# Patient Record
Sex: Female | Born: 1940
Health system: Southern US, Community
[De-identification: ages and names within clinical notes are randomized; demographics above are authoritative.]

## PROBLEM LIST (undated history)

## (undated) DIAGNOSIS — C801 Malignant (primary) neoplasm, unspecified: Secondary | ICD-10-CM

## (undated) DIAGNOSIS — K219 Gastro-esophageal reflux disease without esophagitis: Secondary | ICD-10-CM

## (undated) DIAGNOSIS — M1990 Unspecified osteoarthritis, unspecified site: Secondary | ICD-10-CM

## (undated) DIAGNOSIS — I10 Essential (primary) hypertension: Secondary | ICD-10-CM

## (undated) DIAGNOSIS — M419 Scoliosis, unspecified: Secondary | ICD-10-CM

## (undated) DIAGNOSIS — T7840XA Allergy, unspecified, initial encounter: Secondary | ICD-10-CM

## (undated) DIAGNOSIS — M810 Age-related osteoporosis without current pathological fracture: Secondary | ICD-10-CM

## (undated) DIAGNOSIS — I2510 Atherosclerotic heart disease of native coronary artery without angina pectoris: Secondary | ICD-10-CM

## (undated) DIAGNOSIS — N320 Bladder-neck obstruction: Secondary | ICD-10-CM

## (undated) DIAGNOSIS — K5730 Diverticulosis of large intestine without perforation or abscess without bleeding: Secondary | ICD-10-CM

## (undated) DIAGNOSIS — E785 Hyperlipidemia, unspecified: Secondary | ICD-10-CM

## (undated) DIAGNOSIS — D649 Anemia, unspecified: Secondary | ICD-10-CM

## (undated) DIAGNOSIS — H269 Unspecified cataract: Secondary | ICD-10-CM

## (undated) DIAGNOSIS — M8580 Other specified disorders of bone density and structure, unspecified site: Secondary | ICD-10-CM

## (undated) HISTORY — DX: Scoliosis, unspecified: M41.9

## (undated) HISTORY — DX: Hyperlipidemia, unspecified: E78.5

## (undated) HISTORY — DX: Diverticulosis of large intestine without perforation or abscess without bleeding: K57.30

## (undated) HISTORY — DX: Malignant (primary) neoplasm, unspecified: C80.1

## (undated) HISTORY — DX: Essential (primary) hypertension: I10

## (undated) HISTORY — DX: Atherosclerotic heart disease of native coronary artery without angina pectoris: I25.10

## (undated) HISTORY — DX: Allergy, unspecified, initial encounter: T78.40XA

## (undated) HISTORY — DX: Unspecified cataract: H26.9

## (undated) HISTORY — DX: Gastro-esophageal reflux disease without esophagitis: K21.9

## (undated) HISTORY — DX: Unspecified osteoarthritis, unspecified site: M19.90

## (undated) HISTORY — DX: Other specified disorders of bone density and structure, unspecified site: M85.80

## (undated) HISTORY — DX: Anemia, unspecified: D64.9

## (undated) HISTORY — DX: Age-related osteoporosis without current pathological fracture: M81.0

## (undated) HISTORY — PX: UPPER GASTROINTESTINAL ENDOSCOPY: SHX188

---

## 1969-04-04 HISTORY — PX: VARICOSE VEIN SURGERY: SHX832

## 2000-05-23 ENCOUNTER — Other Ambulatory Visit: Admission: RE | Admit: 2000-05-23 | Discharge: 2000-05-23 | Payer: Self-pay | Admitting: *Deleted

## 2000-06-14 ENCOUNTER — Encounter: Admission: RE | Admit: 2000-06-14 | Discharge: 2000-06-14 | Payer: Self-pay | Admitting: *Deleted

## 2000-06-14 ENCOUNTER — Encounter: Payer: Self-pay | Admitting: *Deleted

## 2000-11-27 ENCOUNTER — Emergency Department (HOSPITAL_COMMUNITY): Admission: EM | Admit: 2000-11-27 | Discharge: 2000-11-27 | Payer: Self-pay | Admitting: Emergency Medicine

## 2003-02-18 ENCOUNTER — Other Ambulatory Visit: Admission: RE | Admit: 2003-02-18 | Discharge: 2003-02-18 | Payer: Self-pay | Admitting: Obstetrics and Gynecology

## 2003-03-11 ENCOUNTER — Encounter: Admission: RE | Admit: 2003-03-11 | Discharge: 2003-03-11 | Payer: Self-pay | Admitting: Internal Medicine

## 2003-03-14 ENCOUNTER — Encounter: Payer: Self-pay | Admitting: Internal Medicine

## 2004-01-26 ENCOUNTER — Encounter: Admission: RE | Admit: 2004-01-26 | Discharge: 2004-01-26 | Payer: Self-pay | Admitting: Internal Medicine

## 2004-01-29 ENCOUNTER — Ambulatory Visit: Payer: Self-pay | Admitting: Internal Medicine

## 2005-01-25 ENCOUNTER — Other Ambulatory Visit: Admission: RE | Admit: 2005-01-25 | Discharge: 2005-01-25 | Payer: Self-pay | Admitting: Obstetrics and Gynecology

## 2005-01-26 ENCOUNTER — Ambulatory Visit: Payer: Self-pay | Admitting: Internal Medicine

## 2005-03-02 ENCOUNTER — Ambulatory Visit: Payer: Self-pay | Admitting: Internal Medicine

## 2005-04-04 ENCOUNTER — Encounter (INDEPENDENT_AMBULATORY_CARE_PROVIDER_SITE_OTHER): Payer: Self-pay | Admitting: *Deleted

## 2005-04-04 HISTORY — PX: COLONOSCOPY: SHX174

## 2005-04-04 LAB — CONVERTED CEMR LAB

## 2005-06-01 ENCOUNTER — Ambulatory Visit: Payer: Self-pay | Admitting: Internal Medicine

## 2005-06-14 ENCOUNTER — Ambulatory Visit: Payer: Self-pay | Admitting: Internal Medicine

## 2005-06-28 ENCOUNTER — Ambulatory Visit: Payer: Self-pay | Admitting: Internal Medicine

## 2006-02-08 ENCOUNTER — Ambulatory Visit: Payer: Self-pay | Admitting: Internal Medicine

## 2006-02-08 LAB — CONVERTED CEMR LAB
ALT: 13 units/L (ref 0–40)
AST: 27 units/L (ref 0–37)
Albumin: 4.4 g/dL (ref 3.5–5.2)
Alkaline Phosphatase: 54 units/L (ref 39–117)
BUN: 16 mg/dL (ref 6–23)
Basophils Absolute: 0 10*3/uL (ref 0.0–0.1)
Basophils Relative: 0.9 % (ref 0.0–1.0)
CO2: 29 meq/L (ref 19–32)
Calcium: 9.5 mg/dL (ref 8.4–10.5)
Chloride: 105 meq/L (ref 96–112)
Chol/HDL Ratio, serum: 4.4
Cholesterol: 248 mg/dL (ref 0–200)
Creatinine, Ser: 0.9 mg/dL (ref 0.4–1.2)
Eosinophil percent: 2 % (ref 0.0–5.0)
GFR calc non Af Amer: 67 mL/min
Glomerular Filtration Rate, Af Am: 81 mL/min/{1.73_m2}
Glucose, Bld: 96 mg/dL (ref 70–99)
HCT: 40.5 % (ref 36.0–46.0)
HDL: 56.2 mg/dL (ref >39.0)
Hemoglobin: 13.6 g/dL (ref 12.0–15.0)
Hgb A1c MFr Bld: 5.4 % (ref 4.6–6.0)
LDL DIRECT: 174.1 mg/dL
Lymphocytes Relative: 24.3 % (ref 12.0–46.0)
MCHC: 33.6 g/dL (ref 30.0–36.0)
MCV: 90.5 fL (ref 78.0–100.0)
Monocytes Absolute: 0.4 10*3/uL (ref 0.2–0.7)
Monocytes Relative: 9 % (ref 3.0–11.0)
Neutro Abs: 2.9 10*3/uL (ref 1.4–7.7)
Neutrophils Relative %: 63.8 % (ref 43.0–77.0)
Platelets: 278 10*3/uL (ref 150–400)
Potassium: 4.6 meq/L (ref 3.5–5.1)
RBC: 4.48 M/uL (ref 3.87–5.11)
RDW: 12.5 % (ref 11.5–14.6)
Sodium: 139 meq/L (ref 135–145)
TSH: 1.69 microintl units/mL (ref 0.35–5.50)
Total Bilirubin: 1 mg/dL (ref 0.3–1.2)
Total Protein: 7.5 g/dL (ref 6.0–8.3)
Triglyceride fasting, serum: 75 mg/dL (ref 0–149)
VLDL: 15 mg/dL (ref 0–40)
WBC: 4.5 10*3/uL (ref 4.5–10.5)

## 2006-02-09 ENCOUNTER — Ambulatory Visit: Payer: Self-pay | Admitting: Internal Medicine

## 2007-05-10 ENCOUNTER — Encounter (INDEPENDENT_AMBULATORY_CARE_PROVIDER_SITE_OTHER): Payer: Self-pay | Admitting: *Deleted

## 2007-05-10 DIAGNOSIS — M810 Age-related osteoporosis without current pathological fracture: Secondary | ICD-10-CM | POA: Insufficient documentation

## 2007-05-10 DIAGNOSIS — M8580 Other specified disorders of bone density and structure, unspecified site: Secondary | ICD-10-CM

## 2007-05-15 ENCOUNTER — Ambulatory Visit: Payer: Self-pay | Admitting: Internal Medicine

## 2007-05-15 DIAGNOSIS — E785 Hyperlipidemia, unspecified: Secondary | ICD-10-CM

## 2007-05-24 ENCOUNTER — Encounter (INDEPENDENT_AMBULATORY_CARE_PROVIDER_SITE_OTHER): Payer: Self-pay | Admitting: *Deleted

## 2007-08-28 ENCOUNTER — Encounter (INDEPENDENT_AMBULATORY_CARE_PROVIDER_SITE_OTHER): Payer: Self-pay | Admitting: *Deleted

## 2007-08-28 ENCOUNTER — Ambulatory Visit: Payer: Self-pay | Admitting: Internal Medicine

## 2007-08-28 LAB — CONVERTED CEMR LAB
OCCULT 2: NEGATIVE
OCCULT 3: NEGATIVE

## 2008-04-04 HISTORY — PX: CATARACT EXTRACTION: SUR2

## 2008-05-19 ENCOUNTER — Ambulatory Visit: Payer: Self-pay | Admitting: Internal Medicine

## 2008-05-19 DIAGNOSIS — K5730 Diverticulosis of large intestine without perforation or abscess without bleeding: Secondary | ICD-10-CM

## 2008-05-26 ENCOUNTER — Encounter (INDEPENDENT_AMBULATORY_CARE_PROVIDER_SITE_OTHER): Payer: Self-pay | Admitting: *Deleted

## 2009-02-25 ENCOUNTER — Encounter: Payer: Self-pay | Admitting: Internal Medicine

## 2009-05-21 ENCOUNTER — Ambulatory Visit: Payer: Self-pay | Admitting: Internal Medicine

## 2009-05-21 DIAGNOSIS — M2550 Pain in unspecified joint: Secondary | ICD-10-CM

## 2009-07-13 ENCOUNTER — Ambulatory Visit: Payer: Self-pay | Admitting: Internal Medicine

## 2010-05-02 LAB — CONVERTED CEMR LAB
ALT: 12 units/L (ref 0–35)
AST: 25 units/L (ref 0–37)
AST: 27 units/L (ref 0–37)
AST: 27 units/L (ref 0–37)
Albumin: 4.2 g/dL (ref 3.5–5.2)
Albumin: 4.7 g/dL (ref 3.5–5.2)
Alkaline Phosphatase: 53 units/L (ref 39–117)
Alkaline Phosphatase: 64 units/L (ref 39–117)
BUN: 13 mg/dL (ref 6–23)
BUN: 15 mg/dL (ref 6–23)
Basophils Absolute: 0 10*3/uL (ref 0.0–0.1)
Basophils Relative: 0.2 % (ref 0.0–3.0)
Basophils Relative: 0.4 % (ref 0.0–3.0)
Bilirubin, Direct: 0.1 mg/dL (ref 0.0–0.3)
CO2: 27 meq/L (ref 19–32)
CO2: 28 meq/L (ref 19–32)
Calcium: 10.3 mg/dL (ref 8.4–10.5)
Calcium: 9.3 mg/dL (ref 8.4–10.5)
Calcium: 9.4 mg/dL (ref 8.4–10.5)
Chloride: 107 meq/L (ref 96–112)
Chloride: 108 meq/L (ref 96–112)
Cholesterol, target level: 200 mg/dL
Cholesterol, target level: 200 mg/dL
Cholesterol: 172 mg/dL (ref 0–200)
Cholesterol: 199 mg/dL (ref 0–200)
Creatinine, Ser: 0.8 mg/dL (ref 0.4–1.2)
Creatinine, Ser: 0.9 mg/dL (ref 0.4–1.2)
Creatinine, Ser: 0.9 mg/dL (ref 0.4–1.2)
Eosinophils Absolute: 0.1 10*3/uL (ref 0.0–0.6)
Eosinophils Absolute: 0.1 10*3/uL (ref 0.0–0.7)
Eosinophils Relative: 2.8 % (ref 0.0–5.0)
GFR calc non Af Amer: 66.22 mL/min (ref >60)
GFR calc non Af Amer: 77 mL/min
Glucose, Bld: 96 mg/dL (ref 70–99)
HCT: 39.4 % (ref 36.0–46.0)
HDL goal, serum: 40 mg/dL
HDL goal, serum: 50 mg/dL
HDL: 54.1 mg/dL (ref >39.0)
Hemoglobin: 14.4 g/dL (ref 12.0–15.0)
LDL Cholesterol: 114 mg/dL — ABNORMAL HIGH (ref 0–99)
LDL Cholesterol: 97 mg/dL (ref 0–99)
LDL Goal: 110 mg/dL
LDL Goal: 160 mg/dL
Lymphocytes Relative: 22.5 % (ref 12.0–46.0)
Lymphocytes Relative: 22.8 % (ref 12.0–46.0)
MCHC: 32.7 g/dL (ref 30.0–36.0)
MCHC: 33.5 g/dL (ref 30.0–36.0)
MCV: 90.5 fL (ref 78.0–100.0)
Monocytes Relative: 8.6 % (ref 3.0–11.0)
Monocytes Relative: 9.1 % (ref 3.0–12.0)
Neutro Abs: 3.4 10*3/uL (ref 1.4–7.7)
Neutrophils Relative %: 65.6 % (ref 43.0–77.0)
Neutrophils Relative %: 65.8 % (ref 43.0–77.0)
Platelets: 249 10*3/uL (ref 150–400)
RBC: 4.34 M/uL (ref 3.87–5.11)
RBC: 4.36 M/uL (ref 3.87–5.11)
RBC: 4.66 M/uL (ref 3.87–5.11)
RDW: 12.3 % (ref 11.5–14.6)
Sodium: 143 meq/L (ref 135–145)
Sodium: 146 meq/L — ABNORMAL HIGH (ref 135–145)
TSH: 1.22 microintl units/mL (ref 0.35–5.50)
TSH: 1.55 microintl units/mL (ref 0.35–5.50)
Total Bilirubin: 0.6 mg/dL (ref 0.3–1.2)
Total CHOL/HDL Ratio: 3.2
Total Protein: 7.6 g/dL (ref 6.0–8.3)
Total Protein: 7.8 g/dL (ref 6.0–8.3)
Triglycerides: 106 mg/dL (ref 0–149)
VLDL: 23 mg/dL (ref 0–40)
WBC: 4.2 10*3/uL — ABNORMAL LOW (ref 4.5–10.5)
WBC: 5 10*3/uL (ref 4.5–10.5)

## 2010-05-04 NOTE — Assessment & Plan Note (Signed)
Summary: severe congestion//lch   Vital Signs:  Patient profile:   70 year old female Weight:      140.2 pounds Temp:     99.1 degrees F Pulse rate:   84 / minute Resp:     16 per minute BP sitting:   130 / 78  (left arm) Cuff size:   regular  Vitals Entered By: Shonna Chock (July 13, 2009 12:27 PM) CC: Congestion x 10days Comments REVIEWED MED LIST, PATIENT AGREED DOSE AND INSTRUCTION CORRECT    CC:  Congestion x 10days.  History of Present Illness: Onset  07/03/2009 as tickle in throat followed  by laryngitis & upper chest , throat & head congestion. Rx: OTC Dayquil, Nyquil Cold & Flu . She had flu shot.  Allergies (verified): No Known Drug Allergies  Review of Systems General:  Complains of chills, fever, and sweats. ENT:  Complains of nasal congestion and sinus pressure; denies ear discharge, earache, and sore throat; No frontal headache or facial pain; some yellow from nose. Resp:  Complains of shortness of breath and wheezing; denies chest pain with inspiration, coughing up blood, and sputum productive; No PMH of asthma or smoking. Allergy:  Denies itching eyes and sneezing.  Physical Exam  General:  Appeara tired but well-nourished,in no acute distress; alert,appropriate and cooperative throughout examination Ears:  External ear exam shows no significant lesions or deformities.  Otoscopic examination reveals clear canals, tympanic membranes are intact bilaterally without bulging, retraction, inflammation or discharge. Hearing is grossly normal bilaterally. Nose:  External nasal examination shows no deformity or inflammation. Nasal mucosa are pink and moist without lesions or exudates. Mouth:  Oral mucosa and oropharynx without lesions or exudates.  Teeth in good repair. Lungs:  Normal respiratory effort, chest expands symmetrically. Lungs : musical rhonchi & expiratory wheezes Heart:  Normal rate and regular rhythm. S1 and S2 normal without gallop, murmur, click, rub  .S4 Cervical Nodes:  No lymphadenopathy noted Axillary Nodes:  No palpable lymphadenopathy   Impression & Recommendations:  Problem # 1:  BRONCHITIS-ACUTE (ICD-466.0)  RAD component present  Her updated medication list for this problem includes:    Amoxicillin-pot Clavulanate 875-125 Mg Tabs (Amoxicillin-pot clavulanate) .Marland Kitchen... 1 every 12 hrs with a meal  Problem # 2:  URI (ICD-465.9)  Complete Medication List: 1)  Multivitamins Tabs (Multiple vitamin) .... Take 1 tablet by mouth once a day 2)  Calcium 1500mg  W/vitamin D 400-800 Iu  .... Take as directed 3)  Black Currant Seed  .... Once daily 4)  Amoxicillin-pot Clavulanate 875-125 Mg Tabs (Amoxicillin-pot clavulanate) .Marland Kitchen.. 1 every 12 hrs with a meal 5)  Prednisone 20 Mg Tabs (Prednisone) .Marland Kitchen.. 1 two times a day with food  Patient Instructions: 1)  Use samples of Advair as prescribed ; 1 inhalation every 12 hrs . Gargle & spit after use.Chest Xray Weds if no better.Neti pot daily as needed for head congestion. 2)  Drink as much fluid as you can tolerate for the next few days. Prescriptions: PREDNISONE 20 MG TABS (PREDNISONE) 1 two times a day with food  #14 x 0   Entered and Authorized by:   Marga Melnick MD   Signed by:   Marga Melnick MD on 07/13/2009   Method used:   Faxed to ...       [facility] [phone]* (retail)       [address] [place], [place]  [address]       Ph: [phone]       Fax: [phone]   RxID:   [id number] AMOXICILLIN-POT CLAVULANATE 875-125 MG TABS (AMOXICILLIN-POT CLAVULANATE) 1 every 12 hrs WITH a meal  #20 x 0   Entered and Authorized by:   Marga Melnick MD   Signed by:   Marga Melnick MD on 07/13/2009   Method used:   Faxed to ...       [facility] [phone]* (retail)       [address] [place], [place]  [address]       Ph: [phone]       Fax: [phone]   RxID:   [id number]

## 2010-05-04 NOTE — Assessment & Plan Note (Signed)
Summary: yearly check/cbs - n/s   Vital Signs:  Patient profile:   70 year old female Height:      65 inches Weight:      141 pounds BMI:     23.55 Temp:     98.6 degrees F oral Pulse rate:   65 / minute Resp:     14 per minute BP sitting:   122 / 70  (left arm) Cuff size:   large  Vitals Entered By: Shonna Chock (May 21, 2009 8:35 AM)  Comments REVIEWED MED LIST, PATIENT AGREED DOSE AND INSTRUCTION CORRECT    History of Present Illness: Carleah D/Ced Pravastatin in 05/2008 due to myalgias ; a friend had ALS & was on a statin. She is on a low fat, sugar & starch diet.  Lipid Management History:      Positive NCEP/ATP III risk factors include female age 74 years old or older.  Negative NCEP/ATP III risk factors include no history of early menopause without estrogen hormone replacement, non-diabetic, HDL cholesterol greater than 60, no family history for ischemic heart disease, non-tobacco-user status, non-hypertensive, no ASHD (atherosclerotic heart disease), no prior stroke/TIA, no peripheral vascular disease, and no history of aortic aneurysm.     Allergies (verified): No Known Drug Allergies  Past History:  Past Medical History: Hyperlipidemia: NMR 2004: LDL 160(1861/981),HDL 46, TG 131. LDL goal = < 110 Osteopenia(last BMD 2009 by Dr Arelia Sneddon, due ); herpes zoster 1997 L flank Diverticulosis, colon  Past Surgical History: Vein stripping (1971) G5 P4 A1 Colonoscopy : Diverticulosis 06-28-2005; due 2017 Cataract extraction OD 02/2009, Dr Elmer Picker  Family History: Father:Esophageal  CA, smoker (died @ 22) Mother: CHF, diverticulosis, arthritis, skin CA,died of CVA @ 37 Siblings: bro excess tobacco & alcohol; P uncle pancreatic CA P Grandmother:  CVA (died @ 57), pre-DM, DVT MGM:  CA, ? stomach; M uncle MI > 16 1st Cousin:  RA Grandson:  ?" hole in heart" Cousin:  Thalassemia  Social History: Never Smoked; Married Alcohol use-yes: socially  Regular exercise-yes:  walks golf course  5X /week  Review of Systems  The patient denies anorexia, fever, weight loss, weight gain, decreased hearing, hoarseness, chest pain, syncope, dyspnea on exertion, peripheral edema, prolonged cough, headaches, hemoptysis, abdominal pain, melena, hematochezia, severe indigestion/heartburn, hematuria, incontinence, suspicious skin lesions, depression, unusual weight change, abnormal bleeding, enlarged lymph nodes, and angioedema.         Vision improved with cataract surgery. MS:  Complains of joint pain; denies joint redness, joint swelling, low back pain, mid back pain, and thoracic pain; Intermittent shoulders, elbows , knees, hips ; NSAIDS as needed .  Physical Exam  General:  well-nourished; alert,appropriate and cooperative throughout examination Head:  Normocephalic and atraumatic without obvious abnormalities. Eyes:  No corneal or conjunctival inflammation noted.Perrla. Funduscopic exam benign, without hemorrhages, exudates or papilledema.  Ears:  External ear exam shows no significant lesions or deformities.  Otoscopic examination reveals clear canals, tympanic membranes are intact bilaterally without bulging, retraction, inflammation or discharge. Hearing is grossly normal bilaterally. Nose:  External nasal examination shows no deformity or inflammation. Nasal mucosa are pink and moist without lesions or exudates. Mouth:  Oral mucosa and oropharynx without lesions or exudates.  Teeth in good repair. Neck:  No deformities, masses, or tenderness noted. Lungs:  Normal respiratory effort, chest expands symmetrically. Lungs are clear to auscultation, no crackles or wheezes. Heart:  Normal rate and regular rhythm. S1 and S2 normal without gallop, murmur, click, rub. S4 with slurring  Abdomen:  Bowel sounds positive,abdomen soft and non-tender without masses, organomegaly or hernias noted. Genitalia:  Dr Arelia Sneddon Msk:  No deformity or scoliosis noted of thoracic or lumbar  spine.   Pulses:  R and L carotid,radial,dorsalis pedis and posterior tibial pulses are full and equal bilaterally Extremities:  No clubbing, cyanosis, edema, or deformity noted with normal full range of motion of all joints.   Minor DIP changes & minor crepitus of knees Neurologic:  alert & oriented X3 and DTRs symmetrical and normal.   Skin:  Intact without suspicious lesions or rashes Cervical Nodes:  No lymphadenopathy noted Axillary Nodes:  No palpable lymphadenopathy Psych:  memory intact for recent and remote, normally interactive, and good eye contact.     Impression & Recommendations:  Problem # 1:  PREVENTIVE HEALTH CARE (ICD-V70.0)  Orders: EKG w/ Interpretation (93000) Venipuncture (69629) TLB-BMP (Basic Metabolic Panel-BMET) (80048-METABOL) TLB-CBC Platelet - w/Differential (85025-CBCD) TLB-Hepatic/Liver Function Pnl (80076-HEPATIC) TLB-TSH (Thyroid Stimulating Hormone) (84443-TSH) T-NMR, Lipoprofile (52841-32440) T-Vitamin D (25-Hydroxy) (10272-53664)  Problem # 2:  HYPERLIPIDEMIA (ICD-272.2)  The following medications were removed from the medication list:    Pravastatin Sodium 40 Mg Tabs (Pravastatin sodium) .Marland Kitchen... Take one tablet at bedtime  Orders: EKG w/ Interpretation (93000) Venipuncture (40347) T-NMR, Lipoprofile (42595-63875)  Problem # 3:  ARTHRALGIA (ICD-719.40)  Problem # 4:  OSTEOPENIA (ICD-733.90)  as per Dr Arelia Sneddon  Orders: Venipuncture [phone]) T-Vitamin D (25-Hydroxy) [id number])  Problem # 5:  DIVERTICULOSIS, COLON (ICD-562.10) as per GI; FOB done @ Gyn negative by history  Complete Medication List: 1)  Multivitamins Tabs (Multiple vitamin) .... Take 1 tablet by mouth once a day 2)  Calcium 1500mg  W/vitamin D 400-800 Iu  .... Take as directed 3)  Black Currant Seed  .... Once daily  Lipid Assessment/Plan:      Based on NCEP/ATP III, the patient's risk factor category is "0-1 risk factors".  The patient's lipid goals are as follows:  Total cholesterol goal is 200; LDL cholesterol goal is 110; HDL cholesterol goal is 50; Triglyceride goal is 150.  Her LDL cholesterol goal has been met.  Secondary causes for hyperlipidemia have been ruled out.  She has been counseled on adjunctive measures for lowering her cholesterol and has been provided with dietary instructions.    Patient Instructions: 1)  NMR Lipoprofile will optimally assess long term risk

## 2010-05-24 ENCOUNTER — Encounter: Payer: Self-pay | Admitting: Internal Medicine

## 2010-05-24 ENCOUNTER — Other Ambulatory Visit: Payer: Self-pay | Admitting: Internal Medicine

## 2010-05-24 ENCOUNTER — Encounter (INDEPENDENT_AMBULATORY_CARE_PROVIDER_SITE_OTHER): Payer: Medicare Other | Admitting: Internal Medicine

## 2010-05-24 DIAGNOSIS — M899 Disorder of bone, unspecified: Secondary | ICD-10-CM

## 2010-05-24 DIAGNOSIS — K5730 Diverticulosis of large intestine without perforation or abscess without bleeding: Secondary | ICD-10-CM

## 2010-05-24 DIAGNOSIS — R0609 Other forms of dyspnea: Secondary | ICD-10-CM | POA: Insufficient documentation

## 2010-05-24 DIAGNOSIS — R002 Palpitations: Secondary | ICD-10-CM | POA: Insufficient documentation

## 2010-05-24 DIAGNOSIS — Z0000 Encounter for general adult medical examination without abnormal findings: Secondary | ICD-10-CM

## 2010-05-24 DIAGNOSIS — M949 Disorder of cartilage, unspecified: Secondary | ICD-10-CM

## 2010-05-24 DIAGNOSIS — E782 Mixed hyperlipidemia: Secondary | ICD-10-CM

## 2010-05-24 DIAGNOSIS — Z23 Encounter for immunization: Secondary | ICD-10-CM

## 2010-05-24 DIAGNOSIS — E785 Hyperlipidemia, unspecified: Secondary | ICD-10-CM

## 2010-05-24 DIAGNOSIS — D485 Neoplasm of uncertain behavior of skin: Secondary | ICD-10-CM | POA: Insufficient documentation

## 2010-05-24 DIAGNOSIS — R0989 Other specified symptoms and signs involving the circulatory and respiratory systems: Secondary | ICD-10-CM

## 2010-05-24 LAB — LIPID PANEL
HDL: 62.8 mg/dL (ref >39.00)
VLDL: 21.8 mg/dL (ref 0.0–40.0)

## 2010-05-24 LAB — CBC WITH DIFFERENTIAL/PLATELET
Eosinophils Relative: 2.5 % (ref 0.0–5.0)
HCT: 39.9 % (ref 36.0–46.0)
Hemoglobin: 13.6 g/dL (ref 12.0–15.0)
Lymphs Abs: 0.8 10*3/uL (ref 0.7–4.0)
Monocytes Relative: 7.5 % (ref 3.0–12.0)
Neutro Abs: 4 10*3/uL (ref 1.4–7.7)
WBC: 5.4 10*3/uL (ref 4.5–10.5)

## 2010-05-24 LAB — TSH: TSH: 1.75 u[IU]/mL (ref 0.35–5.50)

## 2010-05-24 LAB — HEPATIC FUNCTION PANEL
ALT: 12 U/L (ref 0–35)
AST: 21 U/L (ref 0–37)
Albumin: 4.5 g/dL (ref 3.5–5.2)
Total Bilirubin: 0.6 mg/dL (ref 0.3–1.2)

## 2010-05-24 LAB — BASIC METABOLIC PANEL
GFR: 67.75 mL/min (ref >60.00)
Potassium: 4.9 mEq/L (ref 3.5–5.1)
Sodium: 142 mEq/L (ref 135–145)

## 2010-06-01 ENCOUNTER — Encounter: Payer: Self-pay | Admitting: Cardiology

## 2010-06-01 ENCOUNTER — Encounter: Payer: Self-pay | Admitting: Physician Assistant

## 2010-06-01 ENCOUNTER — Encounter (INDEPENDENT_AMBULATORY_CARE_PROVIDER_SITE_OTHER): Payer: Medicare Other | Admitting: Physician Assistant

## 2010-06-01 DIAGNOSIS — R9431 Abnormal electrocardiogram [ECG] [EKG]: Secondary | ICD-10-CM | POA: Insufficient documentation

## 2010-06-01 DIAGNOSIS — R0602 Shortness of breath: Secondary | ICD-10-CM

## 2010-06-01 NOTE — Assessment & Plan Note (Signed)
Summary: cpx/kn   Vital Signs:  Patient profile:   70 year old female Height:      64.75 inches Weight:      142 pounds BMI:     23.90 Temp:     97.7 degrees F oral Pulse rate:   72 / minute Resp:     14 per minute BP sitting:   116 / 78  (left arm) Cuff size:   large  Vitals Entered By: Shonna Chock CMA (May 24, 2010 9:38 AM) CC: CPX with fasting labs , Lipid Management  Vision Screening:Left eye w/o correction: 20 / 40 Right Eye w/o correction: 20 / 30 Both eyes w/o correction:  20/ 30       Vision Comments: Implant-right eye   Vision Entered By: Shonna Chock CMA (May 24, 2010 9:36 AM)   CC:  CPX with fasting labs  and Lipid Management.  History of Present Illness: Here for Medicare AWV: 1.Risk factors based on Past M, S, F history:see Diagnoses ; chart updated 2.Physical Activities: walks golf course 2-5X/week 3.Depression/mood: no issues 4.Hearing: whisper heard @ 6 ft 5.ADL's: no limitations 6.Fall Risk: no issues 7.Home Safety: safety proofed 8.Height, weight, &visual acuity:see VS 9.Counseling: POA & Living Will in place 10.Labs ordered based on risk factors: see Orders 11. Referral Coordination: mammograms & BMD up to date; Flu in Fall 12. Care Plan: see Instructions 13.Cognitive Assessment: Oriented X 3 ; memory & recall  excellent   ; "WORLD" spelled backwards; mood & affect normal.    Hyperlipidemia Follow-Up:   She notes  dypsnea and palpitations going up hill on the golf course.  The patient denies the following symptoms: chest pain/pressure, exercise intolerance, syncope, and pedal edema.  Dietary compliance has been good.  Adjunctive measures currently used by the patient include fiber, folic acid, and niacin.  She had arm pain on Pravastatin.  Lipid Management History:      Positive NCEP/ATP III risk factors include female age 85 years old or older.  Negative NCEP/ATP III risk factors include no history of early menopause without estrogen  hormone replacement, non-diabetic, HDL cholesterol greater than 60, no family history for ischemic heart disease, non-tobacco-user status, non-hypertensive, no ASHD (atherosclerotic heart disease), no prior stroke/TIA, no peripheral vascular disease, and no history of aortic aneurysm.     Preventive Screening-Counseling & Management  Alcohol-Tobacco     Alcohol drinks/day: 0     Smoking Status: never  Caffeine-Diet-Exercise     Caffeine use/day: 3 glasses of tea  Hep-HIV-STD-Contraception     Dental Visit-last 6 months yes     Sun Exposure-Excessive: no  Safety-Violence-Falls     Seat Belt Use: yes     Smoke Detectors: yes      Blood Transfusions:  no.        Travel History:  11/ 2011 [place].    Allergies (verified): 1)  ! Pravastatin Sodium (Pravastatin Sodium)  Past History:  Past Medical History: Hyperlipidemia: NMR 2004: LDL 160 (1861/981),HDL 46, TG 131. LDL goal = < 120. Framingham Study LDL goal = < 160. Osteopenia (last BMD 2011 by Dr Arelia Sneddon ); herpes zoster 1997 L flank Diverticulosis, colon  Past Surgical History: Vein stripping (1971) G5 P4 A1 Colonoscopy : Diverticulosis 06/2005; due 2017 Cataract extraction OD 02/2009, Dr Elmer Picker  Family History: Father:Esophageal  cancer , smoker (died @ 64) Mother: CHF, diverticulosis, arthritis, skin cancer ,died of CVA @ 72 Siblings: bro ::excess tobacco & alcohol; P uncle pancreatic cancer P Grandmother:  CVA (died @ 32), pre-DM, DVT MGM:  cancer , ? stomach; M uncle: MI > 15 1st Cousin:  RA Grandson:?" hole in heart" Cousin:  Thalassemia  Social History: Never Smoked; Married Alcohol use-yes: extremely rarely  Regular exercise-yes: walks golf course 2- 5X /week(variable due to season) Caffeine use/day:  3 glasses of tea Dental Care w/in 6 mos.:  yes Sun Exposure-Excessive:  no Seat Belt Use:  yes Blood Transfusions:  no  Review of Systems       The patient complains of suspicious skin lesions.   The patient denies anorexia, fever, weight loss, weight gain, vision loss, decreased hearing, hoarseness, prolonged cough, hemoptysis, melena, hematochezia, severe indigestion/heartburn, hematuria, unusual weight change, abnormal bleeding, enlarged lymph nodes, and angioedema.         Mucus in stool "50%" of time. Intermittent pigmented lesion 3rd R fingernail base; it resolves completely.  Physical Exam  General:  well-nourished,in no acute distress; alert,appropriate and cooperative throughout examination Head:  Normocephalic and atraumatic without obvious abnormalities.  Eyes:  No corneal or conjunctival inflammation noted.Perrla. Funduscopic exam benign, without hemorrhages, exudates or papilledema.  Ears:  External ear exam shows no significant lesions or deformities.  Otoscopic examination reveals clear canals, tympanic membranes are intact bilaterally without bulging, retraction, inflammation or discharge. Hearing is grossly normal bilaterally. Nose:  External nasal examination shows no deformity or inflammation. Nasal mucosa are pink and moist without lesions or exudates. Mouth:  Oral mucosa and oropharynx without lesions or exudates.  Teeth in good repair. Neck:  No deformities, masses, or tenderness noted. Lungs:  Normal respiratory effort, chest expands symmetrically. Lungs are clear to auscultation, no crackles or wheezes. Heart:  Normal rate and regular rhythm. S1 and S2 normal without gallop, murmur, click, rub .S4 Abdomen:  Bowel sounds positive,abdomen soft and non-tender without masses, organomegaly or hernias noted. Genitalia:  Dr Arelia Sneddon Msk:  No deformity or scoliosis noted of thoracic or lumbar spine.   Pulses:  R and L carotid,radial,dorsalis pedis and posterior tibial pulses are full and equal bilaterally Extremities:  No clubbing, cyanosis, edema. Minor OA finger changes; normal full range of motion of all joints.  Punctate pigmented lesion @ 3rd R fingernail  Neurologic:   alert & oriented X3 and DTRs symmetrical and normal.   Skin:  Intact without  rashes; see finger Cervical Nodes:  No lymphadenopathy noted Axillary Nodes:  No palpable lymphadenopathy Psych:  memory intact for recent and remote, normally interactive, and good eye contact.     Impression & Recommendations:  Problem # 1:  PREVENTIVE HEALTH CARE (ICD-V70.0)  Orders: Medicare -1st Annual Wellness Visit [phone])  Problem # 2:  DYSPNEA/SHORTNESS OF BREATH (ICD-786.09)  DOE with # 3  Orders: EKG w/ Interpretation (93000)  Problem # 3:  PALPITATIONS (ICD-785.1)  exertional  Orders: EKG w/ Interpretation (93000)  Problem # 4:  NEOPLASM, SKIN, UNCERTAIN BEHAVIOR (ICD-238.2)  Orders: Dermatology Referral (Derma)  Problem # 5:  HYPERLIPIDEMIA (ICD-272.2)  Orders: EKG w/ Interpretation (93000) Venipuncture (60454) TLB-Lipid Panel (80061-LIPID) TLB-BMP (Basic Metabolic Panel-BMET) (80048-METABOL) TLB-Hepatic/Liver Function Pnl (80076-HEPATIC) Specimen Handling (09811)  Problem # 6:  OSTEOPENIA (ICD-733.90)  Orders: Venipuncture (91478) TLB-TSH (Thyroid Stimulating Hormone) (84443-TSH) T-Vitamin D (25-Hydroxy) (29562-13086) Specimen Handling (57846)  Problem # 7:  DIVERTICULOSIS, COLON (ICD-562.10)  Orders: Venipuncture (96295) TLB-CBC Platelet - w/Differential (85025-CBCD) Specimen Handling (28413)  Complete Medication List: 1)  Multivitamins Tabs (Multiple vitamin) .... Take 1 tablet by mouth once a day 2)  Calcium 1500mg  W/vitamin D 400-800 Iu  .Marland KitchenMarland KitchenMarland Kitchen  Take as directed 3)  Black Currant Seed  .... Once daily  Other Orders: Tdap => 67yrs IM ([address]) Admin 1st Vaccine (60454)  Lipid Assessment/Plan:      Based on NCEP/ATP III, the patient's risk factor category is "0-1 risk factors".  The patient's lipid goals are as follows: Total cholesterol goal is 200; LDL cholesterol goal is 120; HDL cholesterol goal is 50; Triglyceride goal is 150.  Her LDL cholesterol goal has  been met.  Secondary causes for hyperlipidemia have been ruled out.  She has been counseled on adjunctive measures for lowering her cholesterol and has been provided with dietary instructions.    Patient Instructions: 1)  Please see Dr Danella Deis; referral will be made. A stress test may be considered after review of labs & EKG toevaluate the exercise related symptoms   Orders Added: 1)  Medicare -1st Annual Wellness Visit [G0438] 2)  Est. Patient Level III [09811] 3)  EKG w/ Interpretation [93000] 4)  Venipuncture [36415] 5)  TLB-Lipid Panel [80061-LIPID] 6)  TLB-BMP (Basic Metabolic Panel-BMET) [80048-METABOL] 7)  TLB-CBC Platelet - w/Differential [85025-CBCD] 8)  TLB-Hepatic/Liver Function Pnl [80076-HEPATIC] 9)  TLB-TSH (Thyroid Stimulating Hormone) [84443-TSH] 10)  T-Vitamin D (25-Hydroxy) [91478-29562] 11)  Specimen Handling [99000] 12)  Tdap => 56yrs IM [90715] 13)  Admin 1st Vaccine [90471] 14)  Dermatology Referral [Derma]   Immunizations Administered:  Tetanus Vaccine:    Vaccine Type: Tdap    Site: left deltoid    Mfr: GlaxoSmithKline    Dose: 0.5 ml    Route: IM    Given by: Shonna Chock CMA    Exp. Date: 01/22/2012    Lot #: [id number]    VIS given: 02/20/08 version given May 24, 2010.   Immunizations Administered:  Tetanus Vaccine:    Vaccine Type: Tdap    Site: left deltoid    Mfr: GlaxoSmithKline    Dose: 0.5 ml    Route: IM    Given by: Shonna Chock CMA    Exp. Date: 01/22/2012    Lot #: [id number]    VIS given: 02/20/08 version given May 24, 2010.

## 2010-06-01 NOTE — Miscellaneous (Signed)
Summary: Orders Update  Clinical Lists Changes  Orders: Added new Referral order of Misc. Referral (Misc. Ref) - Signed 

## 2010-06-10 ENCOUNTER — Other Ambulatory Visit (INDEPENDENT_AMBULATORY_CARE_PROVIDER_SITE_OTHER): Payer: Medicare Other

## 2010-06-10 ENCOUNTER — Other Ambulatory Visit: Payer: Self-pay | Admitting: Cardiology

## 2010-06-10 ENCOUNTER — Encounter: Payer: Self-pay | Admitting: Cardiology

## 2010-06-10 DIAGNOSIS — R0789 Other chest pain: Secondary | ICD-10-CM

## 2010-06-10 DIAGNOSIS — R079 Chest pain, unspecified: Secondary | ICD-10-CM

## 2010-06-10 LAB — CBC WITH DIFFERENTIAL/PLATELET
Basophils Absolute: 0 10*3/uL (ref 0.0–0.1)
Basophils Relative: 0.6 % (ref 0.0–3.0)
Eosinophils Absolute: 0.2 10*3/uL (ref 0.0–0.7)
Eosinophils Relative: 2.4 % (ref 0.0–5.0)
HCT: 37.7 % (ref 36.0–46.0)
Hemoglobin: 12.9 g/dL (ref 12.0–15.0)
Lymphocytes Relative: 14.3 % (ref 12.0–46.0)
Lymphs Abs: 1 10*3/uL (ref 0.7–4.0)
MCHC: 34.2 g/dL (ref 30.0–36.0)
MCV: 90.5 fl (ref 78.0–100.0)
Monocytes Absolute: 0.5 10*3/uL (ref 0.1–1.0)
Monocytes Relative: 6.5 % (ref 3.0–12.0)
Neutro Abs: 5.3 10*3/uL (ref 1.4–7.7)
Neutrophils Relative %: 76.2 % (ref 43.0–77.0)
Platelets: 251 10*3/uL (ref 150.0–400.0)
RBC: 4.17 Mil/uL (ref 3.87–5.11)
RDW: 13.3 % (ref 11.5–14.6)
WBC: 6.9 10*3/uL (ref 4.5–10.5)

## 2010-06-10 LAB — BASIC METABOLIC PANEL
BUN: 12 mg/dL (ref 6–23)
CO2: 27 mEq/L (ref 19–32)
Calcium: 9.1 mg/dL (ref 8.4–10.5)
Chloride: 108 mEq/L (ref 96–112)
Creatinine, Ser: 0.8 mg/dL (ref 0.4–1.2)
GFR: 72.48 mL/min (ref >60.00)
Glucose, Bld: 90 mg/dL (ref 70–99)
Potassium: 4.4 mEq/L (ref 3.5–5.1)
Sodium: 142 mEq/L (ref 135–145)

## 2010-06-10 LAB — PROTIME-INR: INR: 1 ratio (ref 0.8–1.0)

## 2010-06-10 NOTE — Letter (Signed)
Summary: Cardiac Catheterization Instructions- [facility]  [facility], Main Office  [address]   [place], [place] [address]   Phone: [phone]  Fax: [phone]     06/01/2010 MRN: [id number]  [facility] [address] [place], [place]  [address]  [place]  Dear Ms. Braaksma,   You are scheduled for a Cardiac Catheterization on Tuesday March 13,2012 with Dr.Jeyden Coffelt Shirlee Latch.  Please arrive to the 1st floor of the [facility] and V[facility] Center at [facility] at 8:30 am  on the day of your procedure. Please do not arrive before 6:30 a.m. Call the Heart and Vascular Center at [phone] if you are unable to make your appointmnet. The Code to get into the parking garage under the building is 3000. Take the elevators to the 1st floor. You must have someone to drive you home. Someone must be with you for the first 24 hours after you arrive home. Please wear clothes that are easy to get on and off and wear slip-on shoes. Do not eat or drink after midnight except water with your medications that morning. Bring all your medications and current insurance cards with you.   COME TO THE  [facility] OFFICE FOR LAB THURSDAY MARCH [phone]. THE LAB OPENS AT 8:30AM.  _x__ Make sure you take your aspirin.  _x__ You may take ALL of your medications with water that morning.   The usual length of stay after your procedure is 2 to 3 hours. This can vary.  If you have any questions, please call the office at the number listed above.   Katina Dung, RN, BSN

## 2010-06-15 ENCOUNTER — Inpatient Hospital Stay (HOSPITAL_BASED_OUTPATIENT_CLINIC_OR_DEPARTMENT_OTHER)
Admission: RE | Admit: 2010-06-15 | Discharge: 2010-06-15 | Disposition: A | Discharge status: Home / Self Care | Payer: Medicare Other | Source: Ambulatory Visit | Attending: Cardiology | Admitting: Cardiology

## 2010-06-15 DIAGNOSIS — R0602 Shortness of breath: Secondary | ICD-10-CM | POA: Insufficient documentation

## 2010-06-15 DIAGNOSIS — I2510 Atherosclerotic heart disease of native coronary artery without angina pectoris: Secondary | ICD-10-CM

## 2010-06-15 DIAGNOSIS — R0609 Other forms of dyspnea: Secondary | ICD-10-CM | POA: Insufficient documentation

## 2010-06-15 DIAGNOSIS — E785 Hyperlipidemia, unspecified: Secondary | ICD-10-CM | POA: Insufficient documentation

## 2010-06-15 DIAGNOSIS — R0989 Other specified symptoms and signs involving the circulatory and respiratory systems: Secondary | ICD-10-CM | POA: Insufficient documentation

## 2010-06-16 ENCOUNTER — Encounter: Payer: Self-pay | Admitting: Cardiology

## 2010-06-16 ENCOUNTER — Telehealth: Payer: Self-pay | Admitting: Cardiology

## 2010-06-17 ENCOUNTER — Encounter: Payer: Self-pay | Admitting: Cardiology

## 2010-06-17 ENCOUNTER — Ambulatory Visit (INDEPENDENT_AMBULATORY_CARE_PROVIDER_SITE_OTHER): Payer: Medicare Other | Admitting: Cardiology

## 2010-06-17 DIAGNOSIS — I2510 Atherosclerotic heart disease of native coronary artery without angina pectoris: Secondary | ICD-10-CM | POA: Insufficient documentation

## 2010-06-17 NOTE — Procedures (Signed)
[name], [name]                 ACCOUNT NO.:  [id number]  MEDICAL RECORD NO.:  [id number]           PATIENT TYPE:  O  LOCATION:  CATH                         FACILITY:  [facility]  PHYSICIAN:  Marca Ancona, MD      DATE OF BIRTH:  23-Jul-1940  DATE OF PROCEDURE:  06/15/2010 DATE OF DISCHARGE:                           CARDIAC CATHETERIZATION   PROCEDURES: 1. Left heart catheterization. 2. Coronary angiography. 3. Left ventriculography.  OPERATOR:  Marca Ancona, MD  INDICATIONS:  This is a 70 year old who has a history of hyperlipidemia. For the last couple of months, she has noted decreased stamina.  She gets short of breath walking [place].  No rest symptoms.  No true chest pain.  The patient did have an exercise treadmill test done.  This was abnormal showing ST depression in multiple leads as well as a hypertensive blood pressure response.  The patient is brought in today for left heart catheterization today to define her coronary disease.  PROCEDURE TECHNIQUE:  After the patient gave informed consent, the right groin was sterilely prepped and draped.  Lidocaine 1% was used to locally anesthetize the right groin area.  The right common femoral artery was entered using modified Seldinger technique and a 4-French arterial sheath was placed.  The right coronary artery was engaged using the [facility] catheter.  The left coronary artery was engaged with the JL-4 catheter and was engaged better with JL-5 catheter.  The left ventricle was entered using an angled pigtail catheter.  There were no complications.  HEMODYNAMIC FINDINGS: 1. LV 133/14, aorta 137/62. 2. Left ventriculography:  EF was estimated to be 60%.  The basal     inferior wall was severely hypokinetic to akinetic.  The remainder     of the LV myocardium retracted normally. 3. Right coronary artery:  There was subtotal occlusion of the proximal     right coronary artery.  There were good  left-to-right collaterals     that filled the PDA, PLV, and distal RCA.  These collaterals came     from the circumflex and the LAD systems. 4. Left main:  There is no angiographic disease in the left main. 5. Left circumflex system:  There was a large first obtuse marginal,     small AV circumflex, and minimal disease. 6. LAD system:  There was a small first diagonal.  Shortly after that,     there was a moderate-sized second diagonal.  At     the takeoff of the second diagonal, there is about an 80% stenosis.     This was discrete.  There was also about an 80% ostial stenosis in     the moderate-sized second diagonal.  The remainder of the LAD had     minimal disease.  IMPRESSION:  This is a 70 year old with history hyperlipidemia as well as exertional dyspnea who had an abnormal exercise treadmill test.  Left heart catheterization showed subtotal occlusion of the right coronary artery with good left-to-right collaterals.  There was also an 80% proximal left anterior descending stenosis at the takeoff of the moderate-sized  second diagonal as well as an 80% ostial second diagonal stenosis.  I have discussed the patient's situation with our interventional team.  She has anatomy that would be suitable for CABG.  However, intervention is also an option. I think that if we were to choose to intervene, she would need a drug-eluting  stent in the LAD but also an attempt at opening the chronic total occlusion in the RCA.  To open the LAD alone would leave a large territory at risk if the LAD stent were ever to be compromised.  I will have the patient back to the office and we will  discuss PCI versus CABG.      Marca Ancona, MD     DM/MEDQ  D:  06/15/2010  T:  06/16/2010  Job:  [id number]  cc:   Titus Dubin. Alwyn Ren, MD,FACP,FCCP  Electronically Signed by Marca Ancona MD on 06/17/2010 11:54:27 PM

## 2010-06-18 ENCOUNTER — Observation Stay (HOSPITAL_COMMUNITY)
Admission: RE | Admit: 2010-06-18 | Discharge: 2010-06-19 | Disposition: A | Discharge status: Home / Self Care | Payer: Medicare Other | Source: Ambulatory Visit | Attending: Cardiovascular Disease | Admitting: Cardiovascular Disease

## 2010-06-18 DIAGNOSIS — M949 Disorder of cartilage, unspecified: Secondary | ICD-10-CM | POA: Insufficient documentation

## 2010-06-18 DIAGNOSIS — E785 Hyperlipidemia, unspecified: Secondary | ICD-10-CM | POA: Insufficient documentation

## 2010-06-18 DIAGNOSIS — Z01812 Encounter for preprocedural laboratory examination: Secondary | ICD-10-CM | POA: Insufficient documentation

## 2010-06-18 DIAGNOSIS — K5730 Diverticulosis of large intestine without perforation or abscess without bleeding: Secondary | ICD-10-CM | POA: Insufficient documentation

## 2010-06-18 DIAGNOSIS — R0609 Other forms of dyspnea: Secondary | ICD-10-CM | POA: Insufficient documentation

## 2010-06-18 DIAGNOSIS — I2582 Chronic total occlusion of coronary artery: Secondary | ICD-10-CM | POA: Insufficient documentation

## 2010-06-18 DIAGNOSIS — R9439 Abnormal result of other cardiovascular function study: Secondary | ICD-10-CM | POA: Insufficient documentation

## 2010-06-18 DIAGNOSIS — Z01810 Encounter for preprocedural cardiovascular examination: Secondary | ICD-10-CM | POA: Insufficient documentation

## 2010-06-18 DIAGNOSIS — I2510 Atherosclerotic heart disease of native coronary artery without angina pectoris: Secondary | ICD-10-CM

## 2010-06-18 DIAGNOSIS — M899 Disorder of bone, unspecified: Secondary | ICD-10-CM | POA: Insufficient documentation

## 2010-06-18 DIAGNOSIS — R0989 Other specified symptoms and signs involving the circulatory and respiratory systems: Secondary | ICD-10-CM | POA: Insufficient documentation

## 2010-06-18 LAB — BASIC METABOLIC PANEL
Calcium: 9 mg/dL (ref 8.4–10.5)
Chloride: 110 mEq/L (ref 96–112)
Creatinine, Ser: 0.83 mg/dL (ref 0.4–1.2)
GFR calc Af Amer: >60 mL/min (ref >60)
GFR calc non Af Amer: >60 mL/min (ref >60)

## 2010-06-18 LAB — POCT ACTIVATED CLOTTING TIME: Activated Clotting Time: 434 seconds

## 2010-06-18 LAB — CBC
MCH: 29.5 pg (ref 26.0–34.0)
Platelets: 231 10*3/uL (ref 150–400)
RBC: 4.17 MIL/uL (ref 3.87–5.11)
RDW: 13 % (ref 11.5–15.5)

## 2010-06-19 HISTORY — PX: CORONARY ANGIOPLASTY WITH STENT PLACEMENT: SHX49

## 2010-06-19 LAB — CBC
HCT: 35.3 % — ABNORMAL LOW (ref 36.0–46.0)
Hemoglobin: 11.5 g/dL — ABNORMAL LOW (ref 12.0–15.0)
RBC: 3.92 MIL/uL (ref 3.87–5.11)
WBC: 6.2 10*3/uL (ref 4.0–10.5)

## 2010-06-19 LAB — BASIC METABOLIC PANEL
BUN: 14 mg/dL (ref 6–23)
CO2: 25 mEq/L (ref 19–32)
Calcium: 8.4 mg/dL (ref 8.4–10.5)
Chloride: 109 mEq/L (ref 96–112)
Creatinine, Ser: 0.82 mg/dL (ref 0.4–1.2)
GFR calc Af Amer: >60 mL/min (ref >60)
GFR calc non Af Amer: >60 mL/min (ref >60)
Glucose, Bld: 103 mg/dL — ABNORMAL HIGH (ref 70–99)
Potassium: 3.6 mEq/L (ref 3.5–5.1)
Sodium: 137 mEq/L (ref 135–145)

## 2010-06-19 LAB — URINALYSIS, ROUTINE W REFLEX MICROSCOPIC
Bilirubin Urine: NEGATIVE
Nitrite: POSITIVE — AB
Specific Gravity, Urine: 1.023 (ref 1.005–1.030)
Urobilinogen, UA: 0.2 mg/dL (ref 0.0–1.0)
pH: 6 (ref 5.0–8.0)

## 2010-06-19 LAB — URINE MICROSCOPIC-ADD ON

## 2010-06-20 NOTE — Discharge Summary (Addendum)
Natalie, Fuller                 ACCOUNT NO.:  [id number]  MEDICAL RECORD NO.:  [id number]           PATIENT TYPE:  O  LOCATION:  6531                         FACILITY:  [facility]  PHYSICIAN:  Gerrit Friends. Dietrich Pates, MD, FACCDATE OF BIRTH:  22-Mar-1941  DATE OF ADMISSION:  06/18/2010 DATE OF DISCHARGE:  06/19/2010                              DISCHARGE SUMMARY   DISCHARGE DIAGNOSES: 1. Dyspnea on exertion with an abnormal exercise treadmill test with     newly diagnosed coronary artery disease by cath prior to admission     in June 16, 2010. 2. Coronary artery disease, status post percutaneous transluminal     coronary angioplasty/stenting of the right carotid artery chronic     occlusion and percutaneous transluminal coronary angioplasty/drug-     eluting stent placement to the mid left anterior descending. 3. Ejection fraction of 60% by cath on June 16, 2010, with inferior     basal hypokinesis. 4. Hyperlipidemia. 5. Diverticulosis of the colon. 6. Osteopenia. 7. Remote history of herpes zoster.  HOSPITAL COURSE:  [name] is a 70 year old pleasant female with a history of hyperlipidemia, who was seen by her PCP for recent increase in dyspnea on exertion, mostly while [place].  The patient has an exercise treadmill test done showing ST depression in multiple leads as well as hypertensive blood pressure response.  She was brought in for diagnostic catheterization on June 16, 2010, showing subtotal occlusion in the RCA with good left-to-right collaterals as well as 80% proximal LAD at the takeoff of a moderate- sized diagonal was 80% ostial second diagonal stenosis.  She was felt to have anatomy that was significant for CABG, however, intervention was also felt to be an option.  Dr. Shirlee Latch reviewed the films extensively with the intervention with colleagues and ultimately revascularization using PCI was felt her best option.  She was brought back in  the hospital on June 18, 2010 for this procedure and underwent successful stenting of the RCA, chronic occlusion as well as mid LAD with Promus drug-eluting stent.  The patient tolerated the procedure well without difficulty.  She did have a small piece of hematoma without bruits or separation.  Nursing did report that she had foul smelling urine this morning, so that will be sent off for UA.  Dr. Dietrich Pates has seen and examined her today and felt that she is stable for discharge.  DISCHARGE LABS:  WBC is 6.2, hemoglobin 11.5, hematocrit 35.3, platelet 218, sodium 137, potassium 3.6, chloride 109, CO2 of 25, glucose 103, BUN of 14, and creatinine 1.82.  STUDIES:  Cardiac catheterization on June 18, 2010, please see full report for details as well as hospital course summary.  DISCHARGE MEDICATIONS: 1. Nitroglycerin 0.4 mg sublingual every 5 minutes as needed, up to 3     doses. 2. Aspirin 81 mg daily. 3. Calcium carbonate/vitamin D over-the-counter 1 tablet daily. 4. Crestor 5 mg daily. 5. Metoprolol titrate 25 mg b.i.d. 6. Motrin 200 mg 2 tablet daily as needed with instructions only use     sparingly as there is increased risk  of stomach bleeding while     taking medicines like aspirin and Plavix. 7. Multivitamin 1 tablet daily. 8. Plavix 75 mg daily. 9. Blackcurrant seed oil 1 tablet daily. 10.Coenzyme Q10 200 mg daily.  With history of intolerance to Prevacid secondary to myalgias, I initiated on low-dose Crestor by Dr. Shirlee Latch.  DISPOSITION:  [name] to be discharged in stable condition to home. She is instructed not to lift anything for 1 week, participate sexual activity for 1 week, or drive for 2 days.  She is to follow a low-sodium heart-healthy diet.  If she notices any pain, swelling, bleeding or pus at cath site, she is to call or return.  I will be following up with her UA result later today, after her discharge, I will give her a call once those are in and call  in if a prescription if that should warrant antibiotic therapy.  She will follow up with Dr. Shirlee Latch in 2 weeks and our office will call her with this appointment.  DURATION OF DISCHARGE ENCOUNTER:  Greater than 30 minutes including physician and PA time.  ADDENDUM: The pt's UA showed positive nitrite and she was therefore given a prescription for Cipro 250mg  po bid x 3 days. This prescription & addendum were written after the timeframe that the med rec was able to be completed (after discharge once the UA  resulted), which is why it is not on the medication reconcilliation form.  Dayna Dunn, P.A.C.   ______________________________ Gerrit Friends. Dietrich Pates, MD, [facility]    DD/MEDQ  D:  06/19/2010  T:  06/20/2010  Job:  [id number]  cc:   Marca Ancona, MD Titus Dubin. Alwyn Ren, MD,FACP,FCCP  Electronically Signed by Ronie Spies  on 06/20/2010 02:03:46 PM Electronically Signed by [place] Bing MD [facility] on 06/23/2010 06:44:04 PM

## 2010-06-22 ENCOUNTER — Telehealth: Payer: Self-pay | Admitting: Cardiology

## 2010-06-22 ENCOUNTER — Encounter (INDEPENDENT_AMBULATORY_CARE_PROVIDER_SITE_OTHER): Payer: Medicare Other | Admitting: *Deleted

## 2010-06-22 DIAGNOSIS — R1909 Other intra-abdominal and pelvic swelling, mass and lump: Secondary | ICD-10-CM

## 2010-06-22 DIAGNOSIS — M79609 Pain in unspecified limb: Secondary | ICD-10-CM

## 2010-06-22 NOTE — Assessment & Plan Note (Signed)
Summary: f/u cath done 06/15/10/ok per anne/sl rs per pt call=mj   Visit Type:  Follow cath Primary Provider:  Dr. Alwyn Ren   History of Present Illness: 70 yo with history of hyperlipidemia and CAD returns after outpatient cath today to discuss revascularization options.  Patient had noted dyspnea when walking [place] the [place] for the last 4-5 weeks.  This is new for her.  On further questioning after cath, she does report an episode of severe shortness of breath at rest that occurred around a month ago.  She was set up for an ETT.  This showed below average exercise tolerance and diffuse ST depression in multiple leads.  She was set up for LHC, which I did earlier this week.  This showed 80% stenosis of the proximal LAD at D2, 80% ostial D2, and subtotal occlusion of the proximal RCA with left to right collaterals.  Patient returns today with her family to discuss revascularization approach.  After cath, I started her on Plavix as well as on a low dose of Crestor (has had myalgias with statins in the past).    Current Medications (verified): 1)  Multivitamins   Tabs (Multiple Vitamin) .... Take 1 Tablet By Mouth Once A Day 2)  Calcium 1500mg  W/vitamin D 400-800 Iu .... Take As Directed 3)  Black Currant Seed .... Once Daily 4)  Nitrostat 0.4 Mg Subl (Nitroglycerin) .Marland Kitchen.. 1 Under Your Tongue As Needed For Chest Pain 5)  Metoprolol Tartrate 25 Mg Tabs (Metoprolol Tartrate) .... One Twice A Day 6)  Aspirin 81 Mg Tbec (Aspirin) .... One Daily 7)  Plavix 75 Mg Tabs (Clopidogrel Bisulfate) .... Take One Tablet By Mouth Daily 8)  Crestor 5 Mg Tabs (Rosuvastatin Calcium) .... Take One Tablet By Mouth Daily. 9)  Coq10 200 Mg Caps (Coenzyme Q10) .... Take 1 Capsule By Mouth Once A Day  Allergies: 1)  ! Pravastatin Sodium (Pravastatin Sodium)  Past History:  Past Medical History: 1. Hyperlipidemia: NMR 2004: LDL 160 (1861/981), HDL 46, TG 131. LDL goal = < 120. Framingham Study LDL goal = <  160.   a. intolerance to statins in the past 2. Osteopenia (last BMD 2011 by Dr Arelia Sneddon ); herpes zoster 1997 L flank 3. Diverticulosis, colon 4. CAD: Exertional dyspnea led to ETT in 2/12.  This showed significant ST depression in a number of leads.  She had LHC (3/12) showing 80% proximal LAD at D2, 80% ostial D2, and subtotal occlusion of a dominant RCA with good L=>R collaterals.  EF 60% with basal inferior hypokinesis.   Family History: Reviewed history from 05/24/2010 and no changes required. Father:Esophageal  cancer , smoker (died @ 41) Mother: CHF, diverticulosis, arthritis, skin cancer ,died of CVA @ 34 Siblings: bro ::excess tobacco & alcohol; P uncle pancreatic cancer P Grandmother:  CVA (died @ 76), pre-DM, DVT MGM:  cancer , ? stomach; M uncle: MI > 67 1st Cousin:  RA Grandson:?" hole in heart" Cousin:  Thalassemia  Social History: Reviewed history from 05/24/2010 and no changes required. Never Smoked; Married Alcohol use-yes: extremely rarely  Regular exercise-yes: walks golf course 2- 5X /week(variable due to season)  Review of Systems       All systems reviewed and negative except as per HPI.   Vital Signs:  Patient profile:   70 year old female Height:      64.75 inches Weight:      141.50 pounds BMI:     23.81 Pulse rate:  72 / minute Pulse rhythm:   regular Resp:     18 per minute BP sitting:   114 / 74  (right arm) Cuff size:   regular  Vitals Entered By: Vikki Ports (June 17, 2010 9:46 AM)  Physical Exam  General:  Well developed, well nourished, in no acute distress. Neck:  Neck supple, no JVD. No masses, thyromegaly or abnormal cervical nodes. Lungs:  Clear bilaterally to auscultation and percussion. Heart:  Non-displaced PMI, chest non-tender; regular rate and rhythm, S1, S2 without murmurs, rubs or gallops. Carotid upstroke normal, no bruit. Pedals normal pulses. No edema, no varicosities. Abdomen:  Bowel sounds positive; abdomen soft and  non-tender without masses, organomegaly, or hernias noted. No hepatosplenomegaly. Extremities:  No clubbing or cyanosis. Neurologic:  Alert and oriented x 3. Psych:  Normal affect.   Impression & Recommendations:  Problem # 1:  CAD, NATIVE VESSEL (ICD-414.01) Patient has symptoms consistent with stable angina at this point: shortness of breath walking up hills.  No rest symptoms currently.  About a month ago, she did have an episode of shortness of breath at rest.  I wonder if this could have been when the RCA occluded.  Based on her anatomy at cath, she would be a CABG candidate.  We could also address her lesions percutaneously, but this would require opening a chronic total occlusion in the RCA as well as stenting the proximal LAD.   I would not want to stent the proximal LAD alone, as any compromise of this stent would affect RCA territory as well as LAD territory (RCA collaterals come off LAD).    We had a long discussion regarding her options.  As she is not a diabetic and has normal LV systolic function, I am not sure that she would have a very significant mortality benefit from CABG.  CABG could certainly decrease her risk of needing future revascularization compared to PCI but would also be more difficult to recover from up-front.  We decided that we would attempt PCI.  I will have Dr. Excell Seltzer attempt to open the chronic total occlusion on the right.  If he is unable to open this vessel, we will proceed with CABG.    Patient will continue metoprolol, ASA, Plavix, and Crestor.   Problem # 2:  HYPERLIPIDEMIA (ICD-272.2) Patient has been intolerant of statins in the past, but given CAD, I think she needs to be re-challenged.  I started her on a low dose of Crestor , 5 mg daily.  She will also take coenzyme Q10.

## 2010-06-22 NOTE — Telephone Encounter (Signed)
Pt states she has a knot size of golf ball.

## 2010-06-22 NOTE — Procedures (Signed)
[name], [name]                 ACCOUNT NO.:  [id number]  MEDICAL RECORD NO.:  [id number]           PATIENT TYPE:  O  LOCATION:  6531                         FACILITY:  [facility]  PHYSICIAN:  Veverly Fells. Excell Seltzer, MD  DATE OF BIRTH:  1941/01/25  DATE OF PROCEDURE: DATE OF DISCHARGE:                           CARDIAC CATHETERIZATION   PROCEDURES: 1. Percutaneous transluminal coronary angioplasty and stenting of the     right coronary artery. 2. Percutaneous transluminal coronary angioplasty and stenting of the     left anterior descending artery. 3. Perclose of the right femoral artery.  PROCEDURAL INDICATIONS:  [name] is a 70 year old woman with exertional dyspnea and class III symptoms.  There is concern about this being her anginal equivalent.  She had abnormal stress test and underwent a cardiac catheterization by Dr. Shirlee Latch.  This demonstrated a chronic total occlusion of the right coronary artery and severe stenosis of mid LAD involving the LAD diagonal bifurcation.  We reviewed her films, discussed PCI versus consideration of coronary artery bypass with the patient and her family in the office and reviewed her revascularization options.  In the setting of preserved left ventricular function and the absence of diabetes, we elected to proceed with PCI.  Risks, indication, and alternatives to percutaneous intervention were reviewed with the patient.  Informed consent was obtained.  The right groin was prepped, draped and anesthetized with 1% lidocaine.  Using the modified Seldinger technique, a 7-French sheath was placed in the right femoral artery.  A 7-French JR-4 guide catheter was used for the right coronary artery intervention.  Bivalirudin was used for anticoagulation. The patient had been preloaded with Plavix.  A 3 g [employer] guidewire was used to cross the lesion.  The lesion was crossed with a moderate amount of difficulty, but this was successful.  The  wire moved freely and I was comfortable that the wire was intraluminal.  We did advance a 1.5 x 20-mm mini Trek over the wire balloon across the lesion and attempted to do a distal balloon injection to confirm intraluminal position, but I was unable to inject due to the small lumen of the balloon.  The Mir[employer] wire was changed out for a Cougar guidewire.  The lesion was dilated with a 1.5 balloon to 14 atmospheres, this restored TIMI 3 flow in the vessel.  The initial flow was TIMI zero.  The lesion was then dilated with a 2.5 x 15-mm Fuller balloon to 8 atmospheres.  The lesion was actually fairly focal and after giving nitroglycerin, the distal vessel was free of any significant disease. The lesion was treated with a 3.0 x 20-mm Promus Element drug-eluting stent and this was deployed at 12 atmospheres and [name] 3.25 x 15 balloon was used to postdilate to 16 atmospheres.  There was an excellent result with 0% residual stenosis and TIMI 3 flow. At that point, I elected to move to the LAD.  A 7-French XB LAD 3.5-cm guide catheter was inserted and initial angiography was performed.  This demonstrated an ulcerated plaque in the mid-LAD with 80% stenosis.  The second  diagonal arising from the middle of this lesion also had a tight 70-80% ostial stenosis.  The LAD was wired with a Cougar guidewire.  The LAD was predilated with 2.5 X 15-mm balloon to 8 atmospheres.  Following balloon predilatation, the diagonal appeared unchanged and I felt that it would likely remained patent.  I did not think it warranted predilatation before stenting the LAD.  The LAD was stented right across the diagonal branch with a 2.75 x 16-mm Promus Element stent.  This was deployed at 14 atmospheres.  The diagonal remained patent.  The stent was well expanded.  The stent was then postdilated with a 3.0 x 12-mm Paisley Quantum Fuller, which was taken to 14 and then 16 atmospheres.  So, the entire stented  segment was postdilated.  There was an excellent angiographic result with 0% residual stenosis.  The diagonal remained patent with ostial stenosis present, but there was TIMI 3 flow.  The patient was chest pain free and she had an excellent result in the main LAD branch.  I thought she had received the maximum benefit from her intervention and the guide catheter was removed.  A Perclose device was used for femoral hemostasis.  The entire procedure was well tolerated.  FINAL ASSESSMENT: 1. Successful stenting of the right coronary artery chronic occlusion     using a single drug-eluting stent. 2. Successful stenting of the mid left anterior descending using a     single drug-eluting stent.  RECOMMENDATIONS:  The patient should remain on dual antiplatelet therapy with aspirin and Plavix for a minimum of 12 months.     Veverly Fells. Excell Seltzer, MD     MDC/MEDQ  D:  06/18/2010  T:  06/19/2010  Job:  [id number]  cc:   Marca Ancona, MD Titus Dubin. Alwyn Ren, MD,FACP,FCCP  Electronically Signed by Tonny Bollman MD on 06/22/2010 06:04:07 PM

## 2010-06-22 NOTE — Progress Notes (Signed)
Summary: Pt returning call  Phone Note Call from Patient Call back at Home Phone [phone]   Caller: Patient Summary of Call: Pt returning  call Initial call taken by: Judie Grieve,  June 16, 2010 9:59 AM

## 2010-06-22 NOTE — Telephone Encounter (Addendum)
I talked with pt--pt had PCI 06/18/10, pt went home 06/19/10--pt states at time of DC she may have had a small knot at site of cath--pt states starting yesterday she noticed area at site of cath had gotten larger--it was the size of a golf ball after walking yesterday but seems better today but not as small as when she was DC from hospital on 06/19/10--pt denies any numbness, tingling, or pain in right foot or leg--pt states bruising is about the same, pt denies any drainage from cath site--I will review with Dr Cooper--reviewed with Dr Cooper--Dr Excell Seltzer recommended LEA today--he will look at cath site after LEA is done--I talked with pt --pt verbalized understanding and  will come to office now for LEA

## 2010-06-30 ENCOUNTER — Ambulatory Visit: Payer: Medicare Other | Admitting: Cardiology

## 2010-07-01 ENCOUNTER — Ambulatory Visit (INDEPENDENT_AMBULATORY_CARE_PROVIDER_SITE_OTHER): Payer: Medicare Other | Admitting: Cardiology

## 2010-07-01 ENCOUNTER — Encounter: Payer: Self-pay | Admitting: Cardiology

## 2010-07-01 VITALS — BP 124/72 | HR 82 | Ht 65.0 in | Wt 141.0 lb

## 2010-07-01 DIAGNOSIS — I2510 Atherosclerotic heart disease of native coronary artery without angina pectoris: Secondary | ICD-10-CM

## 2010-07-01 DIAGNOSIS — E782 Mixed hyperlipidemia: Secondary | ICD-10-CM

## 2010-07-01 MED ORDER — LISINOPRIL 5 MG PO TABS: 5.0000 mg | ORAL_TABLET | Freq: Every day | ORAL | Status: DC

## 2010-07-01 NOTE — Patient Instructions (Signed)
Start Lisinopril 5mg  daily.  Lab in 2 weeks--BMP 414.01  Walk 30 minutes daily.  Schedule an appt with Dr Shirlee Latch in 2 months.

## 2010-07-01 NOTE — Assessment & Plan Note (Signed)
Doing well s/p PCI to LAD and RCA.  No further exertional dyspnea.  Continue ASA 81 and Plavix 75 x at least 1 year (likely longer).  She is on metoprolol and statin.  I am also going to have her start lisinopril at 5 mg daily for secondary prevention.  Will get BMET in 2 weeks.

## 2010-07-01 NOTE — Progress Notes (Signed)
70 yo with history of hyperlipidemia and CAD returns after PCI to RCA and LAD.  Patient had noted new dyspnea when walking [place].  She was set up for an ETT.  This showed below average exercise tolerance and diffuse ST depression in multiple leads.  She was set up for LHC, which showed 80% stenosis of the proximal LAD at D2, 80% ostial D2, and subtotal occlusion of the proximal RCA with left to right collaterals.  After extensive discussion, we decided on PCI to chronically totally occluded RCA as well as LAD rather than CABG.  This was done earlier this month with 1 Promus DES to the RCA and 1 Promus DES to the LAD.  Patient has done well since procedure.  She has been doing some walking, working in the yard, and even played 9 holes of golf.  No exertional dyspnea.  No chest pain.  She has started Crestor and has not had any myalgias (was unable to tolerate pravastatin).  She does not want to do cardiac rehab.   Past Medical History: 1. Hyperlipidemia: NMR 2004: LDL 160 (1861/981), HDL 46, TG 131. LDL goal = < 120. Framingham Study LDL goal = < 160.   a. intolerance to pravastatin (myalgias) 2. Osteopenia (last BMD 2011 by Dr Arelia Sneddon ) 3. Herpes zoster 1997 L flank 4. Diverticulosis, colon 5. CAD: Exertional dyspnea led to ETT in 2/12.  This showed significant ST depression in a number of leads.  She had LHC (3/12) showing 80% proximal LAD at D2, 80% ostial D2, and subtotal occlusion of a dominant RCA with good L=>R collaterals.  EF 60% with basal inferior hypokinesis.  Patient had PCI to RCA with 3 x 20 Promus DES and PCI to LAD with 2.75 x 16 Promus DES with good results.   Family History: Father:Esophageal  cancer , smoker (died @ 46) Mother: CHF, diverticulosis, arthritis, skin cancer ,died of CVA @ 73 Siblings: bro: excess tobacco & alcohol; P uncle pancreatic cancer P Grandmother:  CVA (died @ 80), pre-DM, DVT MGM:  cancer , ? stomach; M uncle: MI > 50 1st Cousin:   RA Grandson:?" hole in heart" Cousin:  Thalassemia  Social History: Never Smoked; Married Alcohol use-yes: extremely rarely   Review of Systems        All systems reviewed and negative except as per HPI.   Current Outpatient Prescriptions  Medication Sig Dispense Refill  . aspirin 81 MG tablet Take 81 mg by mouth daily.        . Black Currant Seed Oil 500 MG CAPS Take by mouth.        . Calcium Citrate-Vitamin D (CITRUS CALCIUM 1500 + D PO) Take by mouth.        . Coenzyme Q10 (COQ-10) 100 MG capsule Take 100 mg by mouth daily.        . CRESTOR 5 MG tablet Take 1 tablet by mouth Daily.      . metoprolol tartrate (LOPRESSOR) 25 MG tablet Take 25 mg by mouth 2 (two) times daily.        . Multiple Vitamin (MULTIVITAMIN) capsule Take 1 capsule by mouth daily.        . nitroGLYCERIN (NITROSTAT) 0.4 MG SL tablet Place 0.4 mg under the tongue every 5 (five) minutes as needed.        Marland Kitchen DISCONTD: PLAVIX 75 MG tablet Take 1 tablet by mouth Daily.      Marland Kitchen lisinopril (PRINIVIL,ZESTRIL) 5 MG  tablet Take 1 tablet (5 mg total) by mouth daily.  30 tablet  11    BP 124/72  Pulse 82  Ht 5\' 5"  (1.651 m)  Wt 141 lb (63.957 kg)  BMI 23.46 kg/m2 General: NAD Neck: No JVD, no thyromegaly or thyroid nodule.  Lungs: Clear to auscultation bilaterally with normal respiratory effort. CV: Nondisplaced PMI.  Heart regular S1/S2, no S3/S4, no murmur.  No peripheral edema.  No carotid bruit.  Normal pedal pulses.  Abdomen: Soft, nontender, no hepatosplenomegaly, no distention.  Neurologic: Alert and oriented x 3.  Psych: Normal affect. Extremities: No clubbing or cyanosis.

## 2010-07-01 NOTE — Assessment & Plan Note (Signed)
Tolerating Crestor so far.  Will get lipids/LFTs in 2 months with goal LDL < 70.  If above goal, slowly increase and watch for myalgias.

## 2010-07-08 ENCOUNTER — Telehealth: Payer: Self-pay | Admitting: Cardiology

## 2010-07-08 ENCOUNTER — Encounter: Payer: Self-pay | Admitting: *Deleted

## 2010-07-08 NOTE — Telephone Encounter (Signed)
Pt. started Lisinopril 5 mg a week ago. She is itching for the last two to three days. RN left a message a home phone to call back.

## 2010-07-08 NOTE — Telephone Encounter (Signed)
Pt returning your call

## 2010-07-08 NOTE — Telephone Encounter (Signed)
Lisinopril started last week, itching last two to three days pt [phone]

## 2010-07-08 NOTE — Telephone Encounter (Signed)
I talked with pt by telephone. Pt states she started lisinopril last Friday. Starting Sunday she began itching all over. She states she does not have a defined rash, she denies swelling in her face, tongue , or lips. I reviewed with Dr Shirlee Latch. Pt will hold Lisinopril and use Benadryl 25mg  prn. Pt will let Dr Shirlee Latch know if symptoms do not improve. Pt will seek medical care if symptoms do not improve off Lisinopril

## 2010-07-12 ENCOUNTER — Other Ambulatory Visit (INDEPENDENT_AMBULATORY_CARE_PROVIDER_SITE_OTHER): Payer: Medicare Other | Admitting: *Deleted

## 2010-07-12 DIAGNOSIS — I2510 Atherosclerotic heart disease of native coronary artery without angina pectoris: Secondary | ICD-10-CM

## 2010-07-12 LAB — BASIC METABOLIC PANEL
CO2: 25 mEq/L (ref 19–32)
Creatinine, Ser: 0.8 mg/dL (ref 0.4–1.2)
Potassium: 3.7 mEq/L (ref 3.5–5.1)

## 2010-08-17 ENCOUNTER — Ambulatory Visit (INDEPENDENT_AMBULATORY_CARE_PROVIDER_SITE_OTHER): Payer: Medicare Other | Admitting: Cardiology

## 2010-08-17 ENCOUNTER — Encounter: Payer: Self-pay | Admitting: Cardiology

## 2010-08-17 VITALS — BP 124/56 | HR 73 | Ht 65.0 in | Wt 140.0 lb

## 2010-08-17 DIAGNOSIS — I2510 Atherosclerotic heart disease of native coronary artery without angina pectoris: Secondary | ICD-10-CM

## 2010-08-17 DIAGNOSIS — E782 Mixed hyperlipidemia: Secondary | ICD-10-CM

## 2010-08-17 NOTE — Progress Notes (Signed)
PCP: Dr. Alwyn Ren  70 yo with history of hyperlipidemia and CAD returns after PCI to RCA and LAD. Patient had noted new dyspnea when [place]. She was set up for an ETT. This showed below average exercise tolerance and diffuse ST depression in multiple leads. She was set up for LHC, which showed 80% stenosis of the proximal LAD at D2, 80% ostial D2, and subtotal occlusion of the proximal RCA with left to right collaterals. After extensive discussion, we decided on PCI to chronically totally occluded RCA as well as LAD rather than CABG. This was done in 3/12 with 1 Promus DES to the RCA and 1 Promus DES to the LAD. Patient has done well since procedure. About a month ago, she noted dyspnea when walking on the golf course similar to prior to PCI. However, she has played golf multiple times since then and has had no dyspnea.  She feels like she is doing well now.  She has never had chest pain.  She has started Crestor and has not had any myalgias (was unable to tolerate pravastatin).  Lisinopril made her itch intensely so she has stopped it.   ECG: NSR, normal  Past Medical History:  1. Hyperlipidemia: NMR 2004: LDL 160 (1861/981), HDL 46, TG 131. LDL goal = < 70.  a. intolerance to pravastatin (myalgias)  2. Osteopenia (last BMD 2011 by Dr Arelia Sneddon )  3. Herpes zoster 1997 L flank  4. Diverticulosis, colon  5. CAD: Exertional dyspnea led to ETT in 2/12. This showed significant ST depression in a number of leads. She had LHC (3/12) showing 80% proximal LAD at D2, 80% ostial D2, and subtotal occlusion of a dominant RCA with good L=>R collaterals. EF 60% with basal inferior hypokinesis. Patient had PCI to RCA with 3 x 20 Promus DES and PCI to LAD with 2.75 x 16 Promus DES with good results.   Family History:  Father:Esophageal cancer , smoker (died @ 56)  Mother: CHF, diverticulosis, arthritis, skin cancer ,died of CVA @ 64  Siblings: bro: excess tobacco & alcohol; P uncle  pancreatic cancer  P Grandmother: CVA (died @ 70), pre-DM, DVT  MGM: cancer , ? stomach; M uncle: MI > 70  1st Cousin: RA  Grandson:?" hole in heart"  Cousin: Thalassemia   Social History:  Never Smoked;  Married  Alcohol use-yes: extremely rarely   Current Outpatient Prescriptions  Medication Sig Dispense Refill  . aspirin 81 MG tablet Take 81 mg by mouth daily.        . Black Currant Seed Oil 500 MG CAPS Take by mouth daily.       . Calcium Citrate-Vitamin D (CITRUS CALCIUM 1500 + D PO) Take by mouth daily.       . clopidogrel (PLAVIX) 75 MG tablet Take 75 mg by mouth daily.        . Coenzyme Q10 (COQ-10) 100 MG capsule Take 100 mg by mouth daily.        . CRESTOR 5 MG tablet Take 1 tablet by mouth Daily.      . metoprolol tartrate (LOPRESSOR) 25 MG tablet Take 25 mg by mouth 2 (two) times daily.        . Multiple Vitamin (MULTIVITAMIN) capsule Take 1 capsule by mouth daily.        . nitroGLYCERIN (NITROSTAT) 0.4 MG SL tablet Place 0.4 mg under the tongue every 5 (five) minutes as needed.        Marland Kitchen  DISCONTD: lisinopril (PRINIVIL,ZESTRIL) 5 MG tablet Take 1 tablet (5 mg total) by mouth daily.  30 tablet  11    BP 124/56  Pulse 73  Ht 5\' 5"  (1.651 m)  Wt 140 lb (63.504 kg)  BMI 23.30 kg/m2 General: NAD Neck: No JVD, no thyromegaly or thyroid nodule.  Lungs: Clear to auscultation bilaterally with normal respiratory effort. CV: Nondisplaced PMI.  Heart regular S1/S2, no S3/S4, no murmur.  No peripheral edema.  No carotid bruit.  Normal pedal pulses.  Abdomen: Soft, nontender, no hepatosplenomegaly, no distention.  Neurologic: Alert and oriented x 3.  Psych: Normal affect. Extremities: No clubbing or cyanosis.

## 2010-08-17 NOTE — Patient Instructions (Signed)
Schedule an appointment for fasting lab in the next week or so--lipid profile/liver profile 414.01.  Schedule an appointment to see Dr. Shirlee Latch in 4 months.

## 2010-08-17 NOTE — Assessment & Plan Note (Signed)
Doing well s/p PCI to LAD and RCA.  No further exertional dyspnea for several weeks.  Continue ASA 81 and Plavix 75 x at least 1 year (likely longer).  She is on metoprolol and statin. She was unable to tolerate lisinopril due to itching.

## 2010-08-17 NOTE — Assessment & Plan Note (Signed)
I will have patient return for lipids/LFTs.  Goal LDL < 70.  She is tolerating Crestor with coenzyme Q 10.

## 2010-08-26 ENCOUNTER — Other Ambulatory Visit: Payer: Medicare Other

## 2010-09-06 ENCOUNTER — Encounter (HOSPITAL_COMMUNITY): Payer: Medicare Other | Attending: Cardiology

## 2010-09-06 ENCOUNTER — Ambulatory Visit (HOSPITAL_COMMUNITY): Payer: Medicare Other

## 2010-09-06 DIAGNOSIS — E785 Hyperlipidemia, unspecified: Secondary | ICD-10-CM | POA: Insufficient documentation

## 2010-09-06 DIAGNOSIS — M899 Disorder of bone, unspecified: Secondary | ICD-10-CM | POA: Insufficient documentation

## 2010-09-06 DIAGNOSIS — I2510 Atherosclerotic heart disease of native coronary artery without angina pectoris: Secondary | ICD-10-CM | POA: Insufficient documentation

## 2010-09-06 DIAGNOSIS — Z5189 Encounter for other specified aftercare: Secondary | ICD-10-CM | POA: Insufficient documentation

## 2010-09-06 DIAGNOSIS — R9439 Abnormal result of other cardiovascular function study: Secondary | ICD-10-CM | POA: Insufficient documentation

## 2010-09-06 DIAGNOSIS — R0989 Other specified symptoms and signs involving the circulatory and respiratory systems: Secondary | ICD-10-CM | POA: Insufficient documentation

## 2010-09-06 DIAGNOSIS — Z9861 Coronary angioplasty status: Secondary | ICD-10-CM | POA: Insufficient documentation

## 2010-09-06 DIAGNOSIS — R0609 Other forms of dyspnea: Secondary | ICD-10-CM | POA: Insufficient documentation

## 2010-09-06 DIAGNOSIS — I2582 Chronic total occlusion of coronary artery: Secondary | ICD-10-CM | POA: Insufficient documentation

## 2010-09-08 ENCOUNTER — Ambulatory Visit (HOSPITAL_COMMUNITY): Payer: Medicare Other

## 2010-09-08 ENCOUNTER — Encounter (HOSPITAL_COMMUNITY): Payer: Medicare Other

## 2010-09-10 ENCOUNTER — Encounter (HOSPITAL_COMMUNITY): Payer: Medicare Other

## 2010-09-10 ENCOUNTER — Ambulatory Visit (HOSPITAL_COMMUNITY): Payer: Medicare Other

## 2010-09-13 ENCOUNTER — Encounter (HOSPITAL_COMMUNITY): Payer: Medicare Other

## 2010-09-13 ENCOUNTER — Ambulatory Visit (HOSPITAL_COMMUNITY): Payer: Medicare Other

## 2010-09-15 ENCOUNTER — Ambulatory Visit (HOSPITAL_COMMUNITY): Payer: Medicare Other

## 2010-09-15 ENCOUNTER — Encounter (HOSPITAL_COMMUNITY): Payer: Medicare Other

## 2010-09-17 ENCOUNTER — Encounter (HOSPITAL_COMMUNITY): Payer: Medicare Other

## 2010-09-17 ENCOUNTER — Ambulatory Visit (HOSPITAL_COMMUNITY): Payer: Medicare Other

## 2010-09-20 ENCOUNTER — Telehealth: Payer: Self-pay | Admitting: Cardiology

## 2010-09-20 ENCOUNTER — Encounter (HOSPITAL_COMMUNITY): Payer: Medicare Other

## 2010-09-20 ENCOUNTER — Ambulatory Visit (HOSPITAL_COMMUNITY): Payer: Medicare Other

## 2010-09-20 NOTE — Telephone Encounter (Signed)
Pt has question  about increase brusing around leg and arms. Pt cell# [phone] pt would like to speak with a nurse.

## 2010-09-20 NOTE — Telephone Encounter (Signed)
Plavix will make her more likely to bruise.

## 2010-09-20 NOTE — Telephone Encounter (Signed)
I talked with pt. Pt states for within  the last 2 weeks she has noticed some bruises about 1/2 the size of a lemon-- 2 on each arm and 2  on each leg. Pt states she has bruising before about the size of a pea. The bruises seem to be getting better. Pt denies any other signs of bleeding. I will review with Dr Shirlee Latch.

## 2010-09-20 NOTE — Telephone Encounter (Signed)
LMTCB

## 2010-09-21 ENCOUNTER — Telehealth: Payer: Self-pay | Admitting: Cardiology

## 2010-09-21 NOTE — Telephone Encounter (Signed)
Per pt calling back to speak with anne l

## 2010-09-21 NOTE — Telephone Encounter (Signed)
I discussed with pt 

## 2010-09-21 NOTE — Telephone Encounter (Signed)
I talked with pt. She will come for lab 09/23/10.

## 2010-09-22 ENCOUNTER — Ambulatory Visit (HOSPITAL_COMMUNITY): Payer: Medicare Other

## 2010-09-22 ENCOUNTER — Encounter (HOSPITAL_COMMUNITY): Payer: Medicare Other

## 2010-09-23 ENCOUNTER — Other Ambulatory Visit (INDEPENDENT_AMBULATORY_CARE_PROVIDER_SITE_OTHER): Payer: Medicare Other | Admitting: *Deleted

## 2010-09-23 DIAGNOSIS — I2510 Atherosclerotic heart disease of native coronary artery without angina pectoris: Secondary | ICD-10-CM

## 2010-09-23 LAB — LIPID PANEL
HDL: 50.1 mg/dL (ref >39.00)
LDL Cholesterol: 95 mg/dL (ref 0–99)
Total CHOL/HDL Ratio: 3
Triglycerides: 51 mg/dL (ref 0.0–149.0)
VLDL: 10.2 mg/dL (ref 0.0–40.0)

## 2010-09-23 LAB — HEPATIC FUNCTION PANEL: Albumin: 4.2 g/dL (ref 3.5–5.2)

## 2010-09-24 ENCOUNTER — Ambulatory Visit (HOSPITAL_COMMUNITY): Payer: Medicare Other

## 2010-09-24 ENCOUNTER — Encounter (HOSPITAL_COMMUNITY): Payer: Medicare Other

## 2010-09-27 ENCOUNTER — Encounter (HOSPITAL_COMMUNITY): Payer: Medicare Other

## 2010-09-27 ENCOUNTER — Ambulatory Visit (HOSPITAL_COMMUNITY): Payer: Medicare Other

## 2010-09-29 ENCOUNTER — Encounter (HOSPITAL_COMMUNITY): Payer: Medicare Other

## 2010-09-29 ENCOUNTER — Telehealth: Payer: Self-pay | Admitting: *Deleted

## 2010-09-29 ENCOUNTER — Ambulatory Visit (HOSPITAL_COMMUNITY): Payer: Medicare Other

## 2010-09-29 DIAGNOSIS — E782 Mixed hyperlipidemia: Secondary | ICD-10-CM

## 2010-09-29 DIAGNOSIS — I2510 Atherosclerotic heart disease of native coronary artery without angina pectoris: Secondary | ICD-10-CM

## 2010-09-29 MED ORDER — ROSUVASTATIN CALCIUM 10 MG PO TABS: 10.0000 mg | ORAL_TABLET | Freq: Every day | ORAL | Status: DC

## 2010-09-29 NOTE — Telephone Encounter (Signed)
Message copied by Jacqlyn Krauss on Wed Sep 29, 2010  9:14 AM ------      Message from: Laurey Morale      Created: Mon Sep 27, 2010 12:32 AM       LDL too high.  Increase Crestor to 10 mg daily.  Lipids/LFTs in 2 months.

## 2010-09-29 NOTE — Telephone Encounter (Signed)
Notes Recorded by Jacqlyn Krauss, RN on 09/29/2010 at 9:14 AM I discussed results with pt. She will increase Crestor to 10mg  alternating with 5mg  for 1-2 weeks. If she tolerates this she will increase Crestor to 10mg  daily. She will return for fasting lipid/liver profile 12/01/10. Notes Recorded by Marca Ancona, MD on 09/27/2010 at 12:32 AM LDL too high. Increase Crestor to 10 mg daily. Lipids/LFTs in 2 months.

## 2010-10-01 ENCOUNTER — Ambulatory Visit (HOSPITAL_COMMUNITY): Payer: Medicare Other

## 2010-10-01 ENCOUNTER — Encounter (HOSPITAL_COMMUNITY): Payer: Medicare Other

## 2010-10-04 ENCOUNTER — Ambulatory Visit (HOSPITAL_COMMUNITY): Payer: Medicare Other

## 2010-10-04 ENCOUNTER — Encounter (HOSPITAL_COMMUNITY): Payer: Medicare Other

## 2010-10-06 ENCOUNTER — Encounter (HOSPITAL_COMMUNITY): Payer: Medicare Other

## 2010-10-06 ENCOUNTER — Ambulatory Visit (HOSPITAL_COMMUNITY): Payer: Medicare Other

## 2010-10-08 ENCOUNTER — Encounter (HOSPITAL_COMMUNITY): Payer: Medicare Other

## 2010-10-08 ENCOUNTER — Ambulatory Visit (HOSPITAL_COMMUNITY): Payer: Medicare Other

## 2010-10-11 ENCOUNTER — Encounter (HOSPITAL_COMMUNITY): Payer: Medicare Other

## 2010-10-11 ENCOUNTER — Ambulatory Visit (HOSPITAL_COMMUNITY): Payer: Medicare Other

## 2010-10-13 ENCOUNTER — Encounter (HOSPITAL_COMMUNITY): Payer: Medicare Other

## 2010-10-13 ENCOUNTER — Ambulatory Visit (HOSPITAL_COMMUNITY): Payer: Medicare Other

## 2010-10-15 ENCOUNTER — Ambulatory Visit (HOSPITAL_COMMUNITY): Payer: Medicare Other

## 2010-10-15 ENCOUNTER — Encounter (HOSPITAL_COMMUNITY): Payer: Medicare Other

## 2010-10-18 ENCOUNTER — Ambulatory Visit (HOSPITAL_COMMUNITY): Payer: Medicare Other

## 2010-10-18 ENCOUNTER — Encounter (HOSPITAL_COMMUNITY): Payer: Medicare Other

## 2010-10-20 ENCOUNTER — Ambulatory Visit (HOSPITAL_COMMUNITY): Payer: Medicare Other

## 2010-10-20 ENCOUNTER — Encounter (HOSPITAL_COMMUNITY): Payer: Medicare Other

## 2010-10-22 ENCOUNTER — Ambulatory Visit (HOSPITAL_COMMUNITY): Payer: Medicare Other

## 2010-10-22 ENCOUNTER — Encounter (HOSPITAL_COMMUNITY): Payer: Medicare Other

## 2010-10-25 ENCOUNTER — Ambulatory Visit (HOSPITAL_COMMUNITY): Payer: Medicare Other

## 2010-10-25 ENCOUNTER — Encounter (HOSPITAL_COMMUNITY): Payer: Medicare Other

## 2010-10-27 ENCOUNTER — Ambulatory Visit (HOSPITAL_COMMUNITY): Payer: Medicare Other

## 2010-10-27 ENCOUNTER — Encounter (HOSPITAL_COMMUNITY): Payer: Medicare Other

## 2010-10-29 ENCOUNTER — Ambulatory Visit (HOSPITAL_COMMUNITY): Payer: Medicare Other

## 2010-10-29 ENCOUNTER — Encounter (HOSPITAL_COMMUNITY): Payer: Medicare Other

## 2010-11-01 ENCOUNTER — Ambulatory Visit (HOSPITAL_COMMUNITY): Payer: Medicare Other

## 2010-11-01 ENCOUNTER — Encounter (HOSPITAL_COMMUNITY): Payer: Medicare Other

## 2010-11-03 ENCOUNTER — Ambulatory Visit (HOSPITAL_COMMUNITY): Payer: Medicare Other

## 2010-11-03 ENCOUNTER — Encounter (HOSPITAL_COMMUNITY): Payer: Medicare Other

## 2010-11-05 ENCOUNTER — Ambulatory Visit (HOSPITAL_COMMUNITY): Payer: Medicare Other

## 2010-11-05 ENCOUNTER — Encounter (HOSPITAL_COMMUNITY): Payer: Medicare Other

## 2010-11-08 ENCOUNTER — Encounter (HOSPITAL_COMMUNITY): Payer: Medicare Other

## 2010-11-08 ENCOUNTER — Ambulatory Visit (HOSPITAL_COMMUNITY): Payer: Medicare Other

## 2010-11-10 ENCOUNTER — Encounter (HOSPITAL_COMMUNITY): Payer: Medicare Other

## 2010-11-10 ENCOUNTER — Ambulatory Visit (HOSPITAL_COMMUNITY): Payer: Medicare Other

## 2010-11-12 ENCOUNTER — Encounter (HOSPITAL_COMMUNITY): Payer: Medicare Other

## 2010-11-12 ENCOUNTER — Ambulatory Visit (HOSPITAL_COMMUNITY): Payer: Medicare Other

## 2010-11-15 ENCOUNTER — Encounter (HOSPITAL_COMMUNITY): Payer: Medicare Other

## 2010-11-15 ENCOUNTER — Ambulatory Visit (HOSPITAL_COMMUNITY): Payer: Medicare Other

## 2010-11-17 ENCOUNTER — Ambulatory Visit (HOSPITAL_COMMUNITY): Payer: Medicare Other

## 2010-11-17 ENCOUNTER — Encounter (HOSPITAL_COMMUNITY): Payer: Medicare Other

## 2010-11-19 ENCOUNTER — Encounter (HOSPITAL_COMMUNITY): Payer: Medicare Other

## 2010-11-19 ENCOUNTER — Ambulatory Visit (HOSPITAL_COMMUNITY): Payer: Medicare Other

## 2010-11-22 ENCOUNTER — Encounter (HOSPITAL_COMMUNITY): Payer: Medicare Other

## 2010-11-22 ENCOUNTER — Ambulatory Visit (HOSPITAL_COMMUNITY): Payer: Medicare Other

## 2010-11-24 ENCOUNTER — Encounter (HOSPITAL_COMMUNITY): Payer: Medicare Other

## 2010-11-24 ENCOUNTER — Ambulatory Visit (HOSPITAL_COMMUNITY): Payer: Medicare Other

## 2010-11-26 ENCOUNTER — Encounter (HOSPITAL_COMMUNITY): Payer: Medicare Other

## 2010-11-26 ENCOUNTER — Ambulatory Visit (HOSPITAL_COMMUNITY): Payer: Medicare Other

## 2010-11-29 ENCOUNTER — Ambulatory Visit (HOSPITAL_COMMUNITY): Payer: Medicare Other

## 2010-11-29 ENCOUNTER — Encounter (HOSPITAL_COMMUNITY): Payer: Medicare Other

## 2010-12-01 ENCOUNTER — Other Ambulatory Visit (INDEPENDENT_AMBULATORY_CARE_PROVIDER_SITE_OTHER): Payer: Medicare Other | Admitting: *Deleted

## 2010-12-01 ENCOUNTER — Encounter (HOSPITAL_COMMUNITY): Payer: Medicare Other

## 2010-12-01 ENCOUNTER — Ambulatory Visit (HOSPITAL_COMMUNITY): Payer: Medicare Other

## 2010-12-01 DIAGNOSIS — E782 Mixed hyperlipidemia: Secondary | ICD-10-CM

## 2010-12-01 DIAGNOSIS — I2510 Atherosclerotic heart disease of native coronary artery without angina pectoris: Secondary | ICD-10-CM

## 2010-12-01 LAB — LIPID PANEL
LDL Cholesterol: 65 mg/dL (ref 0–99)
VLDL: 12.8 mg/dL (ref 0.0–40.0)

## 2010-12-01 LAB — HEPATIC FUNCTION PANEL
AST: 23 U/L (ref 0–37)
Alkaline Phosphatase: 51 U/L (ref 39–117)
Bilirubin, Direct: 0.1 mg/dL (ref 0.0–0.3)
Total Bilirubin: 0.6 mg/dL (ref 0.3–1.2)

## 2010-12-03 ENCOUNTER — Ambulatory Visit (HOSPITAL_COMMUNITY): Payer: Medicare Other

## 2010-12-03 ENCOUNTER — Encounter (HOSPITAL_COMMUNITY): Payer: Medicare Other

## 2010-12-06 ENCOUNTER — Encounter (HOSPITAL_COMMUNITY): Payer: Medicare Other

## 2010-12-06 ENCOUNTER — Ambulatory Visit (HOSPITAL_COMMUNITY): Payer: Medicare Other

## 2010-12-08 ENCOUNTER — Ambulatory Visit (HOSPITAL_COMMUNITY): Payer: Medicare Other

## 2010-12-08 ENCOUNTER — Encounter (HOSPITAL_COMMUNITY): Payer: Medicare Other

## 2010-12-10 ENCOUNTER — Ambulatory Visit (HOSPITAL_COMMUNITY): Payer: Medicare Other

## 2010-12-10 ENCOUNTER — Encounter (HOSPITAL_COMMUNITY): Payer: Medicare Other

## 2010-12-13 ENCOUNTER — Encounter (HOSPITAL_COMMUNITY): Payer: Medicare Other

## 2010-12-13 ENCOUNTER — Ambulatory Visit (HOSPITAL_COMMUNITY): Payer: Medicare Other

## 2010-12-15 ENCOUNTER — Encounter (HOSPITAL_COMMUNITY): Payer: Medicare Other

## 2010-12-15 ENCOUNTER — Ambulatory Visit (HOSPITAL_COMMUNITY): Payer: Medicare Other

## 2010-12-17 ENCOUNTER — Encounter (HOSPITAL_COMMUNITY): Payer: Medicare Other

## 2010-12-17 ENCOUNTER — Ambulatory Visit (HOSPITAL_COMMUNITY): Payer: Medicare Other

## 2010-12-20 ENCOUNTER — Ambulatory Visit (INDEPENDENT_AMBULATORY_CARE_PROVIDER_SITE_OTHER): Payer: Medicare Other | Admitting: Cardiology

## 2010-12-20 ENCOUNTER — Ambulatory Visit (HOSPITAL_COMMUNITY): Payer: Medicare Other

## 2010-12-20 ENCOUNTER — Encounter (HOSPITAL_COMMUNITY): Payer: Medicare Other

## 2010-12-20 ENCOUNTER — Encounter: Payer: Self-pay | Admitting: Cardiology

## 2010-12-20 VITALS — BP 96/62 | HR 75 | Ht 65.0 in | Wt 140.4 lb

## 2010-12-20 DIAGNOSIS — E782 Mixed hyperlipidemia: Secondary | ICD-10-CM

## 2010-12-20 DIAGNOSIS — I2510 Atherosclerotic heart disease of native coronary artery without angina pectoris: Secondary | ICD-10-CM

## 2010-12-20 DIAGNOSIS — E785 Hyperlipidemia, unspecified: Secondary | ICD-10-CM

## 2010-12-20 NOTE — Assessment & Plan Note (Signed)
Doing well s/p PCI to LAD and RCA.  Continue ASA 81 and Plavix 75 x at least 1 year (likely longer).  She is on metoprolol and statin. She was unable to tolerate lisinopril due to itching.  She has chronic dyspnea walking [place] on the golf [place].  As long as this does not worsen, no further evaluation needed.

## 2010-12-20 NOTE — Progress Notes (Signed)
PCP: Dr. Alwyn Ren  70 yo with history of hyperlipidemia and CAD returns after PCI to RCA and LAD. Patient had noted new dyspnea when [place]. She was set up for an ETT. This showed below average exercise tolerance and diffuse ST depression in multiple leads. She was set up for LHC, which showed 80% stenosis of the proximal LAD at D2, 80% ostial D2, and subtotal occlusion of the proximal RCA with left to right collaterals. After extensive discussion, we decided on PCI to chronically totally occluded RCA as well as LAD rather than CABG. This was done in 3/12 with 1 Promus DES to the RCA and 1 Promus DES to the LAD.   Patient still gets short of breath when she walks [place] hills on the golf [place].  This has been chronic since her PCI.  It does seem to be improving.  She does still walk 9 holes of golf 3-4 times a week.  She did cardiac rehab for 1 month since I last saw her.  She did well with no particular issues.  She has never had chest pain.  She has tolerated Crestor 5 mg alternating with 10 mg without myalgias.  She had myalgias with 10 mg daily.  She had intense pruritis with lisinopril that resolved after stopping the medication.   Labs (8/12): LDL 65, HDL 54  Past Medical History:  1. Hyperlipidemia - intolerance to pravastatin (myalgias)  2. Osteopenia (last BMD 2011 by Dr Arelia Sneddon )  3. Herpes zoster 1997 L flank  4. Diverticulosis, colon  5. CAD: Exertional dyspnea led to ETT in 2/12. This showed significant ST depression in a number of leads. She had LHC (3/12) showing 80% proximal LAD at D2, 80% ostial D2, and subtotal occlusion of a dominant RCA with good L=>R collaterals. EF 60% with basal inferior hypokinesis. Patient had PCI to RCA with 3 x 20 Promus DES and PCI to LAD with 2.75 x 16 Promus DES with good results.   Family History:  Father:Esophageal cancer , smoker (died @ 56)  Mother: CHF, diverticulosis, arthritis, skin cancer ,died of CVA @ 75  Siblings:  bro: excess tobacco & alcohol; P uncle pancreatic cancer  P Grandmother: CVA (died @ 55), pre-DM, DVT  MGM: cancer , ? stomach; M uncle: MI > 28  1st Cousin: RA  Grandson:?" hole in heart"  Cousin: Thalassemia   Social History:  Never Smoked;  Married  Alcohol use-yes: extremely rarely   Current Outpatient Prescriptions  Medication Sig Dispense Refill  . aspirin 81 MG tablet Take 81 mg by mouth daily.        . Black Currant Seed Oil 500 MG CAPS Take by mouth daily.       . Calcium Citrate-Vitamin D (CITRUS CALCIUM 1500 + D PO) Take by mouth daily.       . clopidogrel (PLAVIX) 75 MG tablet Take 75 mg by mouth daily.        . Coenzyme Q10 (COQ-10) 100 MG capsule Take 200 mg by mouth daily.       . metoprolol tartrate (LOPRESSOR) 25 MG tablet Take 25 mg by mouth 2 (two) times daily.        . Multiple Vitamin (MULTIVITAMIN) capsule Take 1 capsule by mouth daily.        . nitroGLYCERIN (NITROSTAT) 0.4 MG SL tablet Place 0.4 mg under the tongue every 5 (five) minutes as needed.        . rosuvastatin (CRESTOR)  10 MG tablet Take 10 mg by mouth daily. Pt alternating 10 mg every other day with 5 mg on every other day         Ht 5\' 5"  (1.651 m)  Wt 140 lb 6.4 oz (63.685 kg)  BMI 23.36 kg/m2 General: NAD Neck: No JVD, no thyromegaly or thyroid nodule.  Lungs: Clear to auscultation bilaterally with normal respiratory effort. CV: Nondisplaced PMI.  Heart regular S1/S2, no S3/S4, no murmur.  No peripheral edema.  No carotid bruit.  Normal pedal pulses.  Abdomen: Soft, nontender, no hepatosplenomegaly, no distention.  Neurologic: Alert and oriented x 3.  Psych: Normal affect. Extremities: No clubbing or cyanosis.

## 2010-12-20 NOTE — Patient Instructions (Signed)
Your physician recommends that you return for lab work in: 6 months fasting cholesterol and liver enzymes. Same day as appt with Dr. Shirlee Latch  Your physician recommends that you schedule a follow-up appointment in: 6 months with Dr. Shirlee Latch

## 2010-12-20 NOTE — Assessment & Plan Note (Signed)
LDL is at goal on current dose of statin.  Myalgias with 10 mg daily of Crestor but she can tolerate 5 mg alternating with 10 mg.

## 2010-12-22 ENCOUNTER — Ambulatory Visit (HOSPITAL_COMMUNITY): Payer: Medicare Other

## 2010-12-22 ENCOUNTER — Encounter (HOSPITAL_COMMUNITY): Payer: Medicare Other

## 2010-12-24 ENCOUNTER — Ambulatory Visit (HOSPITAL_COMMUNITY): Payer: Medicare Other

## 2010-12-24 ENCOUNTER — Encounter (HOSPITAL_COMMUNITY): Payer: Medicare Other

## 2010-12-27 ENCOUNTER — Encounter (HOSPITAL_COMMUNITY): Payer: Medicare Other

## 2010-12-27 ENCOUNTER — Ambulatory Visit (HOSPITAL_COMMUNITY): Payer: Medicare Other

## 2010-12-29 ENCOUNTER — Encounter (HOSPITAL_COMMUNITY): Payer: Medicare Other

## 2010-12-29 ENCOUNTER — Ambulatory Visit (HOSPITAL_COMMUNITY): Payer: Medicare Other

## 2010-12-31 ENCOUNTER — Encounter (HOSPITAL_COMMUNITY): Payer: Medicare Other

## 2010-12-31 ENCOUNTER — Ambulatory Visit (HOSPITAL_COMMUNITY): Payer: Medicare Other

## 2011-01-03 ENCOUNTER — Ambulatory Visit (HOSPITAL_COMMUNITY): Payer: Medicare Other

## 2011-01-03 ENCOUNTER — Other Ambulatory Visit: Payer: Self-pay | Admitting: Cardiology

## 2011-01-03 ENCOUNTER — Encounter (HOSPITAL_COMMUNITY): Payer: Medicare Other

## 2011-01-05 ENCOUNTER — Encounter (HOSPITAL_COMMUNITY): Payer: Medicare Other

## 2011-01-05 ENCOUNTER — Ambulatory Visit (HOSPITAL_COMMUNITY): Payer: Medicare Other

## 2011-01-07 ENCOUNTER — Ambulatory Visit (HOSPITAL_COMMUNITY): Payer: Medicare Other

## 2011-01-07 ENCOUNTER — Encounter (HOSPITAL_COMMUNITY): Payer: Medicare Other

## 2011-01-10 ENCOUNTER — Ambulatory Visit (HOSPITAL_COMMUNITY): Payer: Medicare Other

## 2011-01-10 ENCOUNTER — Encounter (HOSPITAL_COMMUNITY): Payer: Medicare Other

## 2011-01-12 ENCOUNTER — Ambulatory Visit (HOSPITAL_COMMUNITY): Payer: Medicare Other

## 2011-01-12 ENCOUNTER — Encounter (HOSPITAL_COMMUNITY): Payer: Medicare Other

## 2011-01-14 ENCOUNTER — Ambulatory Visit (HOSPITAL_COMMUNITY): Payer: Medicare Other

## 2011-01-14 ENCOUNTER — Encounter (HOSPITAL_COMMUNITY): Payer: Medicare Other

## 2011-01-17 ENCOUNTER — Ambulatory Visit (HOSPITAL_COMMUNITY): Payer: Medicare Other

## 2011-01-17 ENCOUNTER — Encounter (HOSPITAL_COMMUNITY): Payer: Medicare Other

## 2011-01-19 ENCOUNTER — Ambulatory Visit (HOSPITAL_COMMUNITY): Payer: Medicare Other

## 2011-01-19 ENCOUNTER — Encounter (HOSPITAL_COMMUNITY): Payer: Medicare Other

## 2011-01-21 ENCOUNTER — Ambulatory Visit (HOSPITAL_COMMUNITY): Payer: Medicare Other

## 2011-01-21 ENCOUNTER — Encounter (HOSPITAL_COMMUNITY): Payer: Medicare Other

## 2011-01-24 ENCOUNTER — Ambulatory Visit (HOSPITAL_COMMUNITY): Payer: Medicare Other

## 2011-01-24 ENCOUNTER — Encounter (HOSPITAL_COMMUNITY): Payer: Medicare Other

## 2011-01-26 ENCOUNTER — Encounter (HOSPITAL_COMMUNITY): Payer: Medicare Other

## 2011-01-26 ENCOUNTER — Ambulatory Visit (HOSPITAL_COMMUNITY): Payer: Medicare Other

## 2011-01-28 ENCOUNTER — Ambulatory Visit (HOSPITAL_COMMUNITY): Payer: Medicare Other

## 2011-01-28 ENCOUNTER — Encounter (HOSPITAL_COMMUNITY): Payer: Medicare Other

## 2011-02-01 ENCOUNTER — Other Ambulatory Visit: Payer: Self-pay | Admitting: Cardiology

## 2011-02-04 ENCOUNTER — Encounter (HOSPITAL_COMMUNITY): Payer: Medicare Other

## 2011-02-04 ENCOUNTER — Ambulatory Visit (HOSPITAL_COMMUNITY): Payer: Medicare Other

## 2011-02-11 ENCOUNTER — Ambulatory Visit (HOSPITAL_COMMUNITY): Payer: Medicare Other

## 2011-02-11 ENCOUNTER — Encounter (HOSPITAL_COMMUNITY): Payer: Medicare Other

## 2011-02-18 ENCOUNTER — Encounter (HOSPITAL_COMMUNITY): Payer: Medicare Other

## 2011-02-18 ENCOUNTER — Ambulatory Visit (HOSPITAL_COMMUNITY): Payer: Medicare Other

## 2011-02-25 ENCOUNTER — Ambulatory Visit (HOSPITAL_COMMUNITY): Payer: Medicare Other

## 2011-02-25 ENCOUNTER — Encounter (HOSPITAL_COMMUNITY): Payer: Medicare Other

## 2011-03-03 NOTE — Telephone Encounter (Signed)
Please close encounter

## 2011-03-04 ENCOUNTER — Ambulatory Visit (HOSPITAL_COMMUNITY): Payer: Medicare Other

## 2011-03-04 ENCOUNTER — Encounter (HOSPITAL_COMMUNITY): Payer: Medicare Other

## 2011-03-11 ENCOUNTER — Ambulatory Visit (HOSPITAL_COMMUNITY): Payer: Medicare Other

## 2011-03-11 ENCOUNTER — Encounter (HOSPITAL_COMMUNITY): Payer: Medicare Other

## 2011-03-18 ENCOUNTER — Ambulatory Visit (HOSPITAL_COMMUNITY): Payer: Medicare Other

## 2011-03-18 ENCOUNTER — Encounter (HOSPITAL_COMMUNITY): Payer: Medicare Other

## 2011-03-25 ENCOUNTER — Ambulatory Visit (HOSPITAL_COMMUNITY): Payer: Medicare Other

## 2011-03-25 ENCOUNTER — Encounter (HOSPITAL_COMMUNITY): Payer: Medicare Other

## 2011-04-01 ENCOUNTER — Ambulatory Visit (HOSPITAL_COMMUNITY): Payer: Medicare Other

## 2011-04-01 ENCOUNTER — Encounter (HOSPITAL_COMMUNITY): Payer: Medicare Other

## 2011-04-08 ENCOUNTER — Ambulatory Visit (HOSPITAL_COMMUNITY): Payer: Medicare Other

## 2011-04-08 ENCOUNTER — Encounter (HOSPITAL_COMMUNITY): Payer: Medicare Other

## 2011-04-15 ENCOUNTER — Ambulatory Visit (HOSPITAL_COMMUNITY): Payer: Medicare Other

## 2011-04-15 ENCOUNTER — Encounter (HOSPITAL_COMMUNITY): Payer: Medicare Other

## 2011-04-22 ENCOUNTER — Ambulatory Visit (HOSPITAL_COMMUNITY): Payer: Medicare Other

## 2011-04-22 ENCOUNTER — Encounter (HOSPITAL_COMMUNITY): Payer: Medicare Other

## 2011-04-29 ENCOUNTER — Encounter (HOSPITAL_COMMUNITY): Payer: Medicare Other

## 2011-04-29 ENCOUNTER — Ambulatory Visit (HOSPITAL_COMMUNITY): Payer: Medicare Other

## 2011-05-06 ENCOUNTER — Ambulatory Visit (HOSPITAL_COMMUNITY): Payer: Medicare Other

## 2011-05-06 ENCOUNTER — Encounter (HOSPITAL_COMMUNITY): Payer: Medicare Other

## 2011-05-13 ENCOUNTER — Encounter (HOSPITAL_COMMUNITY): Payer: Medicare Other

## 2011-05-13 ENCOUNTER — Ambulatory Visit (HOSPITAL_COMMUNITY): Payer: Medicare Other

## 2011-05-20 ENCOUNTER — Encounter (HOSPITAL_COMMUNITY): Payer: Medicare Other

## 2011-05-20 ENCOUNTER — Ambulatory Visit (HOSPITAL_COMMUNITY): Payer: Medicare Other

## 2011-05-27 ENCOUNTER — Ambulatory Visit (HOSPITAL_COMMUNITY): Payer: Medicare Other

## 2011-05-27 ENCOUNTER — Encounter (HOSPITAL_COMMUNITY): Payer: Medicare Other

## 2011-05-30 ENCOUNTER — Ambulatory Visit (INDEPENDENT_AMBULATORY_CARE_PROVIDER_SITE_OTHER): Payer: Medicare Other | Admitting: Internal Medicine

## 2011-05-30 ENCOUNTER — Encounter: Payer: Self-pay | Admitting: Internal Medicine

## 2011-05-30 VITALS — BP 118/70 | HR 68 | Temp 98.0°F | Resp 12 | Ht 64.75 in | Wt 144.6 lb

## 2011-05-30 DIAGNOSIS — K219 Gastro-esophageal reflux disease without esophagitis: Secondary | ICD-10-CM

## 2011-05-30 DIAGNOSIS — M899 Disorder of bone, unspecified: Secondary | ICD-10-CM

## 2011-05-30 DIAGNOSIS — Z23 Encounter for immunization: Secondary | ICD-10-CM

## 2011-05-30 DIAGNOSIS — E782 Mixed hyperlipidemia: Secondary | ICD-10-CM

## 2011-05-30 DIAGNOSIS — K5730 Diverticulosis of large intestine without perforation or abscess without bleeding: Secondary | ICD-10-CM

## 2011-05-30 DIAGNOSIS — Z0000 Encounter for general adult medical examination without abnormal findings: Secondary | ICD-10-CM

## 2011-05-30 DIAGNOSIS — R0609 Other forms of dyspnea: Secondary | ICD-10-CM

## 2011-05-30 DIAGNOSIS — I2510 Atherosclerotic heart disease of native coronary artery without angina pectoris: Secondary | ICD-10-CM

## 2011-05-30 LAB — LIPID PANEL
Cholesterol: 135 mg/dL (ref 0–200)
LDL Cholesterol: 55 mg/dL (ref 0–99)
Total CHOL/HDL Ratio: 2

## 2011-05-30 LAB — HEPATIC FUNCTION PANEL
ALT: 13 U/L (ref 0–35)
AST: 27 U/L (ref 0–37)
Alkaline Phosphatase: 53 U/L (ref 39–117)
Total Bilirubin: 0.5 mg/dL (ref 0.3–1.2)

## 2011-05-30 LAB — CBC WITH DIFFERENTIAL/PLATELET
Eosinophils Relative: 2.3 % (ref 0.0–5.0)
HCT: 36.8 % (ref 36.0–46.0)
Hemoglobin: 12.3 g/dL (ref 12.0–15.0)
Lymphocytes Relative: 24.2 % (ref 12.0–46.0)
Lymphs Abs: 1.1 10*3/uL (ref 0.7–4.0)
Monocytes Relative: 9.2 % (ref 3.0–12.0)
Neutro Abs: 2.8 10*3/uL (ref 1.4–7.7)
Platelets: 218 10*3/uL (ref 150.0–400.0)
WBC: 4.4 10*3/uL — ABNORMAL LOW (ref 4.5–10.5)

## 2011-05-30 LAB — BASIC METABOLIC PANEL
Calcium: 9.1 mg/dL (ref 8.4–10.5)
GFR: 71.28 mL/min (ref >60.00)
Potassium: 4.5 mEq/L (ref 3.5–5.1)
Sodium: 142 mEq/L (ref 135–145)

## 2011-05-30 LAB — TSH: TSH: 1.46 u[IU]/mL (ref 0.35–5.50)

## 2011-05-30 MED ORDER — RANITIDINE HCL 150 MG PO TABS: 150.0000 mg | ORAL_TABLET | Freq: Two times a day (BID) | ORAL | Status: DC

## 2011-05-30 NOTE — Patient Instructions (Signed)
The triggers for dyspepsia or "heart burn"  include stress; the "aspirin family" ; alcohol; peppermint; and caffeine (coffee, tea, cola, and chocolate). The aspirin family would include aspirin and the nonsteroidal agents such as ibuprofen &  Naproxen. Tylenol would not cause reflux. If having dyspepsia ; food & drink should be avoided for @ least 2 hours before going to bed.  Preventive Health Care: Exercise  30-45  minutes a day, 3-4 days a week if OKed by  Dr Shirlee Latch.Walking is especially valuable in preventing Osteoporosis. Eat a low-fat diet with lots of fruits and vegetables, up to 7-9 servings per day. Consume less than 30 grams of sugar per day from foods & drinks with High Fructose Corn Syrup as # 1,2,3 or #4 on label.

## 2011-05-30 NOTE — Progress Notes (Signed)
Subjective:    Patient ID: [name], female    DOB: 1941/02/13, 71 y.o.   MRN: [id number]  HPI Medicare Wellness Visit:  The following psychosocial & medical history were reviewed as required by Medicare.   Social history: caffeine: diet green tea 24 oz/day , alcohol:  rarely ,  tobacco use : never  & exercise : golf , calisthentics.   Home & personal  safety / fall risk: no issues, activities of daily living: see below , seatbelt use : yes , and smoke alarm employment : yes.  Power of Attorney/Living Will status : in place  Vision ( as recorded per Nurse) & Hearing  evaluation :  See exam. Orientation :oriented X 3, memory & recall :good, spelling  testing: good,and mood & affect : normal  . Depression / anxiety: denied Travel history : 02/2010 [place] , immunization status :PNA vaccine given , transfusion history:  no, and preventive health surveillance ( colonoscopies, BMD , etc as per protocol/ SOC): up to date, Dental care: seen every 6 mos . Chart reviewed &  Updated. Active issues reviewed & addressed.       Review of Systems Onset:since stenting 3/12 Context/character:both @ rest & DOE, especially up hill Severity/limitation:up to 3 scale/decreased stamina  Review of systems: Constitutional: No fever, chills, significant weight change, sleep dysfunction, fatigue, night sweats Cardiovascular: No chest pain, palpitations, racing, irregularity, syncope, diaphoresis, claudication Respiratory: No cough, sputum production,hemoptysis, pleurisy, hoarseness GI: Intermittent severe dyspepsia X3 since 05/04/2011.TUMS helped. No  dysphagia, melena, rectal bleeding  Endocrine: No skin/hair/nail changes Neuro/Muscular: No weakness, tremor, gait dysfunction Heme/lymph: No abnormal bruising, lymphadenopathy Allergy/immunologic: No rhinoconjunctivitis, angioedema Past medical history: No cardiopulmonary disease Smoking history: Family history:no cardiopulmonary disease.           Objective:   Physical Exam Gen.: Healthy and well-nourished in appearance. Alert, appropriate and cooperative throughout exam. Head: Normocephalic without obvious abnormalities Eyes: No corneal or conjunctival inflammation noted. Ptosis OS > OD. Pterygia ; OS > OD.Extraocular motion intact. Vision grossly normal. Ears: External  ear exam reveals no significant lesions or deformities. Canals clear .TMs normal. Hearing is grossly normal bilaterally. Nose: External nasal exam reveals no deformity or inflammation. Nasal mucosa are pink and moist. No lesions or exudates noted.  Mouth: Oral mucosa and oropharynx reveal no lesions or exudates. Teeth in good repair. Neck: No deformities, masses, or tenderness noted. Range of motion & Thyroid normal Lungs: Normal respiratory effort; chest expands symmetrically. Lungs are clear to auscultation without rales, wheezes, or increased work of breathing. Heart: Normal rate and rhythm. Normal S1 and S2. No gallop, click, or rub. S 4 w/o murmur. Abdomen: Bowel sounds normal; abdomen soft and nontender. No masses, organomegaly or hernias noted. Genitalia: Dr Arelia Sneddon       Musculoskeletal/extremities: Slight lordosis noted of  the thoracic  spine. No clubbing, cyanosis, or edema noted. Range of motion  normal .Tone & strength  Normal.Joints: DIP OA changes. Nail health  good. Vascular: Carotid, radial artery, dorsalis pedis and  posterior tibial pulses are full and equal. No bruits present. Varicose veins RLE. Neurologic: Alert and oriented x3. Deep tendon reflexes symmetrical and normal.          Skin: Intact without suspicious lesions or rashes. Lymph: No cervical, axillary  lymphadenopathy present. Psych: Mood and affect are normal. Normally interactive  Assessment & Plan:  #1 Medicare Wellness Exam; criteria met ; data entered #2 Problem List reviewed ; Assessment/  Recommendations made  #3 dyspnea; rest and exertional. No past history of asthma or reactive airways disease. Her son has  been diagnosed as having exercise-induced bronchospasm. She has an appointment to see her Cardiologist in the next 3 weeks. If her cardiac status is felt to be stable and not contributing to the symptoms; full pulmonary function tests can be completed.  #4 reflux, significant dyspepsia intermittently. Plan: see Orders

## 2011-05-31 LAB — VITAMIN D 25 HYDROXY (VIT D DEFICIENCY, FRACTURES): Vit D, 25-Hydroxy: 44 ng/mL (ref 30–89)

## 2011-06-03 ENCOUNTER — Encounter (HOSPITAL_COMMUNITY): Payer: Medicare Other

## 2011-06-03 ENCOUNTER — Ambulatory Visit (HOSPITAL_COMMUNITY): Payer: Medicare Other

## 2011-06-10 ENCOUNTER — Ambulatory Visit (HOSPITAL_COMMUNITY): Payer: Medicare Other

## 2011-06-10 ENCOUNTER — Encounter (HOSPITAL_COMMUNITY): Payer: Medicare Other

## 2011-06-17 ENCOUNTER — Encounter (HOSPITAL_COMMUNITY): Payer: Medicare Other

## 2011-06-17 ENCOUNTER — Ambulatory Visit (HOSPITAL_COMMUNITY): Payer: Medicare Other

## 2011-06-21 ENCOUNTER — Other Ambulatory Visit (INDEPENDENT_AMBULATORY_CARE_PROVIDER_SITE_OTHER): Payer: Medicare Other

## 2011-06-21 DIAGNOSIS — E785 Hyperlipidemia, unspecified: Secondary | ICD-10-CM

## 2011-06-21 LAB — LIPID PANEL
Cholesterol: 131 mg/dL (ref 0–200)
VLDL: 9 mg/dL (ref 0.0–40.0)

## 2011-06-21 LAB — HEPATIC FUNCTION PANEL
ALT: 10 U/L (ref 0–35)
AST: 23 U/L (ref 0–37)
Alkaline Phosphatase: 47 U/L (ref 39–117)
Bilirubin, Direct: 0 mg/dL (ref 0.0–0.3)
Total Bilirubin: 0.4 mg/dL (ref 0.3–1.2)
Total Protein: 6.6 g/dL (ref 6.0–8.3)

## 2011-06-24 ENCOUNTER — Encounter (HOSPITAL_COMMUNITY): Payer: Medicare Other

## 2011-06-24 ENCOUNTER — Ambulatory Visit (HOSPITAL_COMMUNITY): Payer: Medicare Other

## 2011-06-27 ENCOUNTER — Encounter: Payer: Self-pay | Admitting: Family Medicine

## 2011-06-27 ENCOUNTER — Ambulatory Visit (INDEPENDENT_AMBULATORY_CARE_PROVIDER_SITE_OTHER): Payer: Medicare Other | Admitting: Family Medicine

## 2011-06-27 VITALS — BP 120/69 | HR 71 | Temp 97.8°F | Ht 64.75 in | Wt 146.7 lb

## 2011-06-27 DIAGNOSIS — R2230 Localized swelling, mass and lump, unspecified upper limb: Secondary | ICD-10-CM | POA: Insufficient documentation

## 2011-06-27 DIAGNOSIS — R229 Localized swelling, mass and lump, unspecified: Secondary | ICD-10-CM

## 2011-06-27 NOTE — Patient Instructions (Signed)
This appears to be a ganglion cyst We will call you with your appt at the [facility] Call with any questions or concerns Hang in there!!!

## 2011-06-27 NOTE — Progress Notes (Signed)
  Subjective:    Patient ID: [name], female    DOB: 1940/09/04, 71 y.o.   MRN: [id number]  HPI 'bump on palm'- first noticed on Thursday while watching TV.  R hand.  Not painful.  Not mobile.  L hand dominant but does a lot w/ R hand- gardening, golf.   Review of Systems     Objective:   Physical Exam  Vitals reviewed. Constitutional: She appears well-developed and well-nourished. No distress.  Musculoskeletal:       Right hand: She exhibits deformity (1 cm firm soft tissue swelling in central palm along flexor tendon most consistent w/ ganglion cyst). She exhibits normal range of motion. Decreased sensation is not present in the ulnar distribution, is not present in the medial distribution and is not present in the radial distribution. Normal strength noted.          Assessment & Plan:

## 2011-06-28 ENCOUNTER — Ambulatory Visit: Payer: Medicare Other | Admitting: Cardiology

## 2011-06-28 NOTE — Assessment & Plan Note (Signed)
New.  Soft tissue mass most likely ganglion cyst.  Refer to hand specialist for tx.  Pt expressed understanding and is in agreement w/ plan.

## 2011-06-30 ENCOUNTER — Ambulatory Visit (INDEPENDENT_AMBULATORY_CARE_PROVIDER_SITE_OTHER): Payer: Medicare Other | Admitting: Cardiology

## 2011-06-30 ENCOUNTER — Encounter: Payer: Self-pay | Admitting: Cardiology

## 2011-06-30 VITALS — BP 111/72 | HR 68 | Ht 64.0 in | Wt 146.0 lb

## 2011-06-30 DIAGNOSIS — R0602 Shortness of breath: Secondary | ICD-10-CM

## 2011-06-30 DIAGNOSIS — I2510 Atherosclerotic heart disease of native coronary artery without angina pectoris: Secondary | ICD-10-CM

## 2011-06-30 DIAGNOSIS — E782 Mixed hyperlipidemia: Secondary | ICD-10-CM

## 2011-06-30 NOTE — Patient Instructions (Signed)
Hold metoprolol for 1 week. If it helps you shortness of breath stay off of it. If it does not help your shortness of breath Dr Shirlee Latch wants you to start taking it again.  Your physician has requested that you have en exercise stress myoview. For further information please visit [email or web address] Please follow instruction sheet, as given.  Your physician wants you to follow-up in: 6 months with Dr Shirlee Latch. (September 2013)You will receive a reminder letter in the mail two months in advance. If you don't receive a letter, please call our office to schedule the follow-up appointment.

## 2011-07-01 NOTE — Assessment & Plan Note (Signed)
LDL at goal <70

## 2011-07-01 NOTE — Progress Notes (Signed)
PCP: Dr. Alwyn Ren  71 yo with history of hyperlipidemia and CAD returns after PCI to RCA and LAD. Patient had noted new dyspnea when walking [place] the [place]. She was set up for an ETT. This showed below average exercise tolerance and diffuse ST depression in multiple leads. She was set up for LHC, which showed 80% stenosis of the proximal LAD at D2, 80% ostial D2, and subtotal occlusion of the proximal RCA with left to right collaterals. After extensive discussion, we decided on PCI to chronically totally occluded RCA as well as LAD rather than CABG. This was done in 3/12 with 1 Promus DES to the RCA and 1 Promus DES to the LAD.   Patient still gets short of breath when she walks [place] hills on the golf course.  This has been chronic since her PCI.  It does not seem to have improved any after PCI and may be worsening a bit.  She does still walk 9 holes of golf 3-4 times a week.  She occasionally gets a sensation of dyspnea at rest.  She has never had chest pain.  She has tolerated Crestor 5 mg alternating with 10 mg without myalgias.  She had myalgias with 10 mg daily.  She had intense pruritis with lisinopril that resolved after stopping the medication.   Labs (8/12): LDL 65, HDL 54 Labs (3/13): LDL 62, HDL 60, LFTs normal  Past Medical History:  1. Hyperlipidemia - intolerance to pravastatin and Crestor 10 mg daily (myalgias)  2. Osteopenia 3. Herpes zoster 1997 L flank  4. Diverticulosis, colon  5. CAD: Exertional dyspnea led to ETT in 2/12. This showed significant ST depression in a number of leads. She had LHC (3/12) showing 80% proximal LAD at D2, 80% ostial D2, and subtotal occlusion of a dominant RCA with good L=>R collaterals. EF 60% with basal inferior hypokinesis. Patient had PCI to RCA with 3 x 20 Promus DES and PCI to LAD with 2.75 x 16 Promus DES with good results.   Family History:  Father:Esophageal cancer , smoker (died @ 25)  Mother: CHF, diverticulosis, arthritis, skin  cancer ,died of CVA @ 3  Siblings: bro: excess tobacco & alcohol; P uncle pancreatic cancer  P Grandmother: CVA (died @ 50), pre-DM, DVT  MGM: cancer , ? stomach; M uncle: MI > 53  1st Cousin: RA  Grandson:?" hole in heart"  Cousin: Thalassemia   Social History:  Never Smoked;  Married  Alcohol use-yes: extremely rarely   ROS: All systems reviewed and negative except as per HPI.   Current Outpatient Prescriptions  Medication Sig Dispense Refill  . aspirin 81 MG tablet Take 81 mg by mouth daily.        . Black Currant Seed Oil 500 MG CAPS Take by mouth daily.       . Calcium Citrate-Vitamin D (CITRUS CALCIUM 1500 + D PO) Take by mouth daily.       . clopidogrel (PLAVIX) 75 MG tablet Take 75 mg by mouth daily.        . Coenzyme Q10 (COQ-10) 100 MG capsule Take 200 mg by mouth daily.       . CRESTOR 10 MG tablet TAKE 1 TABLET BY MOUTH EVERY EVENING AT BEDTIME  30 tablet  3  . metoprolol tartrate (LOPRESSOR) 25 MG tablet TAKE 1 TABLET BY MOUTH TWICE A DAY  60 tablet  6  . Multiple Vitamin (MULTIVITAMIN) capsule Take 1 capsule by mouth daily.        Marland Kitchen  nitroGLYCERIN (NITROSTAT) 0.4 MG SL tablet Place 0.4 mg under the tongue every 5 (five) minutes as needed.        . rosuvastatin (CRESTOR) 10 MG tablet Take 10 mg by mouth daily. Pt alternating 10 mg every other day with 5 mg on every other day         BP 111/72  Pulse 68  Ht 5\' 4"  (1.626 m)  Wt 146 lb (66.225 kg)  BMI 25.06 kg/m2 General: NAD Neck: No JVD, no thyromegaly or thyroid nodule.  Lungs: Clear to auscultation bilaterally with normal respiratory effort. CV: Nondisplaced PMI.  Heart regular S1/S2, no S3/S4, no murmur.  No peripheral edema.  No carotid bruit.  Normal pedal pulses.  Abdomen: Soft, nontender, no hepatosplenomegaly, no distention.  Neurologic: Alert and oriented x 3.  Psych: Normal affect. Extremities: No clubbing or cyanosis.

## 2011-07-01 NOTE — Assessment & Plan Note (Signed)
S/p PCI to LAD and RCA.  Continue ASA 81 and Plavix.  I will continue Plavix for another year as long as she tolerates it given multiple DES.  She is on metoprolol and statin. She was unable to tolerate lisinopril due to itching.  She has chronic dyspnea walking up inclines.  This seems to have increased some recently.  She never improved from the standpoint of dyspnea after the PCI.  She does not wheeze or have definite evidence for asthma.   - Hold metoprolol x 1 week to see if this helps dyspnea (? Possibility of beta blocker-induced bronchospasm).   - ETT-myoview to reassess for ischemia.  - If no answer from these steps, will consider CPX testing.

## 2011-07-10 ENCOUNTER — Other Ambulatory Visit: Payer: Self-pay | Admitting: Cardiology

## 2011-07-11 ENCOUNTER — Ambulatory Visit (HOSPITAL_COMMUNITY): Payer: Medicare Other | Attending: Cardiology | Admitting: Radiology

## 2011-07-11 VITALS — BP 128/70 | Ht 65.0 in | Wt 143.0 lb

## 2011-07-11 DIAGNOSIS — I2510 Atherosclerotic heart disease of native coronary artery without angina pectoris: Secondary | ICD-10-CM

## 2011-07-11 DIAGNOSIS — R0602 Shortness of breath: Secondary | ICD-10-CM

## 2011-07-11 DIAGNOSIS — R0989 Other specified symptoms and signs involving the circulatory and respiratory systems: Secondary | ICD-10-CM | POA: Insufficient documentation

## 2011-07-11 DIAGNOSIS — R0609 Other forms of dyspnea: Secondary | ICD-10-CM | POA: Insufficient documentation

## 2011-07-11 DIAGNOSIS — R9431 Abnormal electrocardiogram [ECG] [EKG]: Secondary | ICD-10-CM | POA: Insufficient documentation

## 2011-07-11 DIAGNOSIS — Z8249 Family history of ischemic heart disease and other diseases of the circulatory system: Secondary | ICD-10-CM | POA: Insufficient documentation

## 2011-07-11 DIAGNOSIS — E785 Hyperlipidemia, unspecified: Secondary | ICD-10-CM | POA: Insufficient documentation

## 2011-07-11 DIAGNOSIS — R002 Palpitations: Secondary | ICD-10-CM | POA: Insufficient documentation

## 2011-07-11 MED ORDER — TECHNETIUM TC 99M TETROFOSMIN IV KIT
33.0000 | PACK | Freq: Once | INTRAVENOUS | Status: AC | PRN
  Administered 2011-07-11: 33 via INTRAVENOUS

## 2011-07-11 MED ORDER — TECHNETIUM TC 99M TETROFOSMIN IV KIT: 10.0000 | PACK | Freq: Once | INTRAVENOUS | Status: DC | PRN

## 2011-07-11 MED ORDER — TECHNETIUM TC 99M TETROFOSMIN IV KIT
11.0000 | PACK | Freq: Once | INTRAVENOUS | Status: AC | PRN
  Administered 2011-07-11: 11 via INTRAVENOUS

## 2011-07-11 MED ORDER — TECHNETIUM TC 99M TETROFOSMIN IV KIT: 30.0000 | PACK | Freq: Once | INTRAVENOUS | Status: DC | PRN

## 2011-07-11 NOTE — Progress Notes (Addendum)
[facility] SITE 3 NUCLEAR MED [address] [place] [address] [phone]  Cardiology Nuclear Med Study  [name] is a 71 y.o. female     MRN : [id number]     DOB: 1941/01/06  Procedure Date: 07/11/2011  Nuclear Med Background Indication for Stress Test:  Evaluation for Ischemia and Stent Patency History: Abnormal EKG, 05/2010 GXT: ST depression in multiple leads, 06/2010 Heart Cath: 80% LAD/Occluded RCA--Stents EF: 60% Cardiac Risk Factors: Family History - CAD and Lipids  Symptoms:  DOE, Palpitations and SOB   Nuclear Pre-Procedure Caffeine/Decaff Intake:  None NPO After: 7:00pm   Lungs:  clear O2 Sat: 99% on room air. IV 0.9% NS with Angio Cath:  20g  IV Site: R Hand  IV Started by:  Cathlyn Parsons, RN  Chest Size (in):  36 Cup Size: B  Height: 5\' 5"  (1.651 m)  Weight:  143 lb (64.864 kg)  BMI:  Body mass index is 23.80 kg/(m^2). Tech Comments:  Lopressor has been dc'd x 7 days    Nuclear Med Study 1 or 2 day study: 1 day  Stress Test Type:  Stress  Reading MD: Willa Rough, MD  Order Authorizing Provider:  Fransico Meadow  Resting Radionuclide: Technetium 66m Tetrofosmin  Resting Radionuclide Dose: 11.0 mCi   Stress Radionuclide:  Technetium 2m Tetrofosmin  Stress Radionuclide Dose: 31.8 mCi           Stress Protocol Rest HR: 61 Stress HR: 146  Rest BP: 128/70 Stress BP: 199/70  Exercise Time (min): 5:01 METS: 7.0   Predicted Max HR: 151 bpm % Max HR: 96.69 bpm Rate Pressure Product: 21308   Dose of Adenosine (mg):  n/a Dose of Lexiscan: n/a mg  Dose of Atropine (mg): n/a Dose of Dobutamine: n/a mcg/kg/min (at max HR)  Stress Test Technologist: Milana Na, EMT-P  Nuclear Technologist:  Domenic Polite, CNMT     Rest Procedure:  Myocardial perfusion imaging was performed at rest 45 minutes following the intravenous administration of Technetium 1m Tetrofosmin. Rest ECG: NSR - Normal EKG  Stress Procedure:  The patient  performed treadmill exercise using a Bruce  Protocol for 5:01  minutes. The patient stopped due to fatigue and denied any chest pain.  There were no significant ST-T wave changes.  Technetium 42m Tetrofosmin was injected at peak exercise and myocardial perfusion imaging was performed after a brief delay. Stress ECG: No significant change from baseline ECG  QPS Raw Data Images:  Patient motion noted; appropriate software correction applied. Stress Images:  There are no diagnostic abnormalities. Rest Images:  The images are the same as stress. Subtraction (SDS):  No evidence of ischemia. Transient Ischemic Dilatation (Normal <1.22):  1.08 Lung/Heart Ratio (Normal <0.45):  0.32  Quantitative Gated Spect Images QGS EDV:  64 ml QGS ESV:  20 ml  Impression Exercise Capacity:  Fair exercise capacity. BP Response:  Normal blood pressure response. Clinical Symptoms:  No significant symptoms noted. ECG Impression:  No significant ST segment change suggestive of ischemia. Comparison with Prior Nuclear Study: No images to compare  Overall Impression:  Normal stress nuclear study.  LV Ejection Fraction: 69%.  LV Wall Motion:  Normal Wall Motion  Willa Rough, MD   No ischemia or infarction.  If stopping metoprolol did not help her shortness of breath, would have her do a cardiopulmonary stress test.   Marca Ancona 07/12/2011

## 2011-07-14 NOTE — Progress Notes (Signed)
Spoke with pt. Pt states her shortness of breath has improved and continues to improve off metoprolol. She does not want to consider cardiopulmonary stress test right now.

## 2011-08-25 ENCOUNTER — Other Ambulatory Visit: Payer: Self-pay | Admitting: Internal Medicine

## 2011-08-25 NOTE — Telephone Encounter (Signed)
Please advise, ? Should this rx be sent to patient's cardiologist

## 2011-08-25 NOTE — Telephone Encounter (Signed)
Dr.Hopper please advise for the requested medication is not on the med list

## 2011-08-25 NOTE — Telephone Encounter (Signed)
OK # 180, 1 bid pre meal

## 2011-08-25 NOTE — Telephone Encounter (Signed)
Does she need to stay on Plavix?

## 2011-10-25 ENCOUNTER — Other Ambulatory Visit: Payer: Self-pay | Admitting: Cardiology

## 2011-11-22 ENCOUNTER — Other Ambulatory Visit: Payer: Self-pay | Admitting: Internal Medicine

## 2011-11-25 ENCOUNTER — Other Ambulatory Visit: Payer: Self-pay | Admitting: Cardiology

## 2011-11-25 NOTE — Telephone Encounter (Signed)
New Problem:    Called in needing a refill of her clopidogrel (PLAVIX) 75 MG tablet filled at the pharmacy listed on her profile.  Please call back once the order has been placed.

## 2012-04-24 ENCOUNTER — Other Ambulatory Visit: Payer: Self-pay | Admitting: Cardiology

## 2012-05-31 ENCOUNTER — Telehealth: Payer: Self-pay | Admitting: Cardiology

## 2012-05-31 NOTE — Telephone Encounter (Signed)
New Prob    Pt is experiencing increased SOB. Scheduled appt with Lawson Fiscal 3/4.

## 2012-05-31 NOTE — Telephone Encounter (Signed)
Pt states the SOB is not unusual she has had this for a while , but progress  is getting worse. She functions fine, but two days ago she played golf fine. Yesterday she was unloading groceries from her car and talking to a friend when she got out of breath. This SOB is getting worse for the last 2 weeks. Pt has an appointment to see Norma Fredrickson NP on Tueasday 06/05/12. PT IS AWARE THAT IF SYMPTOMS GET WORSE SHE NEEDS TO GO TO the ER. Pt verbalized understanding.

## 2012-06-05 ENCOUNTER — Ambulatory Visit
Admission: RE | Admit: 2012-06-05 | Discharge: 2012-06-05 | Disposition: A | Discharge status: Home / Self Care | Payer: Medicare Other | Source: Ambulatory Visit | Attending: Nurse Practitioner | Admitting: Nurse Practitioner

## 2012-06-05 ENCOUNTER — Ambulatory Visit (INDEPENDENT_AMBULATORY_CARE_PROVIDER_SITE_OTHER): Payer: Medicare Other | Admitting: Nurse Practitioner

## 2012-06-05 ENCOUNTER — Encounter: Payer: Self-pay | Admitting: Nurse Practitioner

## 2012-06-05 VITALS — BP 118/62 | HR 68 | Ht 65.0 in | Wt 146.0 lb

## 2012-06-05 DIAGNOSIS — I259 Chronic ischemic heart disease, unspecified: Secondary | ICD-10-CM

## 2012-06-05 DIAGNOSIS — R0609 Other forms of dyspnea: Secondary | ICD-10-CM

## 2012-06-05 DIAGNOSIS — R0600 Dyspnea, unspecified: Secondary | ICD-10-CM

## 2012-06-05 DIAGNOSIS — I25810 Atherosclerosis of coronary artery bypass graft(s) without angina pectoris: Secondary | ICD-10-CM

## 2012-06-05 LAB — CBC WITH DIFFERENTIAL/PLATELET
Basophils Absolute: 0 10*3/uL (ref 0.0–0.1)
Basophils Relative: 0.1 % (ref 0.0–3.0)
Eosinophils Absolute: 0.1 10*3/uL (ref 0.0–0.7)
Eosinophils Relative: 1 % (ref 0.0–5.0)
HCT: 35.7 % — ABNORMAL LOW (ref 36.0–46.0)
Hemoglobin: 12 g/dL (ref 12.0–15.0)
Lymphocytes Relative: 14.9 % (ref 12.0–46.0)
Lymphs Abs: 1.3 10*3/uL (ref 0.7–4.0)
MCHC: 33.7 g/dL (ref 30.0–36.0)
MCV: 87.6 fl (ref 78.0–100.0)
Monocytes Absolute: 0.6 10*3/uL (ref 0.1–1.0)
Monocytes Relative: 7 % (ref 3.0–12.0)
Neutro Abs: 6.6 10*3/uL (ref 1.4–7.7)
Neutrophils Relative %: 77 % (ref 43.0–77.0)
Platelets: 222 10*3/uL (ref 150.0–400.0)
RBC: 4.07 Mil/uL (ref 3.87–5.11)
RDW: 14.3 % (ref 11.5–14.6)
WBC: 8.6 10*3/uL (ref 4.5–10.5)

## 2012-06-05 LAB — BASIC METABOLIC PANEL
BUN: 19 mg/dL (ref 6–23)
CO2: 25 mEq/L (ref 19–32)
Calcium: 9.1 mg/dL (ref 8.4–10.5)
Chloride: 108 mEq/L (ref 96–112)
Creatinine, Ser: 0.9 mg/dL (ref 0.4–1.2)
GFR: 65.63 mL/min (ref >60.00)
Glucose, Bld: 93 mg/dL (ref 70–99)
Potassium: 4.9 mEq/L (ref 3.5–5.1)
Sodium: 139 mEq/L (ref 135–145)

## 2012-06-05 NOTE — Patient Instructions (Addendum)
We are going to check labs today  We are going to arrange for an ultrasound of your heart  We are going to send you for a CXR today - go to [facility] on the first floor at [facility] - you can walk in  We are going to arrange for a cardiopulmonary stress test  See Dr. Shirlee Latch as planned  Stay on your current medicines for now  Call the [facility] office at [phone] if you have any questions, problems or concerns.

## 2012-06-05 NOTE — Progress Notes (Signed)
[name] Date of Birth: [birth year] Medical Record #[id number]  History of Present Illness: Ms. [name] is seen back today for a work in visit. She is seen for Dr. Shirlee Latch. She has a history of HLD and CAD. She has had prior PCI to the RCA and LAD but discussion of possible CABG was also addressed. Her other issues include HLD with some statin intolerances, osteopenia, and diverticulosis. Has had chronic issues with shortness of breath.   She was last here about a year ago. Was still having issues with her breathing that had not improved following her PCI. Myoview was updated and this was ok. She had a trial off of her Metoprolol and did improve. There were plans to do a cardiopulmonary stress test if she did not.   She comes in today. She is here alone. She has had recurrent shortness of breath since the first of the year. No cough. No swelling. It happens with and without exertion. Has occurred with talking and less with playing golf. Still tries to walk 3 to 4 times a week on the golf course if the weather permits. She and her husband are building a cabin at [facility] and she has been very physical with that endeavor and done ok. Her energy level is "great". She notes that she feels a need to yawn at times. She has had some lightheadedness. No recent CXR or labs noted. No past echo noted.   Current Outpatient Prescriptions on File Prior to Visit  Medication Sig Dispense Refill  . aspirin 81 MG tablet Take 81 mg by mouth daily.        . Black Currant Seed Oil 500 MG CAPS Take by mouth daily.       . Calcium Citrate-Vitamin D (CITRUS CALCIUM 1500 + D PO) Take by mouth daily.       . clopidogrel (PLAVIX) 75 MG tablet TAKE 1 TABLET BY MOUTH EVERY DAY  30 tablet  9  . Coenzyme Q10 (COQ-10) 100 MG capsule Take 200 mg by mouth daily.       . Multiple Vitamin (MULTIVITAMIN) capsule Take 1 capsule by mouth daily.        . nitroGLYCERIN (NITROSTAT) 0.4 MG SL tablet Place 0.4 mg under the tongue  every 5 (five) minutes as needed.        . rosuvastatin (CRESTOR) 10 MG tablet Take 5-10 mg by mouth daily. Pt alternating 10 mg every other day with 5 mg on every other day       No current facility-administered medications on file prior to visit.    Allergies  Allergen Reactions  . Lisinopril     Diffuse itching w/o rash or fever  . Pravastatin Sodium     REACTION: muscle pain in arms 2009  . Metoprolol     Shortness of breath    Past Medical History  Diagnosis Date  . Hyperlipidemia     intol. to statins in past  . Osteopenia     Dr Arelia Sneddon, Gyn  . Herpes zoster     1997 L flank  . Diverticulosis of colon   . CAD (coronary artery disease)     Past Surgical History  Procedure Laterality Date  . Varicose vein surgery  1971  . Cataract extraction      OD 02/2009 , Dr. Elmer Picker  . Cariac stents  06/19/2010    2 vessel; Dr Excell Seltzer  . Colonoscopy  2009    History  Smoking status  .  Never Smoker   Smokeless tobacco  . Never Used    History  Alcohol Use  . Yes    Comment: extremely rarely    Family History  Problem Relation Age of Onset  . Arthritis Mother     OA  . Cancer Mother     skin  . Heart disease Father     ? MI @ 57  . Heart failure Father   . Heart attack Maternal Uncle 59  . Cancer Maternal Grandmother     ? GI  . Asthma Son     EIB    Review of Systems: The review of systems is per the HPI.  All other systems were reviewed and are negative.  Physical Exam: BP 118/62  Pulse 68  Ht 5\' 5"  (1.651 m)  Wt 146 lb (66.225 kg)  BMI 24.3 kg/m2 Patient is very pleasant and in no acute distress. Her weight is unchanged. Oxygen level was 100% on RA. Skin is warm and dry. Color is normal.  HEENT is unremarkable. Normocephalic/atraumatic. PERRL. Sclera are nonicteric. Neck is supple. No masses. No JVD. Lungs are clear. Cardiac exam shows a regular rate and rhythm. Abdomen is soft. Extremities are without edema. Gait and ROM are intact. No gross  neurologic deficits noted.  LABORATORY DATA: EKG shows sinus rhythm with no acute changes.   Lab Results  Component Value Date   WBC 4.4* 05/30/2011   HGB 12.3 05/30/2011   HCT 36.8 05/30/2011   PLT 218.0 05/30/2011   GLUCOSE 86 05/30/2011   CHOL 131 06/21/2011   TRIG 45.0 06/21/2011   HDL 59.80 06/21/2011   LDLDIRECT 158.8 05/24/2010   LDLCALC 62 06/21/2011   ALT 10 06/21/2011   AST 23 06/21/2011   NA 142 05/30/2011   K 4.5 05/30/2011   CL 111 05/30/2011   CREATININE 0.8 05/30/2011   BUN 14 05/30/2011   CO2 26 05/30/2011   TSH 1.46 05/30/2011   INR 1.0 06/10/2010   HGBA1C 5.4 02/08/2006     Myoview from April 2013 Overall Impression:  Normal stress nuclear study. LV Ejection Fraction: 69%.  LV Wall Motion:  Normal Wall Motion   Willa Rough, MD  No ischemia or infarction.  If stopping metoprolol did not help her shortness of breath, would have her do a cardiopulmonary stress test.    Marca Ancona 07/12/2011       Jacqlyn Krauss, RN at 07/14/2011 12:36 PM  Status: Signed         Spoke with pt. Pt states her shortness of breath has improved and continues to improve off metoprolol. She does not want to consider cardiopulmonary stress test right now.      Assessment / Plan: 1. Dyspnea - questionable etiology - will check CXR, labs and arrange for echo and CPX testing. Further disposition to follow. She has follow up planned with Dr. Shirlee Latch in one month. I have left her on her current regimen.   2. CAD - no chest pain reported. Apparently did not have much of a chest "pain" syndrome with her PCIs. Last Myoview 11 months ago was ok. Her energy level is "great". May need to consider repeating if her other studies are unremarkable.   3. HLD - on Crestor.  Patient is agreeable to this plan and will call if any problems develop in the interim.

## 2012-06-06 LAB — BRAIN NATRIURETIC PEPTIDE: Pro B Natriuretic peptide (BNP): 63 pg/mL (ref 0.0–100.0)

## 2012-06-07 ENCOUNTER — Ambulatory Visit (INDEPENDENT_AMBULATORY_CARE_PROVIDER_SITE_OTHER): Payer: Medicare Other | Admitting: Internal Medicine

## 2012-06-07 ENCOUNTER — Encounter: Payer: Self-pay | Admitting: Internal Medicine

## 2012-06-07 VITALS — BP 116/70 | HR 72 | Temp 98.1°F | Wt 145.0 lb

## 2012-06-07 DIAGNOSIS — R350 Frequency of micturition: Secondary | ICD-10-CM

## 2012-06-07 DIAGNOSIS — N390 Urinary tract infection, site not specified: Secondary | ICD-10-CM

## 2012-06-07 DIAGNOSIS — R300 Dysuria: Secondary | ICD-10-CM

## 2012-06-07 LAB — POCT URINALYSIS DIPSTICK
Ketones, UA: NEGATIVE
Protein, UA: NEGATIVE
Spec Grav, UA: 1.015
pH, UA: 6

## 2012-06-07 MED ORDER — PHENAZOPYRIDINE HCL 200 MG PO TABS: 200.0000 mg | ORAL_TABLET | Freq: Three times a day (TID) | ORAL | Status: DC | PRN

## 2012-06-07 MED ORDER — NITROFURANTOIN MONOHYD MACRO 100 MG PO CAPS: 100.0000 mg | ORAL_CAPSULE | Freq: Two times a day (BID) | ORAL | Status: DC

## 2012-06-07 NOTE — Progress Notes (Signed)
  Subjective:    Patient ID: [name], female    DOB: 05/11/1940, 72 y.o.   MRN: [id number]  HPI GU symptoms Onset:  05/31/12 as dysuria,hesitancy  & frequency   Course:progressive w/o new symptoms      Red Flags  : (Risk Factors for Complicated UTI) Recent Antibiotic Usage (last 30 days):No More than 3 UTI's last 12 months: No PMH of  1. DM: No 2. Renal Disease/Calculi: No 3. Urinary Tract Abnormality: No 4. Instrumentation/Trauma: No 5. Immunosuppression: No    Review of Systems  Urgency: No  Hematuria: No Flank Pain: No  Fever: No    Nausea/Vomiting:No  Discharge: No        Objective:   Physical Exam General appearance is one of good health and nourishment w/o distress.  Eyes: No conjunctival inflammation or scleral icterus is present.  Oral exam: Dental hygiene is good; lips and gums are healthy appearing.There is no oropharyngeal erythema or exudate noted.   Heart:  Normal rate and regular rhythm. S1 and S2 normal without gallop, murmur, click, rub or other extra sounds . S4  Lungs:Chest clear to auscultation; no wheezes, rhonchi,rales ,or rubs present.No increased work of breathing.   Abdomen: bowel sounds normal, soft and non-tender without masses, organomegaly or hernias noted.  No guarding or rebound . No flank tenderness  Skin:Warm & dry.  Intact without suspicious lesions or rashes ; no jaundice or tenting  Lymphatic: No lymphadenopathy is noted about the head, neck, axilla.             Assessment & Plan:  #1 UTI Plan: See orders and recommendations

## 2012-06-07 NOTE — Patient Instructions (Addendum)
Stay well hydrated. Drink to thirst up to 40 ounces of fluids daily.Drink as much nondairy fluids as possible. Avoid spicy foods or alcohol as  these may aggravate the bladder. Do not take decongestants. Avoid narcotics if possible.  If you activate the  My Chart system; lab & Xray results will be released directly  to you as soon as I review & address these through the computer. As per [place] all records must be reviewed and signed off within 72 hours; but I attempt to complete this within 36 hours unless I have no access to the electronic medical record.If I wait more than 36 hours the volume of reports becomes difficult to manage optimally. In my absence ;my partners would address the results.Critical lab results will be communicated to you ASAP. If you choose not to sign up for My Chart within 36 hours of labs being drawn; results will be reviewed & interpretation added before being copied & mailed, causing a delay in getting the results to you.If you do not receive that report within 7-10 days ,please call. Additionally you can use this system to gain direct  access to your records  if  out of town or @ an office of a  physician who is not in  the My Chart network.  This improves continuity of care & places you in control of your medical record.

## 2012-06-11 LAB — URINE CULTURE

## 2012-06-13 ENCOUNTER — Ambulatory Visit (HOSPITAL_COMMUNITY): Payer: Medicare Other | Attending: Nurse Practitioner | Admitting: Radiology

## 2012-06-13 DIAGNOSIS — I259 Chronic ischemic heart disease, unspecified: Secondary | ICD-10-CM | POA: Insufficient documentation

## 2012-06-13 DIAGNOSIS — R0989 Other specified symptoms and signs involving the circulatory and respiratory systems: Secondary | ICD-10-CM | POA: Insufficient documentation

## 2012-06-13 DIAGNOSIS — I359 Nonrheumatic aortic valve disorder, unspecified: Secondary | ICD-10-CM | POA: Insufficient documentation

## 2012-06-13 DIAGNOSIS — R0609 Other forms of dyspnea: Secondary | ICD-10-CM | POA: Insufficient documentation

## 2012-06-13 DIAGNOSIS — I079 Rheumatic tricuspid valve disease, unspecified: Secondary | ICD-10-CM | POA: Insufficient documentation

## 2012-06-13 NOTE — Progress Notes (Signed)
Echocardiogram performed.  

## 2012-06-14 ENCOUNTER — Ambulatory Visit (HOSPITAL_COMMUNITY): Payer: Medicare Other | Attending: Cardiology

## 2012-06-14 DIAGNOSIS — R0600 Dyspnea, unspecified: Secondary | ICD-10-CM

## 2012-06-14 DIAGNOSIS — R0989 Other specified symptoms and signs involving the circulatory and respiratory systems: Secondary | ICD-10-CM | POA: Insufficient documentation

## 2012-06-14 DIAGNOSIS — I259 Chronic ischemic heart disease, unspecified: Secondary | ICD-10-CM | POA: Insufficient documentation

## 2012-06-14 DIAGNOSIS — R0609 Other forms of dyspnea: Secondary | ICD-10-CM | POA: Insufficient documentation

## 2012-06-25 ENCOUNTER — Encounter: Payer: Self-pay | Admitting: Internal Medicine

## 2012-06-25 ENCOUNTER — Ambulatory Visit (INDEPENDENT_AMBULATORY_CARE_PROVIDER_SITE_OTHER): Payer: Medicare Other | Admitting: Internal Medicine

## 2012-06-25 VITALS — BP 124/72 | HR 68 | Temp 98.0°F | Resp 14 | Ht 64.08 in | Wt 145.0 lb

## 2012-06-25 DIAGNOSIS — R0989 Other specified symptoms and signs involving the circulatory and respiratory systems: Secondary | ICD-10-CM

## 2012-06-25 DIAGNOSIS — D649 Anemia, unspecified: Secondary | ICD-10-CM

## 2012-06-25 DIAGNOSIS — Z0000 Encounter for general adult medical examination without abnormal findings: Secondary | ICD-10-CM

## 2012-06-25 DIAGNOSIS — E782 Mixed hyperlipidemia: Secondary | ICD-10-CM

## 2012-06-25 DIAGNOSIS — C4491 Basal cell carcinoma of skin, unspecified: Secondary | ICD-10-CM | POA: Insufficient documentation

## 2012-06-25 DIAGNOSIS — R0609 Other forms of dyspnea: Secondary | ICD-10-CM

## 2012-06-25 LAB — HEPATIC FUNCTION PANEL
ALT: 13 U/L (ref 0–35)
AST: 24 U/L (ref 0–37)
Alkaline Phosphatase: 55 U/L (ref 39–117)
Bilirubin, Direct: 0 mg/dL (ref 0.0–0.3)
Total Bilirubin: 0.7 mg/dL (ref 0.3–1.2)

## 2012-06-25 LAB — CBC WITH DIFFERENTIAL/PLATELET
Basophils Relative: 0.3 % (ref 0.0–3.0)
Eosinophils Absolute: 0.1 10*3/uL (ref 0.0–0.7)
HCT: 35.8 % — ABNORMAL LOW (ref 36.0–46.0)
Hemoglobin: 12.5 g/dL (ref 12.0–15.0)
Lymphs Abs: 1.1 10*3/uL (ref 0.7–4.0)
MCHC: 35 g/dL (ref 30.0–36.0)
MCV: 87.7 fl (ref 78.0–100.0)
Monocytes Absolute: 0.4 10*3/uL (ref 0.1–1.0)
Neutro Abs: 2.6 10*3/uL (ref 1.4–7.7)
Neutrophils Relative %: 61.8 % (ref 43.0–77.0)
RBC: 4.09 Mil/uL (ref 3.87–5.11)

## 2012-06-25 LAB — LIPID PANEL
LDL Cholesterol: 67 mg/dL (ref 0–99)
Total CHOL/HDL Ratio: 3
VLDL: 17.8 mg/dL (ref 0.0–40.0)

## 2012-06-25 LAB — TSH: TSH: 1.72 u[IU]/mL (ref 0.35–5.50)

## 2012-06-25 NOTE — Patient Instructions (Addendum)
Preventive Health Care: Exercise  30-45  minutes a day, 3-4 days a week. Walking is especially valuable in preventing Osteoporosis. Eat a low-fat diet with lots of fruits and vegetables, up to 7-9 servings per day. This would eliminate need for vitamin supplements for most individuals. Consume less than 30 grams of sugar per day from foods & drinks with High Fructose Corn Syrup as #2,3 or #4 on label.

## 2012-06-25 NOTE — Progress Notes (Signed)
Subjective:    Patient ID: [name], female    DOB: 27-Dec-1940, 72 y.o.   MRN: [id number]  HPI  Medicare Wellness Visit:  Psychosocial & medical history were reviewed as required by Medicare (abuse,antisocial behavioral risks,firearm risk).  Social history: caffeine: 1 candy bar & 8 oz tea/ day , alcohol: extremely rarely ,  tobacco use: never  Exercise :  No regular No home & personal  safety / fall risk Activities of daily living: no limitations  Seatbelt  and smoke alarm employed. Power of Attorney/Living Will status : in place Ophthalmology exam current Hearing evaluation not current Orientation :oriented X 3  Memory & recall :good Math testing:good Mood & affect : normal . Depression / anxiety: denied Travel history : last  2011 [place]  Immunization status : Shingles needed  Transfusion history:  none  Preventive health surveillance ( colonoscopies, BMD , mammograms,PAP as per protocol/ SOC):all current  Dental care:  Every 6 mos. Chart reviewed &  Updated. Active issues reviewed & addressed.    Review of Systems  She continues to have dyspnea; this is described as more of an orthopnea with a sensation of incomplete breath. She denies activity restriction. As noted she's not been on a regular exercise program. Chest x-ray and pulmonary stress test were reviewed. She had mild diastolic dysfunction with suggestion of mild obstruction on spirometry component. Chest x-ray revealed no active process  She has never smoked. Father had emphysema based on multi-decades of smoking. Her son has exercise-induced bronchospasm  Extensive labs including BMP were normal except for minimal anemia; hematocrit was 35.7. She describes lifelong anemia. She denies melena or rectal bleeding. Her colonoscopy is up-to-date     Objective:   Physical Exam Gen.: Healthy and well-nourished in appearance. Alert, appropriate and cooperative throughout exam.Appears younger than stated age  Head:  Normocephalic without obvious abnormalities  Eyes: No corneal or conjunctival inflammation noted. Pupils equal round reactive to light and accommodation. Ptosis bilaterally. Extraocular motion intact. Vision grossly normal without lenses Ears: External  ear exam reveals no significant lesions or deformities. Canals clear .TMs normal. Hearing is grossly normal bilaterally. Nose: External nasal exam reveals no deformity or inflammation. Nasal mucosa are pink and moist. No lesions or exudates noted.   Mouth: Oral mucosa and oropharynx reveal no lesions or exudates. Teeth in good repair. Neck: No deformities, masses, or tenderness noted. Range of motion & Thyroid normal. Lungs: Normal respiratory effort; chest expands symmetrically. Lungs are clear to auscultation without rales, wheezes, or increased work of breathing. Heart: Normal rate and rhythm. Normal S1 and S2. No gallop, click, or rub. S4 with slurring vs Grade 1/2  Murmur @ R base  Abdomen: Bowel sounds normal; abdomen soft and nontender. No masses, organomegaly or hernias noted. Genitalia: As per Gyn                                  Musculoskeletal/extremities: No deformity or scoliosis noted of  the thoracic or lumbar spine.  No clubbing, cyanosis, edema, or significant extremity  deformity noted. Range of motion normal .Tone & strength  Normal. Minor DIP DJD changes . Nail health good. Able to lie down & sit up w/o help. Negative SLR bilaterally Vascular: Carotid, radial artery, dorsalis pedis and  posterior tibial pulses are full and equal. No bruits present. Neurologic: Alert and oriented x3. Deep tendon reflexes symmetrical and normal.  Skin: Intact without suspicious lesions or rashes. Lymph: No cervical, axillary lymphadenopathy present. Psych: Mood and affect are normal. Normally interactive                                                                                        Assessment & Plan:  #1 Medicare Wellness  Exam; criteria met ; data entered #2 dyspnea in content of being sedentary & mild anemia #3 Problem List reviewed ; Assessment/ Recommendations made Plan: see Orders

## 2012-07-04 ENCOUNTER — Encounter: Payer: Self-pay | Admitting: Cardiology

## 2012-07-04 ENCOUNTER — Ambulatory Visit (INDEPENDENT_AMBULATORY_CARE_PROVIDER_SITE_OTHER): Payer: Medicare Other | Admitting: Cardiology

## 2012-07-04 VITALS — BP 126/68 | HR 78 | Ht 65.0 in | Wt 144.4 lb

## 2012-07-04 DIAGNOSIS — I2510 Atherosclerotic heart disease of native coronary artery without angina pectoris: Secondary | ICD-10-CM

## 2012-07-04 DIAGNOSIS — R0609 Other forms of dyspnea: Secondary | ICD-10-CM

## 2012-07-04 DIAGNOSIS — R0989 Other specified symptoms and signs involving the circulatory and respiratory systems: Secondary | ICD-10-CM

## 2012-07-04 DIAGNOSIS — E782 Mixed hyperlipidemia: Secondary | ICD-10-CM

## 2012-07-04 NOTE — Patient Instructions (Addendum)
Stop Plavix.  Your physician wants you to follow-up in: 1 year with Dr Shirlee Latch. (April 2015).  You will receive a reminder letter in the mail two months in advance. If you don't receive a letter, please call our office to schedule the follow-up appointment.

## 2012-07-05 NOTE — Progress Notes (Signed)
Patient ID: [name], female   DOB: January 21, 1941, 72 y.o.   MRN: [id number] PCP: Dr. Alwyn Ren  72 yo with history of hyperlipidemia and CAD returns after PCI to RCA and LAD. Patient had noted new dyspnea when walking [place] golf [place] in early 2012. She was set up for an ETT. This showed below average exercise tolerance and diffuse ST depression in multiple leads. She was set up for LHC, which showed 80% stenosis of the proximal LAD at D2, 80% ostial D2, and subtotal occlusion of the proximal RCA with left to right collaterals. After extensive discussion, we decided on PCI to chronically totally occluded RCA as well as LAD rather than CABG. This was done in 3/12 with 1 Promus DES to the RCA and 1 Promus DES to the LAD.   When I last saw her about a year ago, she reported recurrent exertional dyspnea.  I did an ETT-Myoview in 4/13 that showed no ischemia or infarction.  I stopped her beta blocker and she seemed to feel better.  However, early this year, she began to note dyspnea again.  This time, it was not necessarily with exertion.  She could get suddenly short of breath just watching TV.  Of note, she has not been very active this winter with minimal exercise.  No chest pain.  She saw Norma Fredrickson.  Echocardiogram was done, which showed normal EF with mild diastolic dysfunction.  CXR was normal.  Cardiopulmonary exercise test was also done.  This was actually basically a normal study, showing no significant cardiac or pulmonary problem.  Over the last couple of weeks, she has started walking 2 miles a day.  She says that this has helped a lot.  She is not getting short of breath now.    Labs (8/12): LDL 65, HDL 54 Labs (3/13): LDL 62, HDL 60, LFTs normal Labs (4/13): TSH normal, HCT 36, LDL 67, HDL 56  Past Medical History:  1. Hyperlipidemia - intolerance to pravastatin and Crestor 10 mg daily (myalgias)  2. Osteopenia 3. Herpes zoster 1997 L flank  4. Diverticulosis, colon  5. CAD:  Exertional dyspnea led to ETT in 2/12. This showed significant ST depression in a number of leads. She had LHC (3/12) showing 80% proximal LAD at D2, 80% ostial D2, and subtotal occlusion of a dominant RCA with good L=>R collaterals. EF 60% with basal inferior hypokinesis. Patient had PCI to RCA with 3 x 20 Promus DES and PCI to LAD with 2.75 x 16 Promus DES with good results.  ETT-Myoview (4/13): EF 69%, no ischemia or infarction.  Echo (3/14): EF 60-65%, mild diastolic dysfunction.  CPX (3/14): VO2 max 19 (95% predicted), VE/VCO2 slope 28, RER 1.1, mild obstruction on PFTs.    Family History:  Father:Esophageal cancer , smoker (died @ 9)  Mother: CHF, diverticulosis, arthritis, skin cancer ,died of CVA @ 53  Siblings: bro: excess tobacco & alcohol; P uncle pancreatic cancer  P Grandmother: CVA (died @ 35), pre-DM, DVT  MGM: cancer , ? stomach; M uncle: MI > 64  1st Cousin: RA  Grandson:?" hole in heart"  Cousin: Thalassemia   Social History:  Never Smoked;  Married  Alcohol use-yes: extremely rarely.   Current Outpatient Prescriptions  Medication Sig Dispense Refill  . aspirin 81 MG tablet Take 81 mg by mouth daily.        . Black Currant Seed Oil 500 MG CAPS Take by mouth daily.       Marland Kitchen  Calcium Citrate-Vitamin D (CITRUS CALCIUM 1500 + D PO) Take by mouth daily.       . Coenzyme Q10 (COQ10) 200 MG CAPS Take by mouth daily.      . Multiple Vitamin (MULTIVITAMIN) capsule Take 1 capsule by mouth daily.        . nitroGLYCERIN (NITROSTAT) 0.4 MG SL tablet Place 0.4 mg under the tongue every 5 (five) minutes as needed.        . ranitidine (ZANTAC) 150 MG tablet Take 150 mg by mouth at bedtime.       . rosuvastatin (CRESTOR) 10 MG tablet Take 5-10 mg by mouth daily. Pt alternating 10 mg every other day with 5 mg on every other day       No current facility-administered medications for this visit.    BP 126/68  Pulse 78  Ht 5\' 5"  (1.651 m)  Wt 144 lb 6.4 oz (65.499 kg)  BMI 24.03 kg/m2   SpO2 97% General: NAD Neck: No JVD, no thyromegaly or thyroid nodule.  Lungs: Clear to auscultation bilaterally with normal respiratory effort. CV: Nondisplaced PMI.  Heart regular S1/S2, no S3/S4, no murmur.  No peripheral edema.  No carotid bruit.  Normal pedal pulses.  Abdomen: Soft, nontender, no hepatosplenomegaly, no distention.  Neurologic: Alert and oriented x 3.  Psych: Normal affect. Extremities: No clubbing or cyanosis.   Assessment/Plan: 1. CAD: I do not think that her recent dyspnea spells were due to ischemia.  Last myoview in 4/13 was normal.  Echo in 3/14 was unremarkable.  No dyspnea now that she is exercising regularly.  She will continue ASA 81 and Crestor.  She is off metoprolol as it seemed to make her feel fatigued and dyspneic.  It has been 2 years since she had Promus DES x 2 placed.  At this point, she can stop Plavix.  2. Dyspnea: I think this was likely related to deconditioning.  CPX done in 3/14 showed no significant cardiac pulmonary limitation. She feels much better since she started exercising again.  Would encourage regular exercise.  3. Hyperlipidemia: Continue Crestor.  Excellent lipids in 3/14.   Marca Ancona 07/05/2012

## 2012-07-12 ENCOUNTER — Other Ambulatory Visit: Payer: Self-pay | Admitting: Cardiology

## 2012-10-31 ENCOUNTER — Ambulatory Visit (INDEPENDENT_AMBULATORY_CARE_PROVIDER_SITE_OTHER): Payer: Medicare Other | Admitting: Internal Medicine

## 2012-10-31 ENCOUNTER — Encounter: Payer: Self-pay | Admitting: Internal Medicine

## 2012-10-31 VITALS — BP 124/80 | HR 113 | Temp 98.0°F | Wt 142.0 lb

## 2012-10-31 DIAGNOSIS — M25559 Pain in unspecified hip: Secondary | ICD-10-CM

## 2012-10-31 DIAGNOSIS — G8929 Other chronic pain: Secondary | ICD-10-CM

## 2012-10-31 DIAGNOSIS — K148 Other diseases of tongue: Secondary | ICD-10-CM

## 2012-10-31 MED ORDER — TRAMADOL HCL 50 MG PO TABS: 50.0000 mg | ORAL_TABLET | Freq: Four times a day (QID) | ORAL | Status: DC | PRN

## 2012-10-31 NOTE — Progress Notes (Signed)
Subjective:    Patient ID: [name], female    DOB: 1940-09-28, 72 y.o.   MRN: [id number]  HPI She noted a BB sized lesion with a pus pocket of the  R lateral  tongue  4-5 days ago. It was described as sore; this improved with nonsteroidal administration. The lesion has dramatically improved as of today. Her concern is that she has similar lesion in the same area 2-3 months ago which resolved completely.  She's also had pain in the left hip for one-2 years. This does appear to be progressing. He is described as intermittent,non radiating &  aching up to a level VI. It is worse with prolonged sitting such as using her computer. It is not aggravated by walking. She takes nonsteroidal agents 3 days a week on average, 4-6 pills total. Significantly she is on prophylactic 81 mg aspirin for coronary artery disease.     Review of Systems  She denies fever, chills, or sweats.She's had no other skin lesions. She denies abnormal bruising or bleeding. There's been no unexplained weight loss.  She denies any color or temperature change in the area and hip pain. There's been no visible swelling. She also has no rash in this area.  There is no associated numbness, tingling, or weakness in the leg. She has no incontinence of urine or stool. This has not affected her gait or balance. She denies any pain in the lumbosacral area which radiates anteriorly to the hip or inguinal area.     Objective:   Physical Exam  Gen.: Healthy and well-nourished in appearance. Alert, appropriate and cooperative throughout exam.Appears younger than stated age   Eyes: No corneal or conjunctival inflammation noted.  Ears: External  ear exam reveals no significant lesions or deformities. Canals clear .TMs normal. Nose: External nasal exam reveals no deformity or inflammation. Nasal mucosa are pink and moist. No lesions or exudates noted.  Mouth: Oral mucosa and oropharynx reveal no lesions or exudates. Teeth in good  repair. There is a tiny papule over the lateral aspect of the tongue which is not erythematous or tender. There is no evidence of purulence. Neck: No deformities, masses, or tenderness noted. Range of motion & Thyroid normal                              Musculoskeletal/extremities: No deformity or scoliosis noted of  the thoracic or lumbar spine.  No clubbing, cyanosis, edema, or significant extremity  deformity noted. Range of motion normal .there is some popping in the knees with range of motion. There is no effusion present. There is no limitation of range of motion of the left hip. Tone & strength  Normal. Joints normal . Nail health good. Able to lie down & sit up w/o help. Negative SLR bilaterally  Neurologic: Alert and oriented x3. Deep tendon reflexes symmetrical and normal.  Gait normal  including heel & toe walking .        Skin: Intact without suspicious lesions or rashes. Lymph: No cervical, axillary lymphadenopathy present. Psych: Mood and affect are normal. Normally interactive  Assessment & Plan:  #1 recurrent lesion right lateral tongue which has a history of resolving completely without sequela. This is most likely a traumatic phenomena from her biting her tongue possibly while asleep. As it is resolving it should simply be monitored. If it recurs she should call immediately for referral to laryngology. She should also discuss this with her dentist. #2 L hip pain; this may represent bursitis or piriformis syndrome. Problematic is the nonsteroidal she takes which negates the prophylactic benefit of low-dose aspirin. A trial of tramadol will be recommended. If symptoms persist orthopedic consultation would be appropriate.

## 2012-10-31 NOTE — Patient Instructions (Addendum)
Share results with all non [facility] medical  & dental staff seen .

## 2012-12-14 ENCOUNTER — Other Ambulatory Visit: Payer: Self-pay | Admitting: Internal Medicine

## 2012-12-17 ENCOUNTER — Encounter: Payer: Self-pay | Admitting: Lab

## 2012-12-18 ENCOUNTER — Ambulatory Visit (INDEPENDENT_AMBULATORY_CARE_PROVIDER_SITE_OTHER): Payer: Medicare Other | Admitting: Internal Medicine

## 2012-12-18 VITALS — BP 140/78 | HR 72 | Resp 13 | Ht 64.25 in | Wt 141.4 lb

## 2012-12-18 DIAGNOSIS — M6530 Trigger finger, unspecified finger: Secondary | ICD-10-CM

## 2012-12-18 DIAGNOSIS — M65311 Trigger thumb, right thumb: Secondary | ICD-10-CM

## 2012-12-18 DIAGNOSIS — M654 Radial styloid tenosynovitis [de Quervain]: Secondary | ICD-10-CM

## 2012-12-18 DIAGNOSIS — M720 Palmar fascial fibromatosis [Dupuytren]: Secondary | ICD-10-CM

## 2012-12-18 NOTE — Progress Notes (Signed)
  Subjective:    Patient ID: [name], female    DOB: 07-11-1940, 72 y.o.   MRN: [id number]  HPI  She awoke approximately one month ago noted triggering of the right thumb. This is also associated with some discomfort in the PIP and MCP joints of the right as well  There was no trigger or injury prior to the onset of symptoms.  She has had increased pain at the base of the right thumb playing golf despite wearing the brace. She is altered grip using the third, fourth and fifth fingers to grasp the club  She has taken tramadol and worn a support brace with some benefit.  Significant past history includes Dupuytren's of the right palm.  Her mother had degenerative joint disease    Review of Systems  She denies redness or swelling in the joints. She's also had no change in temperature or color of the skin of these areas. She has no fever, chills, sweats, weight loss.     Objective:   Physical Exam  She appears healthy &  well nourished. She appears younger than her stated age  She has no lymphadenopathy about the neck or axilla.  Radial artery pulses are normal.  There no skin changes over the hands.  Strength and tone appear normal in the hands.  Deep reflexes are normal.  Dupuytren's contracture is present in the right palm. There is some triggering noted at the DIP joint of the right thumb. She also has some crepitus at the MCP joint of the right hand without associated pain, swelling, or change in color or temperature.        Assessment & Plan:  #1 triggering DIP joint right thumb  #2 possible DeQuervain's tenosynovitis at the MCP joint of the right palm  #3 Dupuytren's contracture  Plan: Hand surgery consultation

## 2012-12-18 NOTE — Patient Instructions (Addendum)
I recommend a Hand Surgery  consultation to determine optimal therapy.  

## 2013-01-03 ENCOUNTER — Other Ambulatory Visit: Payer: Self-pay | Admitting: Internal Medicine

## 2013-01-03 NOTE — Telephone Encounter (Signed)
OK X1 (already answered)

## 2013-01-03 NOTE — Telephone Encounter (Signed)
Pt is requesting refills for Tramadol. Pt is leaving to go out of town tomorrow and would like for this to be refill today. Pt was last seen on 12/18/2012 and the rx last filled on 10/31/2012 #30, 2 refills. Please advise. SW, CMA

## 2013-01-03 NOTE — Telephone Encounter (Signed)
OK X1 

## 2013-02-07 ENCOUNTER — Other Ambulatory Visit: Payer: Self-pay

## 2013-02-12 ENCOUNTER — Other Ambulatory Visit: Payer: Self-pay | Admitting: Cardiology

## 2013-02-15 ENCOUNTER — Telehealth: Payer: Self-pay | Admitting: Internal Medicine

## 2013-02-15 NOTE — Telephone Encounter (Signed)
Patient is calling to inquire about receiving a referral for a specialist to look at the bursitis in her hip. Please advise.

## 2013-02-19 ENCOUNTER — Other Ambulatory Visit: Payer: Self-pay | Admitting: Internal Medicine

## 2013-02-19 DIAGNOSIS — M7071 Other bursitis of hip, right hip: Secondary | ICD-10-CM | POA: Insufficient documentation

## 2013-02-19 NOTE — Telephone Encounter (Signed)
Referral made to Dr Terrilee Files ; he is great

## 2013-02-19 NOTE — Telephone Encounter (Signed)
Please advise if referral is ok 

## 2013-02-21 ENCOUNTER — Encounter: Payer: Self-pay | Admitting: Family Medicine

## 2013-02-21 ENCOUNTER — Ambulatory Visit (INDEPENDENT_AMBULATORY_CARE_PROVIDER_SITE_OTHER): Payer: Medicare Other | Admitting: Family Medicine

## 2013-02-21 VITALS — BP 122/70 | HR 80 | Wt 143.0 lb

## 2013-02-21 DIAGNOSIS — M533 Sacrococcygeal disorders, not elsewhere classified: Secondary | ICD-10-CM

## 2013-02-21 DIAGNOSIS — M76899 Other specified enthesopathies of unspecified lower limb, excluding foot: Secondary | ICD-10-CM

## 2013-02-21 DIAGNOSIS — M7072 Other bursitis of hip, left hip: Secondary | ICD-10-CM

## 2013-02-21 NOTE — Progress Notes (Signed)
  I'm seeing this patient by the request  of:  Marga Melnick, MD   CC: Hip pain, left  HPI: Patient is a very pleasant 72 year old female who has had hip pain. Left side, been about a year. Patient states that it seems to be intermittent. Patient has tried over-the-counter anti-inflammatories without any significant improvement. Patient though was given Celebrex recently and has noticed some improvement. Patient states that this pain though seems to be mostly on the lateral aspect of the hip with some mild radiation to the lateral aspect of the leg. Patient states that it is worse with sitting for long amount of time. Patient states that could also be hurts by palpation. Patient denies any weakness in the leg, any numbness. Patient does have some mild back pain but overall nothing that keeps her from any of her daily activities. Patient states that she is sleeping comfortably. Patient the severity when it was at its worse 8/10 but now with taking the Celebrex it seems to be more at 3/10. Patient's goal is to be able to walk with her grandchildren this Thanksgiving season.   Past medical, surgical, family and social history reviewed. Medications reviewed all in the electronic medical record.   Review of Systems: No headache, visual changes, nausea, vomiting, diarrhea, constipation, dizziness, abdominal pain, skin rash, fevers, chills, night sweats, weight loss, swollen lymph nodes, body aches, joint swelling, muscle aches, chest pain, shortness of breath, mood changes.   Objective:    Blood pressure 122/70, pulse 80, weight 143 lb (64.864 kg), SpO2 97.00%.   General: No apparent distress alert and oriented x3 mood and affect normal, dressed appropriately.  HEENT: Pupils equal, extraocular movements intact Respiratory: Patient's speak in full sentences and does not appear short of breath Cardiovascular: No lower extremity edema, non tender, no erythema Skin: Warm dry intact with no signs of  infection or rash on extremities or on axial skeleton. Abdomen: Soft nontender Neuro: Cranial nerves II through XII are intact, neurovascularly intact in all extremities with 2+ DTRs and 2+ pulses. Lymph: No lymphadenopathy of posterior or anterior cervical chain or axillae bilaterally.  Gait normal with good balance and coordination.  MSK: Non tender with full range of motion and good stability and symmetric strength and tone of shoulders, elbows, wrist, , knee and ankles bilaterally.  ZOX:WRUE hip ROM IR: 35 Deg, ER: 45 Deg, Flexion: 120 Deg, Extension: 100 Deg, Abduction: 45 Deg, Adduction: 45 Deg Strength IR: 5/5, ER: 5/5, Flexion: 5/5, Extension: 5/5, Abduction: 5/5, Adduction: 5/5 Pelvic alignment unremarkable to inspection and palpation. Standing hip rotation and gait without trendelenburg sign / unsteadiness. Greater trochanter with tenderness to palpation. No tenderness over piriformis .Marland Kitchen No pain with FABER or FADIR. Mild SI joint tenderness on left and normal minimal SI movement. Contralateral hip unremarkable    Impression and Recommendations:     This case required medical decision making of moderate complexity.

## 2013-02-21 NOTE — Assessment & Plan Note (Signed)
Discussed diagnosis, treatment options, and prognosis. Patient is already improving just with anti-inflammatories. We did discuss potential injection the patient stated that with her improving she would like to hold off on that for now. Patient given home exercise program today. Discussed icing protocol. Discussed over-the-counter medications that could be beneficial as well and safe with her other medications. Patient will try these interventions and followup on an as-needed basis.

## 2013-02-21 NOTE — Patient Instructions (Signed)
Very nice to meet you Celebrex daily for another 5 days at least then as needed Ice 20 minutes 2 times a day Exercises most days of the week  Sacroiliac Joint Mobilization and Rehab 1. Work on pretzel stretching, shoulder back and leg draped in front. 3-5 sets, 30 sec.. 2. hip abductor rotations. standing, hip flexion and rotation outward then inward. 3 sets, 15 reps. when can do comfortably, add ankle weights starting at 2 pounds.  3. cross over stretching - shoulder back to ground, same side leg crossover. 3-5 sets for 30 min..  4. rolling up and back knees to chest and rocking. 5. sacral tilt - 5 sets, hold for 5-10 seconds  Take tylenol 650 mg three times a day is the best evidence based medicine we have for arthritis.  Glucosamine sulfate 750mg  twice a day is a supplement that has been shown to help moderate to severe arthritis. Vitamin D 1000 IU daily  Fish oil 3 grams daily Tumeric 500mg  twice daily.  Capsaicin topically up to four times a day may also help with pain.  Come back if it gets worse then come back and see me.

## 2013-02-21 NOTE — Progress Notes (Signed)
Pre-visit discussion using our clinic review tool. No additional management support is needed unless otherwise documented below in the visit note.  

## 2013-02-21 NOTE — Assessment & Plan Note (Signed)
Patient does have some mild left SI joint dysfunction. Discussed over-the-counter medications that could be beneficial and given other exercises that could be helpful. We did discuss core strengthening as well. Patient can followup on an as-needed basis for this as well.

## 2013-04-17 ENCOUNTER — Other Ambulatory Visit: Payer: Self-pay | Admitting: Cardiology

## 2013-05-01 ENCOUNTER — Other Ambulatory Visit: Payer: Self-pay | Admitting: *Deleted

## 2013-05-01 MED ORDER — RANITIDINE HCL 150 MG PO TABS: ORAL_TABLET | ORAL | Status: DC

## 2013-05-02 ENCOUNTER — Other Ambulatory Visit: Payer: Self-pay

## 2013-05-02 MED ORDER — ROSUVASTATIN CALCIUM 10 MG PO TABS: ORAL_TABLET | ORAL | Status: DC

## 2013-05-03 LAB — HM MAMMOGRAPHY

## 2013-06-14 ENCOUNTER — Encounter: Payer: Self-pay | Admitting: *Deleted

## 2013-06-18 ENCOUNTER — Other Ambulatory Visit: Payer: Self-pay | Admitting: Cardiology

## 2013-06-24 ENCOUNTER — Ambulatory Visit (INDEPENDENT_AMBULATORY_CARE_PROVIDER_SITE_OTHER): Payer: Medicare Other | Admitting: Cardiology

## 2013-06-24 ENCOUNTER — Encounter: Payer: Self-pay | Admitting: Cardiology

## 2013-06-24 ENCOUNTER — Ambulatory Visit: Payer: Medicare Other | Admitting: Cardiology

## 2013-06-24 VITALS — BP 120/60 | HR 70 | Ht 64.25 in | Wt 146.0 lb

## 2013-06-24 DIAGNOSIS — R0609 Other forms of dyspnea: Secondary | ICD-10-CM

## 2013-06-24 DIAGNOSIS — E782 Mixed hyperlipidemia: Secondary | ICD-10-CM

## 2013-06-24 DIAGNOSIS — I2510 Atherosclerotic heart disease of native coronary artery without angina pectoris: Secondary | ICD-10-CM

## 2013-06-24 DIAGNOSIS — R0989 Other specified symptoms and signs involving the circulatory and respiratory systems: Secondary | ICD-10-CM

## 2013-06-24 NOTE — Patient Instructions (Signed)
Your physician recommends that you return for a FASTING lipid profile /BMET.   Your physician wants you to follow-up in: 1 year with Dr Aundra Dubin. (March 2016). You will receive a reminder letter in the mail two months in advance. If you don't receive a letter, please call our office to schedule the follow-up appointment.

## 2013-06-25 ENCOUNTER — Other Ambulatory Visit (INDEPENDENT_AMBULATORY_CARE_PROVIDER_SITE_OTHER): Payer: Medicare Other

## 2013-06-25 DIAGNOSIS — E782 Mixed hyperlipidemia: Secondary | ICD-10-CM

## 2013-06-25 DIAGNOSIS — I2510 Atherosclerotic heart disease of native coronary artery without angina pectoris: Secondary | ICD-10-CM

## 2013-06-25 LAB — BASIC METABOLIC PANEL
BUN: 16 mg/dL (ref 6–23)
CALCIUM: 9.1 mg/dL (ref 8.4–10.5)
CO2: 26 meq/L (ref 19–32)
CREATININE: 0.9 mg/dL (ref 0.4–1.2)
Chloride: 105 mEq/L (ref 96–112)
GFR: 66.28 mL/min (ref >60.00)
Glucose, Bld: 88 mg/dL (ref 70–99)
Potassium: 3.8 mEq/L (ref 3.5–5.1)
SODIUM: 138 meq/L (ref 135–145)

## 2013-06-25 LAB — LIPID PANEL
CHOL/HDL RATIO: 3
Cholesterol: 142 mg/dL (ref 0–200)
HDL: 55.4 mg/dL (ref >39.00)
LDL CALC: 65 mg/dL (ref 0–99)
Triglycerides: 109 mg/dL (ref 0.0–149.0)
VLDL: 21.8 mg/dL (ref 0.0–40.0)

## 2013-06-26 NOTE — Progress Notes (Signed)
Patient ID: [name], female   DOB: 12/09/1940, 73 y.o.   MRN: [id number] PCP: Dr. Linna Darner  73 yo with history of hyperlipidemia and CAD returns after PCI to RCA and LAD. Patient had noted new dyspnea when walking [place] in early 2012. She was set up for an ETT. This showed below average exercise tolerance and diffuse ST depression in multiple leads. She was set up for LHC, which showed 80% stenosis of the proximal LAD at D2, 80% ostial D2, and subtotal occlusion of the proximal RCA with left to right collaterals. After extensive discussion, we decided on PCI to chronically totally occluded RCA as well as LAD rather than CABG. This was done in 3/12 with 1 Promus DES to the RCA and 1 Promus DES to the LAD.  Given recurrent exertional dyspnea,  I did an ETT-Myoview in 4/13 that showed no ischemia or infarction.  I stopped her beta blocker and she seemed to feel better. She also had a cardiopulmonary exercise test in 3/14.  This was actually basically a normal study, showing no significant cardiac or pulmonary problem.  Recently, she has been stable.  She walks 2 miles twice a week.  She has not been playing as much golf because of a trigger finger on her right hand.  Still occasional dyspnea, will feel like she needs to catch her breath.  Can occur at rest or with exertion.  Overall good exercise tolerance.  No chest pain.   Labs (8/12): LDL 65, HDL 54 Labs (3/13): LDL 62, HDL 60, LFTs normal Labs (3/14): TSH normal, HCT 36, LDL 67, HDL 58  ECG: NSR, normal  Past Medical History:  1. Hyperlipidemia - intolerance to pravastatin and Crestor 10 mg daily (myalgias)  2. Osteopenia 3. Herpes zoster 1997 L flank  4. Diverticulosis, colon  5. CAD: Exertional dyspnea led to ETT in 2/12. This showed significant ST depression in a number of leads. She had LHC (3/12) showing 80% proximal LAD at D2, 80% ostial D2, and subtotal occlusion of a dominant RCA with good L=>R collaterals. EF 60%  with basal inferior hypokinesis. Patient had PCI to RCA with 3 x 20 Promus DES and PCI to LAD with 2.75 x 16 Promus DES with good results.  ETT-Myoview (4/13): EF 69%, no ischemia or infarction.  Echo (3/14): EF 41-66%, mild diastolic dysfunction.  CPX (3/14): VO2 max 19 (95% predicted), VE/VCO2 slope 28, RER 1.1, mild obstruction on PFTs.    Family History:  Father:Esophageal cancer , smoker (died @ 78)  Mother: CHF, diverticulosis, arthritis, skin cancer ,died of CVA @ 70  Siblings: bro: excess tobacco & alcohol; P uncle pancreatic cancer  P Grandmother: CVA (died @ 29), pre-DM, DVT  MGM: cancer , ? stomach; M uncle: MI > 11  1st Cousin: RA  Grandson:?" hole in heart"  Cousin: Thalassemia   Social History:  Never Smoked;  Married  Alcohol use-yes: extremely rarely.   Current Outpatient Prescriptions  Medication Sig Dispense Refill  . aspirin 81 MG tablet Take 81 mg by mouth daily.        . Black Currant Seed Oil 500 MG CAPS Take by mouth daily.       . Calcium Citrate-Vitamin D (CITRUS CALCIUM 1500 + D PO) Take by mouth daily.       . celecoxib (CELEBREX) 200 MG capsule Take 200 mg by mouth daily as needed.       . Coenzyme Q10 (COQ10) 200 MG  CAPS Take by mouth daily.      . Gelatin 600 MG CAPS Take by mouth daily.      . Multiple Vitamin (MULTIVITAMIN) capsule Take 1 capsule by mouth daily.        . nitroGLYCERIN (NITROSTAT) 0.4 MG SL tablet Place 0.4 mg under the tongue every 5 (five) minutes as needed.        . ranitidine (ZANTAC) 150 MG tablet Take 150 mg by mouth daily.      . rosuvastatin (CRESTOR) 10 MG tablet TAKE 1 TABLET BY MOUTH EVERY EVENING AT BEDTIME  90 tablet  0   No current facility-administered medications for this visit.    BP 120/60  Pulse 70  Ht 5' 4.25" (1.632 m)  Wt 66.225 kg (146 lb)  BMI 24.86 kg/m2 General: NAD Neck: No JVD, no thyromegaly or thyroid nodule.  Lungs: Clear to auscultation bilaterally with normal respiratory effort. CV: Nondisplaced  PMI.  Heart regular S1/S2, no S3/S4, no murmur.  No peripheral edema.  No carotid bruit.  Normal pedal pulses.  Abdomen: Soft, nontender, no hepatosplenomegaly, no distention.  Neurologic: Alert and oriented x 3.  Psych: Normal affect. Extremities: No clubbing or cyanosis.   Assessment/Plan: 1. CAD: Last myoview in 4/13 was normal.  Echo in 3/14 was unremarkable.  Doing well overall.  She will continue ASA 81 and Crestor.  She is off metoprolol as it seemed to make her feel fatigued and dyspneic.   2. Dyspnea: CPX done in 3/14 showed no significant cardiac pulmonary limitation. She occasionally feels like she has to "catch her breath."  Stable pattern, overall good exercise tolerance.  3. Hyperlipidemia: Continue Crestor.  Check lipids.   [name] 06/26/2013

## 2013-07-01 ENCOUNTER — Encounter: Payer: Self-pay | Admitting: Internal Medicine

## 2013-07-01 ENCOUNTER — Ambulatory Visit (INDEPENDENT_AMBULATORY_CARE_PROVIDER_SITE_OTHER)
Admission: RE | Admit: 2013-07-01 | Discharge: 2013-07-01 | Disposition: A | Discharge status: Home / Self Care | Payer: Medicare Other | Source: Ambulatory Visit | Attending: Internal Medicine | Admitting: Internal Medicine

## 2013-07-01 ENCOUNTER — Ambulatory Visit (INDEPENDENT_AMBULATORY_CARE_PROVIDER_SITE_OTHER): Payer: Medicare Other | Admitting: Internal Medicine

## 2013-07-01 ENCOUNTER — Other Ambulatory Visit (INDEPENDENT_AMBULATORY_CARE_PROVIDER_SITE_OTHER): Payer: Medicare Other

## 2013-07-01 VITALS — BP 120/58 | HR 70 | Temp 97.4°F | Resp 13 | Ht 64.25 in | Wt 145.8 lb

## 2013-07-01 DIAGNOSIS — R0989 Other specified symptoms and signs involving the circulatory and respiratory systems: Principal | ICD-10-CM

## 2013-07-01 DIAGNOSIS — K219 Gastro-esophageal reflux disease without esophagitis: Secondary | ICD-10-CM

## 2013-07-01 DIAGNOSIS — M76899 Other specified enthesopathies of unspecified lower limb, excluding foot: Secondary | ICD-10-CM

## 2013-07-01 DIAGNOSIS — M7072 Other bursitis of hip, left hip: Secondary | ICD-10-CM

## 2013-07-01 DIAGNOSIS — E782 Mixed hyperlipidemia: Secondary | ICD-10-CM

## 2013-07-01 DIAGNOSIS — R0609 Other forms of dyspnea: Secondary | ICD-10-CM

## 2013-07-01 LAB — AST: AST: 23 U/L (ref 0–37)

## 2013-07-01 LAB — TSH: TSH: 1.52 u[IU]/mL (ref 0.35–5.50)

## 2013-07-01 LAB — ALT: ALT: 13 U/L (ref 0–35)

## 2013-07-01 NOTE — Addendum Note (Signed)
Addended by: Harl Bowie on: 07/01/2013 09:41 AM   Modules accepted: Orders

## 2013-07-01 NOTE — Progress Notes (Signed)
Pre visit review using our clinic review tool, if applicable. No additional management support is needed unless otherwise documented below in the visit note. 

## 2013-07-01 NOTE — Patient Instructions (Signed)
Reflux of gastric acid may be asymptomatic as this may occur mainly during sleep.The triggers for reflux  include stress; the "aspirin family" ; alcohol; peppermint; and caffeine (coffee, tea, cola, and chocolate). The aspirin family would include aspirin and the nonsteroidal agents such as ibuprofen &  Naproxen. Tylenol would not cause reflux. If having symptoms ; food & drink should be avoided for @ least 2 hours before going to bed.  

## 2013-07-01 NOTE — Progress Notes (Signed)
   Subjective:    Patient ID: [name], female    DOB: 02-16-1941, 73 y.o.   MRN: [id number]  HPI  A heart healthy diet is followed; exercise "pathetic" due to weather & work schedule.   Family history is negative for premature coronary disease. LDL goal is less than 70 with CAD . There is medication compliance with the statin.  Low dose ASA taken  Review of Systems Specifically denied are  chest pain, palpitations,  or claudication. Dyspnea is variable over past several years  w/o trigger, occuring even @ rest.No PMH of asthma or smoking. Significant  memory deficit or myalgias not present. She has intermittent early am N&V despite Ranitidine qhs.She denies dyspepsia,melena, or rectal bleeding.She has no unexplained weight loss, dysphagia, or abdominal pain.       Objective:   Physical Exam Gen.: Healthy and well-nourished in appearance. Alert, appropriate and cooperative throughout exam. Appears younger than stated age  Head: Normocephalic without obvious abnormalities  Eyes: No corneal or conjunctival inflammation noted.  Nose: External nasal exam reveals no deformity or inflammation. Nasal mucosa are pink and moist. No lesions or exudates noted.   Mouth: Oral mucosa and oropharynx reveal no lesions or exudates. Teeth in good repair. Neck: No deformities, masses, or tenderness noted. Range of motion & Thyroid normal. Lungs: Normal respiratory effort; chest expands symmetrically. Lungs are clear to auscultation without rales, wheezes, or increased work of breathing. Heart: Normal rate and rhythm. Normal S1 and S2. No gallop, click, or rub.No murmur. Abdomen: Bowel sounds normal; abdomen soft and nontender. No masses, organomegaly or hernias noted. Genitalia: as per Gyn                                  Musculoskeletal/extremities: No deformity or scoliosis noted of  the thoracic or lumbar spine.  No clubbing, cyanosis, edema, or significant extremity  deformity noted. Range of  motion normal .Tone & strength normal. Hand joints  reveal minor  DJD DIP changes.  Fingernail  health good. Able to lie down & sit up w/o help. Negative SLR bilaterally Vascular: Carotid, radial artery, dorsalis pedis and  posterior tibial pulses are full and equal. No bruits present. Neurologic: Alert and oriented x3. Deep tendon reflexes symmetrical and normal.  Gait normal  . Skin: Intact without suspicious lesions or rashes. Lymph: No cervical, axillary lymphadenopathy present. Psych: Mood and affect are normal. Normally interactive                                                                                        Assessment & Plan:  See Current Assessment & Plan in Problem List under specific Diagnosis

## 2013-07-01 NOTE — Assessment & Plan Note (Signed)
As per Dr Charlann Boxer

## 2013-07-01 NOTE — Assessment & Plan Note (Signed)
CXray; PFTs

## 2013-07-01 NOTE — Assessment & Plan Note (Signed)
CBC & dif Ranitidine 150 mg BID

## 2013-07-02 LAB — CBC WITH DIFFERENTIAL/PLATELET
BASOS PCT: 0.2 % (ref 0.0–3.0)
Basophils Absolute: 0 10*3/uL (ref 0.0–0.1)
Eosinophils Absolute: 0.9 10*3/uL — ABNORMAL HIGH (ref 0.0–0.7)
Eosinophils Relative: 14.8 % — ABNORMAL HIGH (ref 0.0–5.0)
HCT: 41.7 % (ref 36.0–46.0)
Hemoglobin: 13.8 g/dL (ref 12.0–15.0)
Lymphocytes Relative: 24.9 % (ref 12.0–46.0)
Lymphs Abs: 1.6 10*3/uL (ref 0.7–4.0)
MCHC: 33.2 g/dL (ref 30.0–36.0)
MCV: 91.2 fl (ref 78.0–100.0)
Monocytes Absolute: 0.4 10*3/uL (ref 0.1–1.0)
Monocytes Relative: 5.7 % (ref 3.0–12.0)
NEUTROS ABS: 3.4 10*3/uL (ref 1.4–7.7)
Neutrophils Relative %: 54.4 % (ref 43.0–77.0)
Platelets: 261 10*3/uL (ref 150.0–400.0)
RBC: 4.57 Mil/uL (ref 3.87–5.11)
RDW: 13.8 % (ref 11.5–14.6)
WBC: 6.3 10*3/uL (ref 4.5–10.5)

## 2013-07-04 ENCOUNTER — Other Ambulatory Visit (INDEPENDENT_AMBULATORY_CARE_PROVIDER_SITE_OTHER): Payer: Medicare Other

## 2013-07-04 ENCOUNTER — Encounter: Payer: Self-pay | Admitting: Family Medicine

## 2013-07-04 ENCOUNTER — Ambulatory Visit (INDEPENDENT_AMBULATORY_CARE_PROVIDER_SITE_OTHER): Payer: Medicare Other | Admitting: Family Medicine

## 2013-07-04 VITALS — BP 130/78 | HR 74

## 2013-07-04 DIAGNOSIS — M25559 Pain in unspecified hip: Secondary | ICD-10-CM

## 2013-07-04 DIAGNOSIS — M7072 Other bursitis of hip, left hip: Secondary | ICD-10-CM

## 2013-07-04 DIAGNOSIS — M76899 Other specified enthesopathies of unspecified lower limb, excluding foot: Secondary | ICD-10-CM

## 2013-07-04 DIAGNOSIS — M25552 Pain in left hip: Secondary | ICD-10-CM

## 2013-07-04 NOTE — Patient Instructions (Signed)
Good to see you as always Continue the exercises 3 times a week.  Ice 20 minutes after activity  Celebrex daily for 5 days  Come back in 3-4 weeks.

## 2013-07-04 NOTE — Progress Notes (Signed)
CC: Hip pain, left  HPI: Patient is a very pleasant 73 year old female who has had hip pain. Patient was seen previously greater than 3 months ago and was diagnosed with greater trochanteric bursitis as well as a sacroiliac joint dysfunction. Patient was given exercises working on mobility of the SI joint and strengthening hip abductor's. Patient had been doing very well but states now she is no longer responding to the anti-inflammatories and states that the exercises she is not doing as regularly. Patient states it hurts more when she walks up or down hill. Some nighttime awakenings.    Past medical, surgical, family and social history reviewed. Medications reviewed all in the electronic medical record.   Review of Systems: No headache, visual changes, nausea, vomiting, diarrhea, constipation, dizziness, abdominal pain, skin rash, fevers, chills, night sweats, weight loss, swollen lymph nodes, body aches, joint swelling, muscle aches, chest pain, shortness of breath, mood changes.   Objective:    Blood pressure 130/78, pulse 74, SpO2 99.00%.   General: No apparent distress alert and oriented x3 mood and affect normal, dressed appropriately.  HEENT: Pupils equal, extraocular movements intact Respiratory: Patient's speak in full sentences and does not appear short of breath Cardiovascular: No lower extremity edema, non tender, no erythema Skin: Warm dry intact with no signs of infection or rash on extremities or on axial skeleton. Abdomen: Soft nontender Neuro: Cranial nerves II through XII are intact, neurovascularly intact in all extremities with 2+ DTRs and 2+ pulses. Lymph: No lymphadenopathy of posterior or anterior cervical chain or axillae bilaterally.  Gait normal with good balance and coordination.  MSK: Non tender with full range of motion and good stability and symmetric strength and tone of shoulders, elbows, wrist, , knee and ankles bilaterally.  GYJ:EHUD hip ROM IR: 35 Deg, ER:  45 Deg, Flexion: 120 Deg, Extension: 100 Deg, Abduction: 45 Deg, Adduction: 45 Deg Strength IR: 5/5, ER: 5/5, Flexion: 5/5, Extension: 5/5, Abduction: 5/5, Adduction: 5/5 Pelvic alignment unremarkable to inspection and palpation. Standing hip rotation and gait without trendelenburg sign / unsteadiness. Greater trochanter with tenderness to palpation. No tenderness over piriformis .Marland Kitchen No pain with FABER or FADIR. Mild SI joint tenderness on left and normal minimal SI movement. Contralateral hip unremarkable    Procedure: Real-time Ultrasound Guided Injection of left  greater trochanteric bursitis secondary to patient's body habitus Device: GE Logiq E  Ultrasound guided injection is preferred based studies that show increased duration, increased effect, greater accuracy, decreased procedural pain, increased response rate, and decreased cost with ultrasound guided versus blind injection.  Verbal informed consent obtained.  Time-out conducted.  Noted no overlying erythema, induration, or other signs of local infection.  Skin prepped in a sterile fashion.  Local anesthesia: Topical Ethyl chloride.  With sterile technique and under real time ultrasound guidance:  Greater trochanteric area was visualized and patient's bursa was noted. A 22-gauge 3 inch needle was inserted and 4 cc of 0.5% Marcaine and 1 cc of Kenalog 40 mg/dL was injected. Pictures taken Completed without difficulty  Pain immediately resolved suggesting accurate placement of the medication.  Advised to call if fevers/chills, erythema, induration, drainage, or persistent bleeding.  Images permanently stored and available for review in the ultrasound unit.  Impression: Technically successful ultrasound guided injection.   Impression and Recommendations:     This case required medical decision making of moderate complexity.

## 2013-07-04 NOTE — Assessment & Plan Note (Addendum)
Injected today, ice 20 minutes daily, celebrex daily for 5 days, HEP given RTC in 4 weeks if still in pain would start pt.  Spent greater than 25 minutes with patient face-to-face and had greater than 50% of counseling including as described above in assessment and plan.

## 2013-07-16 ENCOUNTER — Telehealth: Payer: Self-pay | Admitting: Family Medicine

## 2013-07-16 NOTE — Telephone Encounter (Signed)
Spoke with pt, she states that her hip was still bothering her. Pt states after the injection for the first week her hip felt great until yesterday she started having pain again. Pt would like to come back in tomorrow to see Dr. Tamala Julian. Appt scheduled.

## 2013-07-16 NOTE — Telephone Encounter (Signed)
Patient states that all last night she had severe pain in her hip that lasted through the night. She says that Celebrex did not help with the pain at all. Patient wants to know if such a relapse of pain should be expected after the injection she had on 07/04/13. Wants to know if OV is necessary if her hip feels fine this morning. Please advise.

## 2013-07-17 ENCOUNTER — Encounter: Payer: Self-pay | Admitting: Family Medicine

## 2013-07-17 ENCOUNTER — Ambulatory Visit (INDEPENDENT_AMBULATORY_CARE_PROVIDER_SITE_OTHER)
Admission: RE | Admit: 2013-07-17 | Discharge: 2013-07-17 | Disposition: A | Discharge status: Home / Self Care | Payer: Medicare Other | Source: Ambulatory Visit | Attending: Family Medicine | Admitting: Family Medicine

## 2013-07-17 ENCOUNTER — Ambulatory Visit (INDEPENDENT_AMBULATORY_CARE_PROVIDER_SITE_OTHER): Payer: Medicare Other | Admitting: Family Medicine

## 2013-07-17 VITALS — BP 132/68 | HR 64

## 2013-07-17 DIAGNOSIS — M7072 Other bursitis of hip, left hip: Secondary | ICD-10-CM

## 2013-07-17 DIAGNOSIS — M76899 Other specified enthesopathies of unspecified lower limb, excluding foot: Secondary | ICD-10-CM

## 2013-07-17 DIAGNOSIS — M2420 Disorder of ligament, unspecified site: Secondary | ICD-10-CM

## 2013-07-17 DIAGNOSIS — M629 Disorder of muscle, unspecified: Secondary | ICD-10-CM

## 2013-07-17 DIAGNOSIS — M7630 Iliotibial band syndrome, unspecified leg: Secondary | ICD-10-CM

## 2013-07-17 MED ORDER — PREDNISONE 50 MG PO TABS: 50.0000 mg | ORAL_TABLET | Freq: Every day | ORAL | Status: DC

## 2013-07-17 NOTE — Progress Notes (Signed)
CC: Hip pain, left  HPI: Patient is a very pleasant 73 year old female who has had hip pain.  Patient was diagnosed with bursitis of the left hip. Patient did have an injection at last visit and states that it lasted for 8 days no significant pain at all. 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 Patient states is mostly on the lateral aspect and did radiate down her leg somewhat. Patient states over the last 24 hours he started to dissipate again. Patient denies any significant back pain and denies any numbness. Patient states though it still hurts to sleep on that side. Denies any bowel or bladder incontinence. Denies any true injury.   Past medical, surgical, family and social history reviewed. Medications reviewed all in the electronic medical record.   Review of Systems: No headache, visual changes, nausea, vomiting, diarrhea, constipation, dizziness, abdominal pain, skin rash, fevers, chills, night sweats, weight loss, swollen lymph nodes, body aches, joint swelling, muscle aches, chest pain, shortness of breath, mood changes.   Objective:    Blood pressure 132/68, pulse 64, SpO2 98.00%.   General: No apparent distress alert and oriented x3 mood and affect normal, dressed appropriately.  HEENT: Pupils equal, extraocular movements intact Respiratory: Patient's speak in full sentences and does not appear short of breath Cardiovascular: No lower extremity edema, non tender, no erythema Skin: Warm dry intact with no signs of infection or rash on extremities or on axial skeleton. Abdomen: Soft nontender Neuro: Cranial nerves II through XII are intact, neurovascularly intact in all extremities with 2+ DTRs and 2+ pulses. Lymph: No lymphadenopathy of posterior or anterior cervical chain or axillae bilaterally.  Gait normal with good balance and coordination.  MSK: Non tender with full range of motion and good  stability and symmetric strength and tone of shoulders, elbows, wrist, , knee and ankles bilaterally.  GGE:ZMOQ hip ROM IR: 35 Deg, ER: 45 Deg, Flexion: 120 Deg, Extension: 100 Deg, Abduction: 45 Deg, Adduction: 45 Deg Strength IR: 5/5, ER: 5/5, Flexion: 5/5, Extension: 5/5, Abduction: 5/5, Adduction: 5/5 Pelvic alignment unremarkable to inspection and palpation. Standing hip rotation and gait without trendelenburg sign / unsteadiness. Greater trochanter with tenderness to palpation but actually significantly better than prior exam No tenderness over piriformis .Marland Kitchen No pain with FABER or FADIR. Mild SI joint tenderness on left and normal minimal SI movement. Contralateral hip unremarkable       Impression and Recommendations:     This case required medical decision making of moderate complexity.

## 2013-07-17 NOTE — Assessment & Plan Note (Signed)
Patient is improving slowly. Patient has been taking the Celebrex and was not make any significant improvement. We would do a dose of prednisone and patient given a prescription for this today but try to wait for another 24 hours to see if she continues to improve. We'll also get patient to go to formal physical therapy where he can do it more in a concentrated area to see if the home exercises can be beneficial and see if they can get some more strengthening and help with patient's muscle imbalances. Patient will try these interventions and come back and see me in 3-4 weeks for further evaluation.

## 2013-07-17 NOTE — Patient Instructions (Signed)
Good to see you  Continue ice 20 minutes 2 times a day Hold on exercises for next 48 hours then only 3 times a week Physical therapy will be calling you.  Prednisone daily for 5 days starting tomorrow.  See me again in 3 weeks.

## 2013-07-25 ENCOUNTER — Ambulatory Visit: Payer: Medicare Other | Attending: Family Medicine | Admitting: Physical Therapy

## 2013-07-25 DIAGNOSIS — M25559 Pain in unspecified hip: Secondary | ICD-10-CM | POA: Insufficient documentation

## 2013-07-25 DIAGNOSIS — IMO0001 Reserved for inherently not codable concepts without codable children: Secondary | ICD-10-CM | POA: Insufficient documentation

## 2013-07-30 ENCOUNTER — Ambulatory Visit (INDEPENDENT_AMBULATORY_CARE_PROVIDER_SITE_OTHER): Payer: Medicare Other | Admitting: Internal Medicine

## 2013-07-30 ENCOUNTER — Encounter: Payer: Self-pay | Admitting: Internal Medicine

## 2013-07-30 ENCOUNTER — Other Ambulatory Visit: Payer: Medicare Other

## 2013-07-30 VITALS — BP 120/80 | HR 68 | Temp 96.8°F | Resp 14 | Wt 145.0 lb

## 2013-07-30 DIAGNOSIS — R319 Hematuria, unspecified: Secondary | ICD-10-CM

## 2013-07-30 DIAGNOSIS — R300 Dysuria: Secondary | ICD-10-CM

## 2013-07-30 LAB — POCT URINALYSIS DIPSTICK
Bilirubin, UA: NEGATIVE
Glucose, UA: NEGATIVE
KETONES UA: NEGATIVE
Nitrite, UA: POSITIVE
PROTEIN UA: NEGATIVE
SPEC GRAV UA: 1.01
Urobilinogen, UA: 0.2
pH, UA: 6

## 2013-07-30 MED ORDER — NITROFURANTOIN MONOHYD MACRO 100 MG PO CAPS: 100.0000 mg | ORAL_CAPSULE | Freq: Two times a day (BID) | ORAL | Status: DC

## 2013-07-30 NOTE — Progress Notes (Signed)
   Subjective:    Patient ID: [name], female    DOB: September 13, 1940, 73 y.o.   MRN: [id number] HPI  She describes dysuria and difficulty starting a stream of urine x 3 days.  Her last UTI was 06/07/12.  Of note, she had cortisone injection for hip bursitis on 07/04/13.    Her UA today revealed +leukocytes, +nitrates, small Hgb.  Review of Systems Denies fever, chills, sweats, flank pain, blood or pus in urine, nocturia.     Objective:   Physical Exam General appearance:good health ;well nourished; no acute distress or increased work of breathing is present.  No  lymphadenopathy about the head, neck, or axilla noted.   Neck:  No deformities, masses, or tenderness noted.  Supple with full range of motion without pain.   Heart:  Normal rate and regular rhythm. S1 and S2 normal without gallop, murmur, click, rub or other extra sounds.   Lungs:Chest clear to auscultation; no wheezes, rhonchi,rales ,or rubs present. No increased work of breathing.    Extremities:  No cyanosis, edema, or clubbing noted.   No carotid, renal or aortic bruits auscultated.   Skin: Warm & dry w/o jaundice or tenting.     Assessment & Plan:  #1 UTI - nitrofurantoin 100 mg BID x 5 days (creatinine 0.9; GFR 66.28 on 06/25/13)

## 2013-07-30 NOTE — Progress Notes (Signed)
Pre visit review using our clinic review tool, if applicable. No additional management support is needed unless otherwise documented below in the visit note. 

## 2013-07-30 NOTE — Patient Instructions (Signed)
Drink as much nondairy fluids as possible. Avoid spicy foods or alcohol as  these may aggravate the bladder. Do not take decongestants. Avoid narcotics if possible. 

## 2013-07-30 NOTE — Progress Notes (Signed)
   Subjective:    Patient ID: [name], female    DOB: [birth year], 73 y.o.   MRN: [id number]  HPI  She describes dysuria and difficulty starting a stream of urine x 3 days.  Of note, she had cortisone injection for hip bursitis on 07/04/13. She denies DM. Her last UTI was 2-3 years ago.  Her UA today revealed +leukocytes, +nitrates, small Hgb.  Review of Systems Denies fever, chills, sweats, flank pain, blood or pus in urine, nocturia.      Objective:   Physical Exam General appearance is one of good health and nourishment w/o distress.  Eyes: No conjunctival inflammation or scleral icterus is present.  Oral exam: Dental hygiene is good; lips and gums are healthy appearing.There is no oropharyngeal erythema or exudate noted.   Heart:  Normal rate and regular rhythm. S1 and S2 normal without gallop, murmur, click, rub or other extra sounds     Lungs:Chest clear to auscultation; no wheezes, rhonchi,rales ,or rubs present.No increased work of breathing.   Abdomen: bowel sounds normal, soft and non-tender without masses, organomegaly or hernias noted.  No guarding or rebound . No tenderness over the flanks to percussion  Musculoskeletal: Able to lie flat and sit up without help. Negative straight leg raising bilaterally.  Skin:Warm & dry.  Intact without suspicious lesions or rashes ; no jaundice or tenting  Lymphatic: No lymphadenopathy is noted about the head, neck, axilla.              Assessment & Plan:  #1 UTI - nitrofurantoin 100 mg BID x 7 days  (creatinine 0.9; GFR 66.28 on 06/25/13)

## 2013-08-01 LAB — URINE CULTURE: Colony Count: >=100000

## 2013-08-02 ENCOUNTER — Ambulatory Visit: Payer: Medicare Other | Attending: Family Medicine | Admitting: Physical Therapy

## 2013-08-02 DIAGNOSIS — M949 Disorder of cartilage, unspecified: Secondary | ICD-10-CM

## 2013-08-02 DIAGNOSIS — M25559 Pain in unspecified hip: Secondary | ICD-10-CM | POA: Diagnosis not present

## 2013-08-02 DIAGNOSIS — IMO0001 Reserved for inherently not codable concepts without codable children: Secondary | ICD-10-CM | POA: Insufficient documentation

## 2013-08-02 DIAGNOSIS — Z9861 Coronary angioplasty status: Secondary | ICD-10-CM | POA: Insufficient documentation

## 2013-08-02 DIAGNOSIS — M899 Disorder of bone, unspecified: Secondary | ICD-10-CM | POA: Insufficient documentation

## 2013-08-09 ENCOUNTER — Ambulatory Visit: Payer: Medicare Other | Admitting: Physical Therapy

## 2013-08-09 ENCOUNTER — Encounter: Payer: Self-pay | Admitting: Internal Medicine

## 2013-08-09 ENCOUNTER — Other Ambulatory Visit: Payer: Medicare Other

## 2013-08-09 ENCOUNTER — Ambulatory Visit (INDEPENDENT_AMBULATORY_CARE_PROVIDER_SITE_OTHER): Payer: Medicare Other | Admitting: Internal Medicine

## 2013-08-09 VITALS — BP 130/74 | HR 77 | Temp 98.2°F | Resp 13 | Wt 145.6 lb

## 2013-08-09 DIAGNOSIS — R300 Dysuria: Secondary | ICD-10-CM

## 2013-08-09 DIAGNOSIS — R319 Hematuria, unspecified: Secondary | ICD-10-CM

## 2013-08-09 DIAGNOSIS — IMO0001 Reserved for inherently not codable concepts without codable children: Secondary | ICD-10-CM | POA: Diagnosis not present

## 2013-08-09 LAB — POCT URINALYSIS DIPSTICK
BILIRUBIN UA: NEGATIVE
GLUCOSE UA: NEGATIVE
KETONES UA: NEGATIVE
Nitrite, UA: POSITIVE
Spec Grav, UA: 1.01
Urobilinogen, UA: 0.2
pH, UA: 7.5

## 2013-08-09 MED ORDER — CIPROFLOXACIN HCL 500 MG PO TABS: 500.0000 mg | ORAL_TABLET | Freq: Two times a day (BID) | ORAL | Status: DC

## 2013-08-09 MED ORDER — PHENAZOPYRIDINE HCL 200 MG PO TABS: 200.0000 mg | ORAL_TABLET | Freq: Three times a day (TID) | ORAL | Status: DC | PRN

## 2013-08-09 NOTE — Progress Notes (Signed)
   Subjective:    Patient ID: [name], female    DOB: Apr 19, 1940, 73 y.o.   MRN: [id number]  HPI  Symptoms began last night at 8 PM as hesitancy and difficulty urinating associated with dysuria.  She had completed a week course of Macrobid 100 mg twice a day as of 5/4 for Escherichia coli urinary tract infection  She has no past history of genitourinary anomaly or instrumentation  She does have a history of bilateral ovarian cyst for which she sees Dr. Radene Knee.   Review of Systems   She denies urgency, frequency, hematuria, or pyuria.  She also has no flank pain, fever, chills, or sweats. She did state that she felt feverish and her eyes felt warm but there was no documented elevation of temperature        Objective:   Physical Exam General appearance is one of good health and nourishment w/o distress.  Eyes: No conjunctival inflammation or scleral icterus is present.   Heart:  Normal rate and regular rhythm. S1 and S2 normal without gallop, murmur, click, rub or other extra sounds     Lungs:Chest clear to auscultation; no wheezes, rhonchi,rales ,or rubs present.No increased work of breathing.   Abdomen: bowel sounds normal, soft and non-tender without masses, organomegaly or hernias noted.  No guarding or rebound . No tenderness over the flanks to percussion  Musculoskeletal: Able to lie flat and sit up without help. Negative straight leg raising bilaterally. Gait normal  Skin:Warm & dry.  Intact without suspicious lesions or rashes ; no jaundice . Slight  tenting  Lymphatic: No lymphadenopathy is noted about the head, neck, axilla.                Assessment & Plan:  #1 hesitancy  #2 dysuria  #3 recent urinary tract infection treated with 7 days of nitrofurantoin  See orders. We discussed a urologic referral if she has documented recurrent urinary tract infections. She'll discuss with Dr. Radene Knee whether there is any component of vaginal drying as a  factor in the recurrent urinary tract symptoms

## 2013-08-09 NOTE — Progress Notes (Signed)
Pre visit review using our clinic review tool, if applicable. No additional management support is needed unless otherwise documented below in the visit note. 

## 2013-08-09 NOTE — Patient Instructions (Signed)
Drink as much nondairy fluids as possible. Avoid spicy foods or alcohol as  these may aggravate the bladder. Do not take decongestants. Avoid narcotics if possible. 

## 2013-08-14 ENCOUNTER — Other Ambulatory Visit: Payer: Self-pay | Admitting: Internal Medicine

## 2013-08-16 ENCOUNTER — Ambulatory Visit: Payer: Medicare Other | Admitting: Physical Therapy

## 2013-08-16 LAB — URINE CULTURE
Colony Count: NO GROWTH
Organism ID, Bacteria: NO GROWTH

## 2013-08-23 ENCOUNTER — Ambulatory Visit: Payer: Medicare Other | Admitting: Physical Therapy

## 2013-08-23 DIAGNOSIS — IMO0001 Reserved for inherently not codable concepts without codable children: Secondary | ICD-10-CM | POA: Diagnosis not present

## 2013-08-30 ENCOUNTER — Ambulatory Visit: Payer: Medicare Other | Admitting: Physical Therapy

## 2013-08-30 DIAGNOSIS — IMO0001 Reserved for inherently not codable concepts without codable children: Secondary | ICD-10-CM | POA: Diagnosis not present

## 2013-09-06 ENCOUNTER — Ambulatory Visit: Payer: Medicare Other | Admitting: Physical Therapy

## 2013-09-13 ENCOUNTER — Encounter: Payer: Medicare Other | Admitting: Physical Therapy

## 2013-09-17 ENCOUNTER — Other Ambulatory Visit: Payer: Self-pay | Admitting: Cardiology

## 2013-09-19 ENCOUNTER — Other Ambulatory Visit: Payer: Self-pay | Admitting: Cardiology

## 2013-09-20 ENCOUNTER — Ambulatory Visit: Payer: Medicare Other | Admitting: Physical Therapy

## 2013-11-29 ENCOUNTER — Other Ambulatory Visit: Payer: Self-pay | Admitting: Internal Medicine

## 2013-12-02 ENCOUNTER — Other Ambulatory Visit: Payer: Self-pay | Admitting: Internal Medicine

## 2013-12-02 DIAGNOSIS — M25552 Pain in left hip: Secondary | ICD-10-CM

## 2013-12-10 ENCOUNTER — Ambulatory Visit
Admission: RE | Admit: 2013-12-10 | Discharge: 2013-12-10 | Disposition: A | Discharge status: Home / Self Care | Payer: Medicare Other | Source: Ambulatory Visit | Attending: Internal Medicine | Admitting: Internal Medicine

## 2013-12-10 DIAGNOSIS — M25552 Pain in left hip: Secondary | ICD-10-CM

## 2013-12-17 ENCOUNTER — Other Ambulatory Visit: Payer: Self-pay | Admitting: Cardiology

## 2014-01-14 ENCOUNTER — Encounter (HOSPITAL_BASED_OUTPATIENT_CLINIC_OR_DEPARTMENT_OTHER): Admission: RE | Payer: Self-pay | Source: Ambulatory Visit

## 2014-01-14 ENCOUNTER — Ambulatory Visit (HOSPITAL_BASED_OUTPATIENT_CLINIC_OR_DEPARTMENT_OTHER)
Admission: RE | Admit: 2014-01-14 | Payer: Medicare Other | Source: Ambulatory Visit | Admitting: Obstetrics and Gynecology

## 2014-01-14 SURGERY — SALPINGO-OOPHORECTOMY, BILATERAL, LAPAROSCOPIC
Anesthesia: Choice | Laterality: Bilateral

## 2014-02-02 HISTORY — PX: CATARACT EXTRACTION W/ INTRAOCULAR LENS IMPLANT: SHX1309

## 2014-03-14 ENCOUNTER — Encounter: Payer: Self-pay | Admitting: Physician Assistant

## 2014-03-14 ENCOUNTER — Encounter: Payer: Self-pay | Admitting: Cardiology

## 2014-04-09 ENCOUNTER — Other Ambulatory Visit: Payer: Self-pay

## 2014-04-09 MED ORDER — RANITIDINE HCL 150 MG PO TABS: 150.0000 mg | ORAL_TABLET | Freq: Every day | ORAL | Status: DC

## 2014-06-11 ENCOUNTER — Other Ambulatory Visit: Payer: Self-pay | Admitting: Cardiology

## 2014-07-03 ENCOUNTER — Encounter: Payer: Self-pay | Admitting: Internal Medicine

## 2014-07-03 ENCOUNTER — Other Ambulatory Visit (INDEPENDENT_AMBULATORY_CARE_PROVIDER_SITE_OTHER): Payer: PPO

## 2014-07-03 ENCOUNTER — Ambulatory Visit (INDEPENDENT_AMBULATORY_CARE_PROVIDER_SITE_OTHER): Payer: PPO | Admitting: Internal Medicine

## 2014-07-03 VITALS — BP 128/70 | HR 60 | Temp 98.0°F | Resp 15 | Ht 64.0 in | Wt 140.5 lb

## 2014-07-03 DIAGNOSIS — K219 Gastro-esophageal reflux disease without esophagitis: Secondary | ICD-10-CM

## 2014-07-03 DIAGNOSIS — I2510 Atherosclerotic heart disease of native coronary artery without angina pectoris: Secondary | ICD-10-CM

## 2014-07-03 DIAGNOSIS — E782 Mixed hyperlipidemia: Secondary | ICD-10-CM

## 2014-07-03 DIAGNOSIS — K5730 Diverticulosis of large intestine without perforation or abscess without bleeding: Secondary | ICD-10-CM

## 2014-07-03 DIAGNOSIS — M8580 Other specified disorders of bone density and structure, unspecified site: Secondary | ICD-10-CM | POA: Diagnosis not present

## 2014-07-03 DIAGNOSIS — M5416 Radiculopathy, lumbar region: Secondary | ICD-10-CM

## 2014-07-03 LAB — LIPID PANEL
CHOL/HDL RATIO: 3
Cholesterol: 146 mg/dL (ref 0–200)
HDL: 56 mg/dL (ref >39.00)
LDL Cholesterol: 69 mg/dL (ref 0–99)
NONHDL: 90
TRIGLYCERIDES: 104 mg/dL (ref 0.0–149.0)
VLDL: 20.8 mg/dL (ref 0.0–40.0)

## 2014-07-03 LAB — BASIC METABOLIC PANEL
BUN: 18 mg/dL (ref 6–23)
CO2: 25 mEq/L (ref 19–32)
Calcium: 9.4 mg/dL (ref 8.4–10.5)
Chloride: 106 mEq/L (ref 96–112)
Creatinine, Ser: 0.88 mg/dL (ref 0.40–1.20)
GFR: 66.96 mL/min (ref >60.00)
Glucose, Bld: 92 mg/dL (ref 70–99)
Potassium: 4 mEq/L (ref 3.5–5.1)
SODIUM: 139 meq/L (ref 135–145)

## 2014-07-03 LAB — HEPATIC FUNCTION PANEL
ALT: 8 U/L (ref 0–35)
AST: 20 U/L (ref 0–37)
Albumin: 4.5 g/dL (ref 3.5–5.2)
Alkaline Phosphatase: 55 U/L (ref 39–117)
BILIRUBIN TOTAL: 0.5 mg/dL (ref 0.2–1.2)
Bilirubin, Direct: 0.1 mg/dL (ref 0.0–0.3)
TOTAL PROTEIN: 7.2 g/dL (ref 6.0–8.3)

## 2014-07-03 LAB — CBC WITH DIFFERENTIAL/PLATELET
BASOS PCT: 0.2 % (ref 0.0–3.0)
Basophils Absolute: 0 10*3/uL (ref 0.0–0.1)
Eosinophils Absolute: 0.1 10*3/uL (ref 0.0–0.7)
Eosinophils Relative: 1.8 % (ref 0.0–5.0)
HCT: 37.8 % (ref 36.0–46.0)
Hemoglobin: 13 g/dL (ref 12.0–15.0)
Lymphocytes Relative: 23.8 % (ref 12.0–46.0)
Lymphs Abs: 1.2 10*3/uL (ref 0.7–4.0)
MCHC: 34.4 g/dL (ref 30.0–36.0)
MCV: 88.5 fl (ref 78.0–100.0)
MONO ABS: 0.4 10*3/uL (ref 0.1–1.0)
Monocytes Relative: 8.8 % (ref 3.0–12.0)
NEUTROS ABS: 3.3 10*3/uL (ref 1.4–7.7)
NEUTROS PCT: 65.4 % (ref 43.0–77.0)
PLATELETS: 231 10*3/uL (ref 150.0–400.0)
RBC: 4.27 Mil/uL (ref 3.87–5.11)
RDW: 13.8 % (ref 11.5–15.5)
WBC: 5 10*3/uL (ref 4.0–10.5)

## 2014-07-03 LAB — TSH: TSH: 1.3 u[IU]/mL (ref 0.35–4.50)

## 2014-07-03 LAB — CK: Total CK: 72 U/L (ref 7–177)

## 2014-07-03 NOTE — Assessment & Plan Note (Signed)
BMD & vit D level as per Dr Annita Brod

## 2014-07-03 NOTE — Patient Instructions (Signed)
Reflux of gastric acid may be asymptomatic as this may occur mainly during sleep.The triggers for reflux  include stress; the "aspirin family" ; alcohol; peppermint; and caffeine (coffee, tea, cola, and chocolate). The aspirin family would include aspirin and the nonsteroidal agents such as ibuprofen &  Naproxen. Tylenol would not cause reflux. If having symptoms ; food & drink should be avoided for @ least 2 hours before going to bed.    Your next office appointment will be determined based upon review of your pending labs  Those instructions will be transmitted to you by My Chart  Critical results will be called..  The best exercises for the low back include freestyle swimming, stretch aerobics, and yoga.Cybex & Nautilus machines rather than dead weights are better for the back.

## 2014-07-03 NOTE — Progress Notes (Signed)
Pre visit review using our clinic review tool, if applicable. No additional management support is needed unless otherwise documented below in the visit note. 

## 2014-07-03 NOTE — Assessment & Plan Note (Signed)
CBC & dif  Anti reflux measures 

## 2014-07-03 NOTE — Assessment & Plan Note (Signed)
CBC & dif 

## 2014-07-03 NOTE — Progress Notes (Signed)
Subjective:    Patient ID: [name], female    DOB: [birth year], 74 y.o.   MRN: [id number]  HPI The patient is here to assess status of active health conditions.  PMH, FH, & Social History reviewed & updated.  She has been unable to exercise because of a chronic left L1-L2 lumbar radiculopathy due to scoliosis documented on MRI. She has been evaluated by multiple physicians; she's now seeing  Dr Lanette Hampshire. Steroid injections on 3 occasions were of no benefit.  She is on a heart healthy diet and has been compliant with her medications without adverse effect. She does not have the significant exertional dyspnea/fatigue which was the presenting symptom for her coronary artery disease.  Other than minor exertional dyspnea; she has no cardiopulmonary symptoms.   Colonoscopy is due next year. She has no active GI symptoms. She's been taking the ranitidine only at night as she was worried about potential adverse effects.    Her Ophthalmologist is monitoring a "drop of blood " on her optic nerve. She has no other bleeding dyscrasias.  Review of Systems   Chest pain, palpitations, tachycardia,  paroxysmal nocturnal dyspnea, claudication or edema are absent.  Unexplained weight loss, abdominal pain, significant dyspepsia, dysphagia, melena, rectal bleeding, or persistently small caliber stools are denied.  Epistaxis, hemoptysis, or hematuria denied. There is no abnormal bruising , bleeding, or difficulty stopping bleeding with injury.     Objective:   Physical Exam  Gen.: Adequately nourished in appearance. Alert, appropriate and cooperative throughout exam.  Appears younger than stated age  Head: Normocephalic without obvious abnormalities  Eyes: No corneal or conjunctival inflammation noted. Pupils equal round reactive to light and accommodation. Extraocular motion intact. Ptosis bilaterally Ears: External  ear exam reveals no significant lesions or deformities. Canals  clear .TMs normal. Hearing is grossly normal bilaterally. Nose: External nasal exam reveals no deformity or inflammation. Nasal mucosa are pink and moist. No lesions or exudates noted.   Mouth: Oral mucosa and oropharynx reveal no lesions or exudates. Teeth in good repair. Neck: No deformities, masses, or tenderness noted. Range of motion & Thyroid normal Lungs: Normal respiratory effort; chest expands symmetrically. Lungs are clear to auscultation without rales, wheezes, or increased work of breathing. Heart: Normal rate and rhythm. Normal S1 and S2. No gallop, click, or rub. No murmur. Abdomen: Bowel sounds normal; abdomen soft and nontender. No masses, organomegaly or hernias noted. Genitalia: as per Gyn                                  Musculoskeletal/extremities: There is some asymmetry of the inferior thoracolumbar musculature suggesting occult scoliosis. No clubbing, cyanosis, edema, or significant extremity  deformity noted.  Range of motion normal . Tone & strength normal. Hand joints normal.  Fingernail  health good. Crepitus of knees  Able to lie down & sit up w/o help.  Negative SLR bilaterally Vascular: Carotid, radial artery, dorsalis pedis and  posterior tibial pulses are full and equal. No bruits present. Neurologic: Alert and oriented x3. Deep tendon reflexes symmetrical and normal.  Gait normal    Skin: Intact without suspicious lesions or rashes. Lymph: No cervical, axillary lymphadenopathy present. Psych: Mood and affect are normal. Normally interactive  Assessment & Plan:  See Current Assessment & Plan in Problem List under specific Diagnosis

## 2014-07-04 NOTE — Assessment & Plan Note (Signed)
As per Dr. Botero, NS 

## 2014-07-04 NOTE — Assessment & Plan Note (Signed)
Lipids, LFTs, TSH  

## 2014-07-04 NOTE — Assessment & Plan Note (Signed)
Lipid panel 

## 2014-09-17 ENCOUNTER — Other Ambulatory Visit: Payer: Self-pay | Admitting: Cardiology

## 2014-09-29 ENCOUNTER — Other Ambulatory Visit: Payer: Self-pay

## 2014-10-09 ENCOUNTER — Ambulatory Visit (INDEPENDENT_AMBULATORY_CARE_PROVIDER_SITE_OTHER): Payer: PPO | Admitting: Cardiology

## 2014-10-09 ENCOUNTER — Encounter: Payer: Self-pay | Admitting: Cardiology

## 2014-10-09 VITALS — BP 118/70 | HR 80 | Ht 64.0 in | Wt 140.0 lb

## 2014-10-09 DIAGNOSIS — I2510 Atherosclerotic heart disease of native coronary artery without angina pectoris: Secondary | ICD-10-CM | POA: Diagnosis not present

## 2014-10-09 DIAGNOSIS — E782 Mixed hyperlipidemia: Secondary | ICD-10-CM | POA: Diagnosis not present

## 2014-10-09 DIAGNOSIS — M533 Sacrococcygeal disorders, not elsewhere classified: Secondary | ICD-10-CM | POA: Diagnosis not present

## 2014-10-09 NOTE — Patient Instructions (Signed)
Medication Instructions:    Labwork:   Testing/Procedures:   Follow-Up:.  Your physician wants you to follow-up in:  [place] will receive a reminder letter in the mail two months in advance. If you don't receive a letter, please call our office to schedule the follow-up appointment.'     Any Other Special Instructions Will Be Listed Below (If Applicable).

## 2014-10-10 NOTE — Progress Notes (Signed)
Patient ID: [name], female   DOB: [birth year], 74 y.o.   MRN: [id number] PCP: Dr. Linna Darner  74 yo with history of hyperlipidemia and CAD returns after PCI to RCA and LAD. Patient had noted new dyspnea when [place] in early 2012. She was set up for an ETT. This showed below average exercise tolerance and diffuse ST depression in multiple leads. She was set up for LHC, which showed 80% stenosis of the proximal LAD at D2, 80% ostial D2, and subtotal occlusion of the proximal RCA with left to right collaterals. After extensive discussion, we decided on PCI to chronically totally occluded RCA as well as LAD rather than CABG. This was done in 3/12 with 1 Promus DES to the RCA and 1 Promus DES to the LAD.  Given recurrent exertional dyspnea,  I did an ETT-Myoview in 4/13 that showed no ischemia or infarction.  I stopped her beta blocker and she seemed to feel better. She also had a cardiopulmonary exercise test in 3/14.  This was actually basically a normal study, showing no significant cardiac or pulmonary problem.  Main complaint today is sciatica.  This has been very bothersome.  She has also been found to have scoliosis and may have back surgery in the near future.  She is no longer having any significant exertional dyspnea.  She has slight chest pain at rest that will occur 1-2 times a month and can last for up to 30 minutes.  No exertional chest pain.  She has not been as active because of the sciatica, not playing golf.  She can still do ok with stairs, no problems walking up. She can walk about 2 miles then has to stop because of back pain.  Labs (8/12): LDL 65, HDL 54 Labs (3/13): LDL 62, HDL 60, LFTs normal Labs (3/14): TSH normal, HCT 36, LDL 67, HDL 58  ECG: NSR, small inferior Qs  Past Medical History:  1. Hyperlipidemia - intolerance to pravastatin and Crestor 10 mg daily (myalgias)  2. Osteopenia 3. Herpes zoster 1997 L flank  4. Diverticulosis, colon  5. CAD:  Exertional dyspnea led to ETT in 2/12. This showed significant ST depression in a number of leads. She had LHC (3/12) showing 80% proximal LAD at D2, 80% ostial D2, and subtotal occlusion of a dominant RCA with good L=>R collaterals. EF 60% with basal inferior hypokinesis. Patient had PCI to RCA with 3 x 20 Promus DES and PCI to LAD with 2.75 x 16 Promus DES with good results.  ETT-Myoview (4/13): EF 69%, no ischemia or infarction.  Echo (3/14): EF 10-27%, mild diastolic dysfunction.  CPX (3/14): VO2 max 19 (95% predicted), VE/VCO2 slope 28, RER 1.1, mild obstruction on PFTs.  6. Sciatica   Family History:  Father:Esophageal cancer , smoker (died @ 60)  Mother: CHF, diverticulosis, arthritis, skin cancer ,died of CVA @ 76  Siblings: bro: excess tobacco & alcohol; P uncle pancreatic cancer  P Grandmother: CVA (died @ 15), pre-DM, DVT  MGM: cancer , ? stomach; M uncle: MI > 65  1st Cousin: RA  Grandson:?" hole in heart"  Cousin: Thalassemia   Social History:  Never Smoked;  Married  Alcohol use-yes: extremely rarely.   ROS: All systems reviewed and negative except as per HPI.   Current Outpatient Prescriptions  Medication Sig Dispense Refill  . aspirin 81 MG tablet Take 81 mg by mouth daily.      . Black Currant Seed Oil  500 MG CAPS Take by mouth daily.     . Calcium Citrate-Vitamin D (CITRUS CALCIUM 1500 + D PO) Take by mouth daily.     . Coenzyme Q10 (COQ10) 200 MG CAPS Take by mouth daily.    . CRESTOR 10 MG tablet TAKE 1 TABLET BY MOUTH AT BEDTIME 30 tablet 0  . Gelatin 600 MG CAPS Take by mouth daily.    Marland Kitchen HYDROcodone-acetaminophen (NORCO/VICODIN) 5-325 MG per tablet Take 1 tablet by mouth every 6 (six) hours as needed for moderate pain.    . Multiple Vitamin (MULTIVITAMIN) capsule Take 1 capsule by mouth daily.      . nitroGLYCERIN (NITROSTAT) 0.4 MG SL tablet Place 0.4 mg under the tongue every 5 (five) minutes as needed.      . ranitidine (ZANTAC) 150 MG tablet Take 1 tablet (150  mg total) by mouth daily. 90 tablet 1   No current facility-administered medications for this visit.    BP 118/70 mmHg  Pulse 80  Ht 5\' 4"  (1.626 m)  Wt 140 lb (63.504 kg)  BMI 24.02 kg/m2 General: NAD Neck: No JVD, no thyromegaly or thyroid nodule.  Lungs: Clear to auscultation bilaterally with normal respiratory effort. CV: Nondisplaced PMI.  Heart regular S1/S2, no S3/S4, no murmur.  No peripheral edema.  No carotid bruit.  Normal pedal pulses.  Abdomen: Soft, nontender, no hepatosplenomegaly, no distention.  Neurologic: Alert and oriented x 3.  Psych: Normal affect. Extremities: No clubbing or cyanosis.   Assessment/Plan: 1. CAD: Last myoview in 4/13 was normal.  Echo in 3/14 was unremarkable.  Doing well from a cardiac standpoint.  She will continue ASA 81 and Crestor.  She is off metoprolol as it seemed to make her feel fatigued and dyspneic.   2. Dyspnea: CPX done in 3/14 showed no significant cardiac or pulmonary limitation. The dyspnea that she had in the past seems to have resolved.  3. Hyperlipidemia: Continue Crestor.  Lipids good in 3/16.  4. Sciatica: Patient may need back surgery.  I think that she is stable from a cardiac standpoint  to go ahead with this if needed.    [name] 10/10/2014

## 2014-10-15 ENCOUNTER — Other Ambulatory Visit: Payer: Self-pay | Admitting: Cardiology

## 2014-10-17 ENCOUNTER — Other Ambulatory Visit: Payer: Self-pay | Admitting: Internal Medicine

## 2014-11-03 HISTORY — PX: LUMBAR DISC SURGERY: SHX700

## 2014-11-27 ENCOUNTER — Other Ambulatory Visit: Payer: Self-pay | Admitting: Internal Medicine

## 2014-11-28 ENCOUNTER — Other Ambulatory Visit: Payer: Self-pay

## 2014-11-28 MED ORDER — RANITIDINE HCL 150 MG PO TABS: ORAL_TABLET | ORAL | Status: DC

## 2015-05-01 ENCOUNTER — Telehealth: Payer: Self-pay | Admitting: Internal Medicine

## 2015-05-01 NOTE — Telephone Encounter (Signed)
Sorry but I am too full  

## 2015-05-01 NOTE — Telephone Encounter (Signed)
Pt would like to switch to dr fry. Pt fry sees her son alex Taves

## 2015-05-04 NOTE — Telephone Encounter (Signed)
Pt is aware.  

## 2015-05-26 DIAGNOSIS — Z1231 Encounter for screening mammogram for malignant neoplasm of breast: Secondary | ICD-10-CM | POA: Diagnosis not present

## 2015-05-26 DIAGNOSIS — N83201 Unspecified ovarian cyst, right side: Secondary | ICD-10-CM | POA: Diagnosis not present

## 2015-05-26 DIAGNOSIS — N83202 Unspecified ovarian cyst, left side: Secondary | ICD-10-CM | POA: Diagnosis not present

## 2015-05-26 DIAGNOSIS — M8588 Other specified disorders of bone density and structure, other site: Secondary | ICD-10-CM | POA: Diagnosis not present

## 2015-05-26 DIAGNOSIS — Z6824 Body mass index (BMI) 24.0-24.9, adult: Secondary | ICD-10-CM | POA: Diagnosis not present

## 2015-05-26 DIAGNOSIS — N958 Other specified menopausal and perimenopausal disorders: Secondary | ICD-10-CM | POA: Diagnosis not present

## 2015-05-26 DIAGNOSIS — Z124 Encounter for screening for malignant neoplasm of cervix: Secondary | ICD-10-CM | POA: Diagnosis not present

## 2015-06-03 ENCOUNTER — Other Ambulatory Visit: Payer: Self-pay | Admitting: Internal Medicine

## 2015-06-23 DIAGNOSIS — Z23 Encounter for immunization: Secondary | ICD-10-CM | POA: Diagnosis not present

## 2015-06-23 DIAGNOSIS — D485 Neoplasm of uncertain behavior of skin: Secondary | ICD-10-CM | POA: Diagnosis not present

## 2015-06-23 DIAGNOSIS — B078 Other viral warts: Secondary | ICD-10-CM | POA: Diagnosis not present

## 2015-07-06 ENCOUNTER — Telehealth: Payer: Self-pay | Admitting: Internal Medicine

## 2015-07-06 DIAGNOSIS — Z139 Encounter for screening, unspecified: Secondary | ICD-10-CM

## 2015-07-06 NOTE — Telephone Encounter (Signed)
Patient is transferring care in May from [place] to [place].  She is going out of the country in June.  She has printed off vaccines she should have before the trip from the  [facility].  Patient needs to make sure she is up to date on her routine vacs, MMR, Diphtheria - tetanus- pertussis, varicella, polio and flu shot.  Also wants to make sure she has had hep A, typhoid, hep b, and rabies.  Patient would like call back to schedule any vacs that she may not have already had.  Husband is also transferring to [facility] and has the same questions.  I will send note on him.

## 2015-07-08 DIAGNOSIS — N83201 Unspecified ovarian cyst, right side: Secondary | ICD-10-CM | POA: Diagnosis not present

## 2015-07-08 DIAGNOSIS — N83202 Unspecified ovarian cyst, left side: Secondary | ICD-10-CM | POA: Diagnosis not present

## 2015-07-08 DIAGNOSIS — Z8544 Personal history of malignant neoplasm of other female genital organs: Secondary | ICD-10-CM | POA: Diagnosis not present

## 2015-07-09 NOTE — Telephone Encounter (Signed)
I did not check her husbands chart. If she can get her vaccine history that would be ideal. We can do a blood test for MMR, but she has had these. She may have lost some immunity, but she has had vaccines. She has had polio in the past.  She is due for Prevnar and can get that before after her trip.  She does not need hepatitis B or rabies.  It is recommended that she get hepatitis a. She can get one vaccine before she leaves and consider getting the second vaccine to get her lifelong immunity after returning.  It is also recommended that she get typhoid. If she takes the oral vaccine, which is a prescription I can provide we will provide her protection from typhoid for several years. She could also go to the health department and get an injection, which will give her lifelong protection.   Let her know that [facility] has a travel nurse they can review exactly what she needs and does not need if she is concerned and would like to talk to someone more regarding the vaccines. This is not something that is covered by her insurance.

## 2015-07-09 NOTE — Telephone Encounter (Signed)
Spoke with pt. Instructed her to call the health Dept. Pt is from [place] and may have to call [employer] for records. Pt is going to [place] to visit family. Is it necessary to have all of these before her trip. PLease advise.

## 2015-07-10 NOTE — Telephone Encounter (Signed)
Spoke with pt, Appt made to receive Hep A and Prevnar. MD okay with getting vaccines together. Titer entered for MMR

## 2015-07-14 ENCOUNTER — Other Ambulatory Visit: Payer: PPO

## 2015-07-14 ENCOUNTER — Ambulatory Visit (INDEPENDENT_AMBULATORY_CARE_PROVIDER_SITE_OTHER): Payer: PPO

## 2015-07-14 DIAGNOSIS — Z23 Encounter for immunization: Secondary | ICD-10-CM | POA: Diagnosis not present

## 2015-07-14 DIAGNOSIS — Z418 Encounter for other procedures for purposes other than remedying health state: Secondary | ICD-10-CM | POA: Diagnosis not present

## 2015-07-14 DIAGNOSIS — Z299 Encounter for prophylactic measures, unspecified: Secondary | ICD-10-CM

## 2015-07-14 DIAGNOSIS — Z139 Encounter for screening, unspecified: Secondary | ICD-10-CM | POA: Diagnosis not present

## 2015-07-14 MED ORDER — TYPHOID VACCINE PO CPDR: 1.0000 | DELAYED_RELEASE_CAPSULE | ORAL | Status: DC

## 2015-07-14 NOTE — Addendum Note (Signed)
Addended by: Binnie Rail on: 07/14/2015 08:14 PM   Modules accepted: Orders

## 2015-07-14 NOTE — Telephone Encounter (Signed)
Pt came in today for Hep A/B vaccine and the prevnar vaccine. Pt stated that they are interested in the typhoid oral inocculation. Pt also informed that titer and/or other labs were ready to be drawn downstairs.

## 2015-07-14 NOTE — Telephone Encounter (Signed)
rx sent

## 2015-07-15 LAB — MEASLES/MUMPS/RUBELLA IMMUNITY
MUMPS IGG: 130 [AU]/ml — AB (ref <9.00)
Rubella: 11.3 Index — ABNORMAL HIGH (ref <0.90)

## 2015-07-31 ENCOUNTER — Encounter: Payer: Self-pay | Admitting: Internal Medicine

## 2015-08-07 ENCOUNTER — Encounter: Payer: Self-pay | Admitting: Internal Medicine

## 2015-08-07 ENCOUNTER — Other Ambulatory Visit (INDEPENDENT_AMBULATORY_CARE_PROVIDER_SITE_OTHER): Payer: PPO

## 2015-08-07 ENCOUNTER — Ambulatory Visit (INDEPENDENT_AMBULATORY_CARE_PROVIDER_SITE_OTHER): Payer: PPO | Admitting: Internal Medicine

## 2015-08-07 VITALS — BP 114/54 | HR 72 | Temp 98.2°F | Resp 16 | Ht 64.0 in | Wt 141.0 lb

## 2015-08-07 DIAGNOSIS — M8580 Other specified disorders of bone density and structure, unspecified site: Secondary | ICD-10-CM | POA: Diagnosis not present

## 2015-08-07 DIAGNOSIS — Z23 Encounter for immunization: Secondary | ICD-10-CM | POA: Diagnosis not present

## 2015-08-07 DIAGNOSIS — C4491 Basal cell carcinoma of skin, unspecified: Secondary | ICD-10-CM | POA: Diagnosis not present

## 2015-08-07 DIAGNOSIS — M19042 Primary osteoarthritis, left hand: Secondary | ICD-10-CM

## 2015-08-07 DIAGNOSIS — K219 Gastro-esophageal reflux disease without esophagitis: Secondary | ICD-10-CM

## 2015-08-07 DIAGNOSIS — M19049 Primary osteoarthritis, unspecified hand: Secondary | ICD-10-CM | POA: Insufficient documentation

## 2015-08-07 DIAGNOSIS — Z0000 Encounter for general adult medical examination without abnormal findings: Secondary | ICD-10-CM | POA: Diagnosis not present

## 2015-08-07 DIAGNOSIS — I2510 Atherosclerotic heart disease of native coronary artery without angina pectoris: Secondary | ICD-10-CM

## 2015-08-07 DIAGNOSIS — M5416 Radiculopathy, lumbar region: Secondary | ICD-10-CM

## 2015-08-07 LAB — CBC WITH DIFFERENTIAL/PLATELET
BASOS ABS: 0 10*3/uL (ref 0.0–0.1)
Basophils Relative: 0.4 % (ref 0.0–3.0)
Eosinophils Absolute: 0.1 10*3/uL (ref 0.0–0.7)
Eosinophils Relative: 2.4 % (ref 0.0–5.0)
HEMATOCRIT: 38.3 % (ref 36.0–46.0)
Hemoglobin: 13 g/dL (ref 12.0–15.0)
LYMPHS ABS: 1.5 10*3/uL (ref 0.7–4.0)
LYMPHS PCT: 27.3 % (ref 12.0–46.0)
MCHC: 33.8 g/dL (ref 30.0–36.0)
MCV: 88.4 fl (ref 78.0–100.0)
MONOS PCT: 9.2 % (ref 3.0–12.0)
Monocytes Absolute: 0.5 10*3/uL (ref 0.1–1.0)
NEUTROS PCT: 60.7 % (ref 43.0–77.0)
Neutro Abs: 3.2 10*3/uL (ref 1.4–7.7)
Platelets: 265 10*3/uL (ref 150.0–400.0)
RBC: 4.34 Mil/uL (ref 3.87–5.11)
RDW: 13.7 % (ref 11.5–15.5)
WBC: 5.3 10*3/uL (ref 4.0–10.5)

## 2015-08-07 LAB — COMPREHENSIVE METABOLIC PANEL
ALK PHOS: 54 U/L (ref 39–117)
ALT: 9 U/L (ref 0–35)
AST: 21 U/L (ref 0–37)
Albumin: 4.6 g/dL (ref 3.5–5.2)
BILIRUBIN TOTAL: 0.5 mg/dL (ref 0.2–1.2)
BUN: 16 mg/dL (ref 6–23)
CO2: 26 mEq/L (ref 19–32)
CREATININE: 0.91 mg/dL (ref 0.40–1.20)
Calcium: 9.5 mg/dL (ref 8.4–10.5)
Chloride: 106 mEq/L (ref 96–112)
GFR: 64.23 mL/min (ref >60.00)
GLUCOSE: 99 mg/dL (ref 70–99)
Potassium: 4.5 mEq/L (ref 3.5–5.1)
Sodium: 140 mEq/L (ref 135–145)
TOTAL PROTEIN: 7.1 g/dL (ref 6.0–8.3)

## 2015-08-07 LAB — TSH: TSH: 1.41 u[IU]/mL (ref 0.35–4.50)

## 2015-08-07 LAB — LIPID PANEL
CHOL/HDL RATIO: 2
Cholesterol: 153 mg/dL (ref 0–200)
HDL: 63.8 mg/dL (ref >39.00)
LDL CALC: 72 mg/dL (ref 0–99)
NONHDL: 89.35
Triglycerides: 87 mg/dL (ref 0.0–149.0)
VLDL: 17.4 mg/dL (ref 0.0–40.0)

## 2015-08-07 MED ORDER — RANITIDINE HCL 150 MG PO TABS: ORAL_TABLET | ORAL | Status: DC

## 2015-08-07 NOTE — Patient Instructions (Addendum)
Test(s) ordered today. Your results will be released to [facility] (or called to you) after review, usually within 72hours after test completion. If any changes need to be made, you will be notified at that same time.  All other Health Maintenance issues reviewed.   All recommended immunizations and age-appropriate screenings are up-to-date or discussed.  Hep A/B vaccine administered today.   Medications reviewed and updated.  No changes recommended at this time.  Your prescription(s) have been submitted to your pharmacy. Please take as directed and contact our office if you believe you are having problem(s) with the medication(s).   Please followup in one year  Health Maintenance, Female Adopting a healthy lifestyle and getting preventive care can go a long way to promote health and wellness. Talk with your health care provider about what schedule of regular examinations is right for you. This is a good chance for you to check in with your provider about disease prevention and staying healthy. In between checkups, there are plenty of things you can do on your own. Experts have done a lot of research about which lifestyle changes and preventive measures are most likely to keep you healthy. Ask your health care provider for more information. WEIGHT AND DIET  Eat a healthy diet  Be sure to include plenty of vegetables, fruits, low-fat dairy products, and lean protein.  Do not eat a lot of foods high in solid fats, added sugars, or salt.  Get regular exercise. This is one of the most important things you can do for your health.  Most adults should exercise for at least 150 minutes each week. The exercise should increase your heart rate and make you sweat (moderate-intensity exercise).  Most adults should also do strengthening exercises at least twice a week. This is in addition to the moderate-intensity exercise.  Maintain a healthy weight  Body mass index (BMI) is a measurement that can  be used to identify possible weight problems. It estimates body fat based on height and weight. Your health care provider can help determine your BMI and help you achieve or maintain a healthy weight.  For females 52 years of age and older:   A BMI below 18.5 is considered underweight.  A BMI of 18.5 to 24.9 is normal.  A BMI of 25 to 29.9 is considered overweight.  A BMI of 30 and above is considered obese.  Watch levels of cholesterol and blood lipids  You should start having your blood tested for lipids and cholesterol at 74 years of age, then have this test every 5 years.  You may need to have your cholesterol levels checked more often if:  Your lipid or cholesterol levels are high.  You are older than 74 years of age.  You are at high risk for heart disease.  CANCER SCREENING   Lung Cancer  Lung cancer screening is recommended for adults 48-22 years old who are at high risk for lung cancer because of a history of smoking.  A yearly low-dose CT scan of the lungs is recommended for people who:  Currently smoke.  Have quit within the past 15 years.  Have at least a 30-pack-year history of smoking. A pack year is smoking an average of one pack of cigarettes a day for 1 year.  Yearly screening should continue until it has been 15 years since you quit.  Yearly screening should stop if you develop a health problem that would prevent you from having lung cancer treatment.  Breast Cancer  breast self-awareness. This means understanding how your breasts normally appear and feel.  It also means doing regular breast self-exams. Let your health care provider know about any changes, no matter how small.  If you are in your 20s or 30s, you should have a clinical breast exam (CBE) by a health care provider every 1-3 years as part of a regular health exam.  If you are 40 or older, have a CBE every year. Also consider having a breast X-ray (mammogram) every year.  If  you have a family history of breast cancer, talk to your health care provider about genetic screening.  If you are at high risk for breast cancer, talk to your health care provider about having an MRI and a mammogram every year.  Breast cancer gene (BRCA) assessment is recommended for women who have family members with BRCA-related cancers. BRCA-related cancers include:  Breast.  Ovarian.  Tubal.  Peritoneal cancers.  Results of the assessment will determine the need for genetic counseling and BRCA1 and BRCA2 testing. Cervical Cancer Your health care provider may recommend that you be screened regularly for cancer of the pelvic organs (ovaries, uterus, and vagina). This screening involves a pelvic examination, including checking for microscopic changes to the surface of your cervix (Pap test). You may be encouraged to have this screening done every 3 years, beginning at age 21.  For women ages 30-65, health care providers may recommend pelvic exams and Pap testing every 3 years, or they may recommend the Pap and pelvic exam, combined with testing for human papilloma virus (HPV), every 5 years. Some types of HPV increase your risk of cervical cancer. Testing for HPV may also be done on women of any age with unclear Pap test results.  Other health care providers may not recommend any screening for nonpregnant women who are considered low risk for pelvic cancer and who do not have symptoms. Ask your health care provider if a screening pelvic exam is right for you.  If you have had past treatment for cervical cancer or a condition that could lead to cancer, you need Pap tests and screening for cancer for at least 20 years after your treatment. If Pap tests have been discontinued, your risk factors (such as having a new sexual partner) need to be reassessed to determine if screening should resume. Some women have medical problems that increase the chance of getting cervical cancer. In these cases,  your health care provider may recommend more frequent screening and Pap tests. Colorectal Cancer  This type of cancer can be detected and often prevented.  Routine colorectal cancer screening usually begins at 74 years of age and continues through 75 years of age.  Your health care provider may recommend screening at an earlier age if you have risk factors for colon cancer.  Your health care provider may also recommend using home test kits to check for hidden blood in the stool.  A small camera at the end of a tube can be used to examine your colon directly (sigmoidoscopy or colonoscopy). This is done to check for the earliest forms of colorectal cancer.  Routine screening usually begins at age 50.  Direct examination of the colon should be repeated every 5-10 years through 75 years of age. However, you may need to be screened more often if early forms of precancerous polyps or small growths are found. Skin Cancer  Check your skin from head to toe regularly.  Tell your health care provider about any new   moles or changes in moles, especially if there is a change in a mole's shape or color.  Also tell your health care provider if you have a mole that is larger than the size of a pencil eraser.  Always use sunscreen. Apply sunscreen liberally and repeatedly throughout the day.  Protect yourself by wearing long sleeves, pants, a wide-brimmed hat, and sunglasses whenever you are outside. HEART DISEASE, DIABETES, AND HIGH BLOOD PRESSURE   High blood pressure causes heart disease and increases the risk of stroke. High blood pressure is more likely to develop in:  People who have blood pressure in the high end of the normal range (130-139/85-89 mm Hg).  People who are overweight or obese.  People who are African American.  If you are 18-39 years of age, have your blood pressure checked every 3-5 years. If you are 40 years of age or older, have your blood pressure checked every year. You  should have your blood pressure measured twice--once when you are at a hospital or clinic, and once when you are not at a hospital or clinic. Record the average of the two measurements. To check your blood pressure when you are not at a hospital or clinic, you can use:  An automated blood pressure machine at a pharmacy.  A home blood pressure monitor.  If you are between 55 years and 79 years old, ask your health care provider if you should take aspirin to prevent strokes.  Have regular diabetes screenings. This involves taking a blood sample to check your fasting blood sugar level.  If you are at a normal weight and have a low risk for diabetes, have this test once every three years after 74 years of age.  If you are overweight and have a high risk for diabetes, consider being tested at a younger age or more often. PREVENTING INFECTION  Hepatitis B  If you have a higher risk for hepatitis B, you should be screened for this virus. You are considered at high risk for hepatitis B if:  You were born in a country where hepatitis B is common. Ask your health care provider which countries are considered high risk.  Your parents were born in a high-risk country, and you have not been immunized against hepatitis B (hepatitis B vaccine).  You have HIV or AIDS.  You use needles to inject street drugs.  You live with someone who has hepatitis B.  You have had sex with someone who has hepatitis B.  You get hemodialysis treatment.  You take certain medicines for conditions, including cancer, organ transplantation, and autoimmune conditions. Hepatitis C  Blood testing is recommended for:  Everyone born from 1945 through 1965.  Anyone with known risk factors for hepatitis C. Sexually transmitted infections (STIs)  You should be screened for sexually transmitted infections (STIs) including gonorrhea and chlamydia if:  You are sexually active and are younger than 74 years of age.  You  are older than 74 years of age and your health care provider tells you that you are at risk for this type of infection.  Your sexual activity has changed since you were last screened and you are at an increased risk for chlamydia or gonorrhea. Ask your health care provider if you are at risk.  If you do not have HIV, but are at risk, it may be recommended that you take a prescription medicine daily to prevent HIV infection. This is called pre-exposure prophylaxis (PrEP). You are considered at risk if:    You are sexually active and do not regularly use condoms or know the HIV status of your partner(s).  You take drugs by injection.  You are sexually active with a partner who has HIV. Talk with your health care provider about whether you are at high risk of being infected with HIV. If you choose to begin PrEP, you should first be tested for HIV. You should then be tested every 3 months for as long as you are taking PrEP.  PREGNANCY   If you are premenopausal and you may become pregnant, ask your health care provider about preconception counseling.  If you may become pregnant, take 400 to 800 micrograms (mcg) of folic acid every day.  If you want to prevent pregnancy, talk to your health care provider about birth control (contraception). OSTEOPOROSIS AND MENOPAUSE   Osteoporosis is a disease in which the bones lose minerals and strength with aging. This can result in serious bone fractures. Your risk for osteoporosis can be identified using a bone density scan.  If you are 65 years of age or older, or if you are at risk for osteoporosis and fractures, ask your health care provider if you should be screened.  Ask your health care provider whether you should take a calcium or vitamin D supplement to lower your risk for osteoporosis.  Menopause may have certain physical symptoms and risks.  Hormone replacement therapy may reduce some of these symptoms and risks. Talk to your health care  provider about whether hormone replacement therapy is right for you.  HOME CARE INSTRUCTIONS   Schedule regular health, dental, and eye exams.  Stay current with your immunizations.   Do not use any tobacco products including cigarettes, chewing tobacco, or electronic cigarettes.  If you are pregnant, do not drink alcohol.  If you are breastfeeding, limit how much and how often you drink alcohol.  Limit alcohol intake to no more than 1 drink per day for nonpregnant women. One drink equals 12 ounces of beer, 5 ounces of wine, or 1 ounces of hard liquor.  Do not use street drugs.  Do not share needles.  Ask your health care provider for help if you need support or information about quitting drugs.  Tell your health care provider if you often feel depressed.  Tell your health care provider if you have ever been abused or do not feel safe at home.   This information is not intended to replace advice given to you by your health care provider. Make sure you discuss any questions you have with your health care provider.   Document Released: 10/04/2010 Document Revised: 04/11/2014 Document Reviewed: 02/20/2013 Elsevier Interactive Patient Education 2016 [employer]     

## 2015-08-07 NOTE — Assessment & Plan Note (Addendum)
dexa up to date - managed by gyn Stressed regular walking Taking cal, vitamin d

## 2015-08-07 NOTE — Progress Notes (Signed)
Pre visit review using our clinic review tool, if applicable. No additional management support is needed unless otherwise documented below in the visit note. 

## 2015-08-07 NOTE — Assessment & Plan Note (Signed)
S/p partial laminectomy Pain much improved -has pain of moderate intensity about once a month Takes tylenol, rarely takes oxycodone Exercising regularly

## 2015-08-07 NOTE — Progress Notes (Signed)
Subjective:    Patient ID: [name], female    DOB: May 05, 1940, 75 y.o.   MRN: [id number]  HPI She is here to establish with a new pcp.  She is here for a physical exam.   She has no concerns.  She denies any changes in her history.    Back pain:  Over three years ago her hip started hurting.  She saw several doctors and had injections and other treatments and she eventually had a partial laminectomy.  She has scoliosis and several other issues.  Surgery did reveal some of the issues.  She now has pain once a month at an intensity of 4-5/10 pain.  She takes the pain medication only as needed.  Sometimes she just takes tylenol.  She averages 1-2 oxycodone a month.   Hand arthritis:  She has mild hand arthritis left hand DIP joints.    Medications and allergies reviewed with patient and updated if appropriate.  Patient Active Problem List   Diagnosis Date Noted  . Left lumbar radiculopathy 07/03/2014  . Bursitis of left hip 02/21/2013  . Sacroiliac dysfunction 02/21/2013  . Basal cell cancer 06/25/2012  . Anemia 06/25/2012  . GERD (gastroesophageal reflux disease) 05/30/2011  . CAD, NATIVE VESSEL 06/17/2010  . ABNORMAL STRESS ELECTROCARDIOGRAM 06/01/2010  . PALPITATIONS 05/24/2010  . DYSPNEA/SHORTNESS OF BREATH 05/24/2010  . ARTHRALGIA 05/21/2009  . Diverticulosis of large intestine 05/19/2008  . HYPERLIPIDEMIA 05/15/2007  . Osteopenia 05/10/2007    Current Outpatient Prescriptions on File Prior to Visit  Medication Sig Dispense Refill  . aspirin 81 MG tablet Take 81 mg by mouth daily.      . Black Currant Seed Oil 500 MG CAPS Take by mouth daily.     . Calcium Citrate-Vitamin D (CITRUS CALCIUM 1500 + D PO) Take by mouth daily.     . Coenzyme Q10 (COQ10) 200 MG CAPS Take by mouth daily.    . CRESTOR 10 MG tablet TAKE 1 TABLET BY MOUTH AT BEDTIME 90 tablet 3  . Gelatin 600 MG CAPS Take by mouth daily.    . Multiple Vitamin (MULTIVITAMIN) capsule Take 1 capsule by mouth  daily.      . nitroGLYCERIN (NITROSTAT) 0.4 MG SL tablet Place 0.4 mg under the tongue every 5 (five) minutes as needed.      . ranitidine (ZANTAC) 150 MG tablet TAKE 1 TABLET (150 MG TOTAL) BY MOUTH DAILY. 90 tablet 0  . typhoid (VIVOTIF) DR capsule Take 1 capsule by mouth every other day. 4 capsule 0   No current facility-administered medications on file prior to visit.    Past Medical History  Diagnosis Date  . Hyperlipidemia     intol. to statins in past  . Osteopenia     Dr Radene Knee, Gynecology  . Herpes zoster     1997 L flank  . Diverticulosis of colon   . CAD (coronary artery disease)     Dr Aundra Dubin    Past Surgical History  Procedure Laterality Date  . Varicose vein surgery  1971  . Cataract extraction  2010    OD  , Dr. Herbert Deaner  . Coronary angioplasty with stent placement  06/19/2010    2 vessel; Dr Burt Knack  . Colonoscopy  2007    [facility]  . Cataract extraction w/ intraocular lens implant Left 02/2014    Vision not significantly improved    Social History   Social History  . Marital Status: Married  Spouse Name: N/A  . Number of Children: N/A  . Years of Education: N/A   Social History Main Topics  . Smoking status: Never Smoker   . Smokeless tobacco: Never Used  . Alcohol Use: Yes     Comment: extremely rarely  . Drug Use: No  . Sexual Activity: Not Asked   Other Topics Concern  . None   Social History Narrative    Family History  Problem Relation Age of Onset  . Osteoarthritis Mother   . Skin cancer Mother     ? melanoma  . Heart failure Mother   . Heart attack Maternal Uncle 59  . Cancer Maternal Grandmother     ? GI  . Asthma Son     EIB  . CVA Paternal Grandmother   . Diabetes Paternal Grandmother     prediabetic  . Esophageal cancer Father 20  . CVA Mother 34  . Deep vein thrombosis Paternal Grandmother   . Heart attack Father 41    smoker    Review of Systems  Constitutional: Negative for fever, chills, appetite change and  fatigue.  HENT: Negative for hearing loss.   Eyes: Negative for visual disturbance.  Respiratory: Positive for shortness of breath (mild at rest, ? related to scoliosis). Negative for cough and wheezing.   Cardiovascular: Positive for chest pain (occasional, mild). Negative for palpitations and leg swelling.  Gastrointestinal: Negative for nausea, abdominal pain, diarrhea, constipation and blood in stool.       No gerd  Endocrine: Negative for polydipsia and polyuria.  Genitourinary: Negative for dysuria and hematuria.  Musculoskeletal: Positive for myalgias, back pain and arthralgias (hand arthritis).  Skin: Negative for color change and rash.  Neurological: Negative for dizziness, light-headedness and headaches.  Psychiatric/Behavioral: Negative for sleep disturbance and dysphoric mood. The patient is not nervous/anxious.        Objective:   Filed Vitals:   08/07/15 0832  BP: 114/54  Pulse: 72  Temp: 98.2 F (36.8 C)  Resp: 16   Filed Weights   08/07/15 0832  Weight: 141 lb (63.957 kg)   Body mass index is 24.19 kg/(m^2).   Physical Exam Constitutional: She appears well-developed and well-nourished. No distress.  HENT:  Head: Normocephalic and atraumatic.  Right Ear: External ear normal. Normal ear canal and TM Left Ear: External ear normal.  Normal ear canal and TM Mouth/Throat: Oropharynx is clear and moist.  Eyes: Conjunctivae and EOM are normal.  Neck: Neck supple. No tracheal deviation present. No thyromegaly present.  No carotid bruit  Cardiovascular: Normal rate, regular rhythm and normal heart sounds.   No murmur heard.  No edema. Pulmonary/Chest: Effort normal and breath sounds normal. No respiratory distress. She has no wheezes. She has no rales.  Breast: deferred to Gyn Abdominal: Soft. She exhibits no distension. There is no tenderness.  Lymphadenopathy: She has no cervical adenopathy.  Skin: Skin is warm and dry. She is not diaphoretic.  Psychiatric:  She has a normal mood and affect. Her behavior is normal.         Assessment & Plan:   Physical exam: Screening blood work  ordered Immunizations - discussed shingles vaccines - will check with insurance company, other immunizations up to date Colonoscopy Up to date  Mammogram  Up to date  Gyn Up to date Dexa  Up to date  Eye exams  Up to date  EKG   Done by cardiology Exercise - regular - will work on increasing walking Weight -  normal BMI Skin - sees derm annually Substance abuse - none   See Problem List for Assessment and Plan of chronic medical problems.  F/u in one year for a PE

## 2015-08-07 NOTE — Assessment & Plan Note (Signed)
Mild otc meds if needed

## 2015-08-07 NOTE — Assessment & Plan Note (Signed)
GERD controlled Continue daily medication  

## 2015-08-07 NOTE — Addendum Note (Signed)
Addended by: Terence Lux B on: 08/07/2015 10:22 AM   Modules accepted: Orders

## 2015-08-07 NOTE — Assessment & Plan Note (Signed)
Follows with derm annually 

## 2015-08-07 NOTE — Assessment & Plan Note (Signed)
Check cmp, lipid Following with cardio Continue asa, statin

## 2015-08-17 ENCOUNTER — Telehealth: Payer: Self-pay | Admitting: Internal Medicine

## 2015-08-17 NOTE — Telephone Encounter (Signed)
Pt got it through [employer]. Cancel this req

## 2015-08-17 NOTE — Telephone Encounter (Signed)
Is requesting call with lab results.

## 2015-11-10 ENCOUNTER — Encounter: Payer: Self-pay | Admitting: Cardiology

## 2015-11-24 ENCOUNTER — Encounter: Payer: Self-pay | Admitting: Cardiology

## 2015-11-24 ENCOUNTER — Ambulatory Visit (INDEPENDENT_AMBULATORY_CARE_PROVIDER_SITE_OTHER): Payer: PPO | Admitting: Cardiology

## 2015-11-24 VITALS — BP 112/72 | HR 74 | Ht 64.0 in | Wt 141.0 lb

## 2015-11-24 DIAGNOSIS — I2510 Atherosclerotic heart disease of native coronary artery without angina pectoris: Secondary | ICD-10-CM | POA: Diagnosis not present

## 2015-11-24 DIAGNOSIS — E782 Mixed hyperlipidemia: Secondary | ICD-10-CM

## 2015-11-24 NOTE — Patient Instructions (Signed)
Medication Instructions:  Your physician recommends that you continue on your current medications as directed. Please refer to the Current Medication list given to you today.   Labwork: None   Testing/Procedures: None   Follow-Up: Your physician wants you to follow-up in: 1 year with Dr Burt Knack. (August 2018).  You will receive a reminder letter in the mail two months in advance. If you don't receive a letter, please call our office to schedule the follow-up appointment.        If you need a refill on your cardiac medications before your next appointment, please call your pharmacy.

## 2015-11-25 NOTE — Progress Notes (Signed)
Patient ID: [name], female   DOB: Feb 16, 1941, 75 y.o.   MRN: [id number] PCP: Dr. Quay Burow  75 yo with history of hyperlipidemia and CAD returns after PCI to RCA and LAD. Patient had noted new dyspnea [place]. She was set up for an ETT. This showed below average exercise tolerance and diffuse ST depression in multiple leads. She was set up for LHC, which showed 80% stenosis of the proximal LAD at D2, 80% ostial D2, and subtotal occlusion of the proximal RCA with left to right collaterals. After extensive discussion, we decided on PCI to chronically totally occluded RCA as well as LAD rather than CABG. This was done in 3/12 with 1 Promus DES to the RCA and 1 Promus DES to the LAD.  Given recurrent exertional dyspnea,  I did an ETT-Myoview in 4/13 that showed no ischemia or infarction.  I stopped her beta blocker and she seemed to feel better. She also had a cardiopulmonary exercise test in 3/14.  This was actually basically a normal study, showing no significant cardiac or pulmonary problem.  Since last appointment, she had a back operation in 8/17 with considerable improvement in her back pain.  She is no longer having any significant exertional dyspnea.  No recent chest pain.  She exercises regularly, walking and weights.  Weight is stable.   Labs (8/12): LDL 65, HDL 54 Labs (3/13): LDL 62, HDL 60, LFTs normal Labs (3/14): TSH normal, HCT 36, LDL 67, HDL 58 Labs (5/17): TSH normal, K 4.5, creatinine 0.94, hgb 13  ECG: NSR, normal  Past Medical History:  1. Hyperlipidemia - intolerance to pravastatin and Crestor 10 mg daily (myalgias)  2. Osteopenia 3. Herpes zoster 1997 L flank  4. Diverticulosis, colon  5. CAD: Exertional dyspnea led to ETT in 2/12. This showed significant ST depression in a number of leads. She had LHC (3/12) showing 80% proximal LAD at D2, 80% ostial D2, and subtotal occlusion of a dominant RCA with good L=>R collaterals. EF 60%  with basal inferior hypokinesis. Patient had PCI to RCA with 3 x 20 Promus DES and PCI to LAD with 2.75 x 16 Promus DES with good results.  ETT-Myoview (4/13): EF 69%, no ischemia or infarction.  Echo (3/14): EF 123456, mild diastolic dysfunction.  CPX (3/14): VO2 max 19 (95% predicted), VE/VCO2 slope 28, RER 1.1, mild obstruction on PFTs.  6. Sciatica: back surgery 8/16.    Family History:  Father:Esophageal cancer , smoker (died @ 95)  Mother: CHF, diverticulosis, arthritis, skin cancer ,died of CVA @ 49  Siblings: bro: excess tobacco & alcohol; P uncle pancreatic cancer  P Grandmother: CVA (died @ 16), pre-DM, DVT  MGM: cancer , ? stomach; M uncle: MI > 62  1st Cousin: RA  Grandson:?" hole in heart"  Cousin: Thalassemia   Social History:  Never Smoked;  Married  Alcohol use-yes: extremely rarely.   ROS: All systems reviewed and negative except as per HPI.   Current Outpatient Prescriptions  Medication Sig Dispense Refill  . aspirin 81 MG tablet Take 81 mg by mouth daily.      . Black Currant Seed Oil 500 MG CAPS Take by mouth daily.     . Calcium Citrate-Vitamin D (CITRUS CALCIUM 1500 + D PO) Take by mouth daily.     . Coenzyme Q10 (COQ10) 200 MG CAPS Take by mouth daily.    . CRESTOR 10 MG tablet TAKE 1 TABLET  BY MOUTH AT BEDTIME 90 tablet 3  . Gelatin 600 MG CAPS Take by mouth daily.    . Multiple Vitamin (MULTIVITAMIN) capsule Take 1 capsule by mouth daily.      . nitroGLYCERIN (NITROSTAT) 0.4 MG SL tablet Place 0.4 mg under the tongue every 5 (five) minutes as needed.      Marland Kitchen oxyCODONE-acetaminophen (PERCOCET) 10-325 MG tablet Take 1 tablet by mouth every 4 (four) hours as needed for pain.    . ranitidine (ZANTAC) 150 MG tablet TAKE 1 TABLET (150 MG TOTAL) BY MOUTH DAILY. 90 tablet 3  . typhoid (VIVOTIF) DR capsule Take 1 capsule by mouth every other day. 4 capsule 0   No current facility-administered medications for this visit.     BP 112/72   Pulse 74   Ht 5\' 4"  (1.626  m)   Wt 141 lb (64 kg)   BMI 24.20 kg/m  General: NAD Neck: No JVD, no thyromegaly or thyroid nodule.  Lungs: Clear to auscultation bilaterally with normal respiratory effort. CV: Nondisplaced PMI.  Heart regular S1/S2, no S3/S4, no murmur.  No peripheral edema.  No carotid bruit.  Normal pedal pulses.  Abdomen: Soft, nontender, no hepatosplenomegaly, no distention.  Neurologic: Alert and oriented x 3.  Psych: Normal affect. Extremities: No clubbing or cyanosis.   Assessment/Plan: 1. CAD: Last myoview in 4/13 was normal.  Echo in 3/14 was unremarkable.  Doing well from a cardiac standpoint.  She will continue ASA 81 and Crestor.  She is off metoprolol as it seemed to make her feel fatigued and dyspneic.  No chest pain.  2. Dyspnea: CPX done in 3/14 showed no significant cardiac or pulmonary limitation. The dyspnea that she had in the past seems to have resolved.  3. Hyperlipidemia: Continue Crestor.  Good lipids in 5/17.   Given my transition to CHF clinic, she will followup in 1 year with Dr Burt Knack.    [name] 11/25/2015

## 2015-12-10 ENCOUNTER — Telehealth: Payer: Self-pay

## 2015-12-10 ENCOUNTER — Other Ambulatory Visit (INDEPENDENT_AMBULATORY_CARE_PROVIDER_SITE_OTHER): Payer: PPO

## 2015-12-10 DIAGNOSIS — R300 Dysuria: Secondary | ICD-10-CM

## 2015-12-10 LAB — URINALYSIS, ROUTINE W REFLEX MICROSCOPIC
Bilirubin Urine: NEGATIVE
Ketones, ur: NEGATIVE
Nitrite: POSITIVE — AB
PH: 6 (ref 5.0–8.0)
SPECIFIC GRAVITY, URINE: 1.025 (ref 1.000–1.030)
TOTAL PROTEIN, URINE-UPE24: NEGATIVE
URINE GLUCOSE: NEGATIVE
UROBILINOGEN UA: 0.2 (ref 0.0–1.0)

## 2015-12-10 MED ORDER — CEPHALEXIN 500 MG PO CAPS: 500.0000 mg | ORAL_CAPSULE | Freq: Two times a day (BID) | ORAL | 0 refills | Status: DC

## 2015-12-10 NOTE — Telephone Encounter (Signed)
Spoke with pt to inform.  

## 2015-12-10 NOTE — Telephone Encounter (Signed)
Patient states she has a UTI and would like a rx for it. She is willing to come drop off a sample. Please advise or follow up, Thank you.

## 2015-12-10 NOTE — Telephone Encounter (Signed)
Urine ordered.   Antibiotic sent to pharmacy.

## 2015-12-10 NOTE — Telephone Encounter (Signed)
PLease advise if youre okay with this.

## 2015-12-13 LAB — URINE CULTURE

## 2015-12-24 ENCOUNTER — Other Ambulatory Visit: Payer: Self-pay | Admitting: Internal Medicine

## 2015-12-24 ENCOUNTER — Telehealth: Payer: Self-pay | Admitting: Internal Medicine

## 2015-12-24 ENCOUNTER — Telehealth: Payer: Self-pay | Admitting: Emergency Medicine

## 2015-12-24 ENCOUNTER — Other Ambulatory Visit (INDEPENDENT_AMBULATORY_CARE_PROVIDER_SITE_OTHER): Payer: PPO

## 2015-12-24 DIAGNOSIS — R300 Dysuria: Secondary | ICD-10-CM | POA: Diagnosis not present

## 2015-12-24 LAB — URINALYSIS, ROUTINE W REFLEX MICROSCOPIC
Bilirubin Urine: NEGATIVE
Ketones, ur: NEGATIVE
Nitrite: POSITIVE — AB
Specific Gravity, Urine: 1.01 (ref 1.000–1.030)
Urine Glucose: NEGATIVE
Urobilinogen, UA: 0.2 (ref 0.0–1.0)
pH: 6 (ref 5.0–8.0)

## 2015-12-24 MED ORDER — NITROFURANTOIN MONOHYD MACRO 100 MG PO CAPS
100.0000 mg | ORAL_CAPSULE | Freq: Two times a day (BID) | ORAL | 0 refills | Status: DC
Start: 2015-12-24 — End: 2015-12-25

## 2015-12-24 NOTE — Telephone Encounter (Signed)
Spoke with pt, no appts available this evening. Pt will bring specimen to the lab.

## 2015-12-24 NOTE — Telephone Encounter (Signed)
Urine ordered entered.

## 2015-12-24 NOTE — Telephone Encounter (Signed)
Please advise 

## 2015-12-24 NOTE — Telephone Encounter (Signed)
Her urine shows a probable infection - an antibiotic was sent to her pharmacy.

## 2015-12-24 NOTE — Telephone Encounter (Signed)
Come in to see someone if appt available - if not drop off urine today - UA, UCx

## 2015-12-24 NOTE — Telephone Encounter (Signed)
Pt called in and said that her uti is back.  She finished up her meds like a week ago and she said that it is back.  She would like to know what she should do now?    Best number [phone]

## 2015-12-25 MED ORDER — AMOXICILLIN-POT CLAVULANATE 875-125 MG PO TABS: 1.0000 | ORAL_TABLET | Freq: Two times a day (BID) | ORAL | 0 refills | Status: DC

## 2015-12-25 NOTE — Telephone Encounter (Signed)
Please send in a different antibiotic

## 2015-12-25 NOTE — Telephone Encounter (Signed)
[employer]

## 2015-12-25 NOTE — Telephone Encounter (Signed)
Spoke with pt , she already picked up the 1st prescription sent to pharmacy. No need to follow-up with PA

## 2015-12-25 NOTE — Addendum Note (Signed)
Addended by: Binnie Rail on: 12/25/2015 01:30 PM   Modules accepted: Orders

## 2015-12-25 NOTE — Telephone Encounter (Signed)
Pt states she is hurting and would like to know the status of the PA.  I explained the process to her

## 2015-12-25 NOTE — Telephone Encounter (Signed)
PA initiated via CoverMyMeds key [id number]

## 2015-12-25 NOTE — Telephone Encounter (Signed)
Spoke with pt to inform.  

## 2015-12-26 LAB — URINE CULTURE

## 2016-01-14 DIAGNOSIS — N83299 Other ovarian cyst, unspecified side: Secondary | ICD-10-CM | POA: Diagnosis not present

## 2016-01-20 ENCOUNTER — Ambulatory Visit (INDEPENDENT_AMBULATORY_CARE_PROVIDER_SITE_OTHER): Payer: PPO | Admitting: Geriatric Medicine

## 2016-01-20 DIAGNOSIS — Z23 Encounter for immunization: Secondary | ICD-10-CM | POA: Diagnosis not present

## 2016-01-30 ENCOUNTER — Other Ambulatory Visit: Payer: Self-pay | Admitting: Cardiology

## 2016-02-01 ENCOUNTER — Other Ambulatory Visit: Payer: Self-pay | Admitting: Cardiology

## 2016-02-01 DIAGNOSIS — D3132 Benign neoplasm of left choroid: Secondary | ICD-10-CM | POA: Diagnosis not present

## 2016-02-01 DIAGNOSIS — H40013 Open angle with borderline findings, low risk, bilateral: Secondary | ICD-10-CM | POA: Diagnosis not present

## 2016-02-01 DIAGNOSIS — H35363 Drusen (degenerative) of macula, bilateral: Secondary | ICD-10-CM | POA: Diagnosis not present

## 2016-02-01 DIAGNOSIS — Z961 Presence of intraocular lens: Secondary | ICD-10-CM | POA: Diagnosis not present

## 2016-02-03 DIAGNOSIS — M79645 Pain in left finger(s): Secondary | ICD-10-CM | POA: Diagnosis not present

## 2016-02-03 DIAGNOSIS — M19041 Primary osteoarthritis, right hand: Secondary | ICD-10-CM | POA: Diagnosis not present

## 2016-02-03 DIAGNOSIS — M79644 Pain in right finger(s): Secondary | ICD-10-CM | POA: Diagnosis not present

## 2016-02-03 DIAGNOSIS — M67442 Ganglion, left hand: Secondary | ICD-10-CM | POA: Diagnosis not present

## 2016-03-18 DIAGNOSIS — Z85828 Personal history of other malignant neoplasm of skin: Secondary | ICD-10-CM | POA: Diagnosis not present

## 2016-03-18 DIAGNOSIS — L821 Other seborrheic keratosis: Secondary | ICD-10-CM | POA: Diagnosis not present

## 2016-03-18 DIAGNOSIS — Z411 Encounter for cosmetic surgery: Secondary | ICD-10-CM | POA: Diagnosis not present

## 2016-04-26 DIAGNOSIS — L905 Scar conditions and fibrosis of skin: Secondary | ICD-10-CM | POA: Diagnosis not present

## 2016-06-07 DIAGNOSIS — L905 Scar conditions and fibrosis of skin: Secondary | ICD-10-CM | POA: Diagnosis not present

## 2016-06-30 DIAGNOSIS — Z01419 Encounter for gynecological examination (general) (routine) without abnormal findings: Secondary | ICD-10-CM | POA: Diagnosis not present

## 2016-06-30 DIAGNOSIS — Z1231 Encounter for screening mammogram for malignant neoplasm of breast: Secondary | ICD-10-CM | POA: Diagnosis not present

## 2016-06-30 DIAGNOSIS — Z6824 Body mass index (BMI) 24.0-24.9, adult: Secondary | ICD-10-CM | POA: Diagnosis not present

## 2016-07-13 ENCOUNTER — Telehealth: Payer: Self-pay | Admitting: General Practice

## 2016-07-13 NOTE — Telephone Encounter (Signed)
I spoke with the patient's husband and confirmed that Dr. Quay Burow is their current PCP since Dr. Linna Darner retired. Duane Lope ([place])

## 2016-07-21 ENCOUNTER — Other Ambulatory Visit: Payer: Self-pay | Admitting: Internal Medicine

## 2016-08-07 ENCOUNTER — Encounter: Payer: Self-pay | Admitting: Internal Medicine

## 2016-08-07 NOTE — Progress Notes (Signed)
Subjective:    Patient ID: [name], female    DOB: 1940-11-28, 76 y.o.   MRN: [id number]  HPI Here for medicare wellness exam and an annual physical exam.   I have personally reviewed and have noted 1.The patient's medical and social history 2.Their use of alcohol, tobacco or illicit drugs 3.Their current medications and supplements 4.The patient's functional ability including ADL's, fall risks, home safety risks and hearing or visual impairment. 5.Diet and physical activities 6.Evidence for depression or mood disorders 7.Care team reviewed - Cardiology - Dr  Aundra Dubin, Gyn - Dr Radene Knee, [place] - Dr Herbert Deaner, Derm - Dr Jari Pigg   Are there smokers in your home (other than you)? No  Risk Factors Exercise: crunches, weights, walking some Dietary issues discussed: well balanced and very healthy - limits fatty food and sweets  Cardiac risk factors: advanced age, hyperlipidemia, known CAD  Depression Screen  Have you felt down, depressed or hopeless? No  Have you felt little interest or pleasure in doing things?  No  Activities of Daily Living In your present state of health, do you have any difficulty performing the following activities?:  Driving? No Managing money?  No Feeding yourself? No Getting from bed to chair? No Climbing a flight of stairs? No Preparing food and eating?: No Bathing or showering? No Getting dressed: No Getting to/using the toilet? No Moving around from place to place: No In the past year have you fallen or had a near fall?: No   Are you sexually active?  yes  Do you have more than one partner?  No   Hearing Difficulties: No Do you often ask people to speak up or repeat themselves? No Do you experience ringing or noises in your ears? No Do you have difficulty understanding soft or whispered voices? No Vision:              Any change in vision: no              Up to date with eye  exam: yes Memory:  Do you feel that you have a problem with memory? No  Do you often misplace items? No  Do you feel safe at home?  Yes  Cognitive Testing  Alert, Orientated? Yes  Normal Appearance? Yes  Recall of three objects?  Yes  Can perform simple calculations? Yes  Displays appropriate judgment? Yes  Can read the correct time from a watch face? Yes   Advanced Directives have been discussed with the patient? Yes - in place   Medications and allergies reviewed with patient and updated if appropriate.  Patient Active Problem List   Diagnosis Date Noted  . Scoliosis 08/08/2016  . Lumbar stenosis 08/08/2016  . Osteoarthritis, hand 08/07/2015  . Left lumbar radiculopathy 07/03/2014  . Bursitis of left hip 02/21/2013  . Sacroiliac dysfunction 02/21/2013  . Malignant basal cell neoplasm of skin 06/25/2012  . GERD (gastroesophageal reflux disease) 05/30/2011  . CAD, NATIVE VESSEL 06/17/2010  . ABNORMAL STRESS ELECTROCARDIOGRAM 06/01/2010  . PALPITATIONS 05/24/2010  . ARTHRALGIA 05/21/2009  . Diverticulosis of large intestine 05/19/2008  . HYPERLIPIDEMIA 05/15/2007  . Osteopenia 05/10/2007    Current Outpatient Prescriptions on File Prior to Visit  Medication Sig Dispense Refill  . aspirin 81 MG tablet Take 81 mg by mouth daily.      . Black Currant Seed Oil 500 MG CAPS Take by mouth daily.     . Calcium Citrate-Vitamin D (CITRUS CALCIUM 1500 + D PO)  Take by mouth daily.     . Coenzyme Q10 (COQ10) 200 MG CAPS Take by mouth daily.    . Gelatin 600 MG CAPS Take by mouth daily.    . Multiple Vitamin (MULTIVITAMIN) capsule Take 1 capsule by mouth daily.      . nitroGLYCERIN (NITROSTAT) 0.4 MG SL tablet Place 0.4 mg under the tongue every 5 (five) minutes as needed.      Marland Kitchen oxyCODONE-acetaminophen (PERCOCET) 10-325 MG tablet Take 1 tablet by mouth every 4 (four) hours as needed for pain.    . ranitidine (ZANTAC) 150 MG tablet TAKE 1 TABLET (150 MG TOTAL) BY MOUTH DAILY. 90  tablet 0  . rosuvastatin (CRESTOR) 10 MG tablet TAKE 1 TABLET BY MOUTH AT BEDTIME 90 tablet 2   No current facility-administered medications on file prior to visit.     Past Medical History:  Diagnosis Date  . CAD (coronary artery disease)    Dr Aundra Dubin  . Diverticulosis of colon   . Herpes zoster    1997 L flank  . Hyperlipidemia    intol. to statins in past  . Osteopenia    Dr Radene Knee, Gynecology    Past Surgical History:  Procedure Laterality Date  . CATARACT EXTRACTION  2010   OD  , Dr. Herbert Deaner  . CATARACT EXTRACTION W/ INTRAOCULAR LENS IMPLANT Left 02/2014   Vision not significantly improved  . COLONOSCOPY  2007   [facility]  . CORONARY ANGIOPLASTY WITH STENT PLACEMENT  06/19/2010   2 vessel; Dr Burt Knack  . [place]    Social History   Social History  . Marital status: Married    Spouse name: N/A  . Number of children: N/A  . Years of education: N/A   Social History Main Topics  . Smoking status: Never Smoker  . Smokeless tobacco: Never Used  . Alcohol use Yes     Comment: extremely rarely  . Drug use: No  . Sexual activity: Not Asked   Other Topics Concern  . None   Social History Narrative   Exercise: hand weight, crunches, some walking    Family History  Problem Relation Age of Onset  . Osteoarthritis Mother   . Skin cancer Mother     ? melanoma  . Heart failure Mother   . CVA Mother 80  . Esophageal cancer Father 28  . Heart attack Father 67    smoker  . CVA Paternal Grandmother   . Diabetes Paternal Grandmother     prediabetic  . Deep vein thrombosis Paternal Grandmother   . Heart attack Maternal Uncle 59  . Cancer Maternal Grandmother     ? GI  . Asthma Son     EIB    Review of Systems  Constitutional: Negative for appetite change, chills, fatigue, fever and unexpected weight change.  HENT: Negative for hearing loss.   Eyes: Negative for visual disturbance.  Respiratory: Negative for cough, shortness of breath  and wheezing.   Cardiovascular: Positive for chest pain (rare). Negative for palpitations and leg swelling.  Gastrointestinal: Negative for abdominal pain, blood in stool, constipation, diarrhea and nausea.  Genitourinary: Negative for dysuria and hematuria.  Musculoskeletal: Positive for arthralgias and back pain.  Skin: Negative for color change and rash.  Neurological: Negative for dizziness, light-headedness and headaches.  Psychiatric/Behavioral: Negative for dysphoric mood. The patient is not nervous/anxious.        Objective:   Vitals:   08/08/16 0908  BP: 132/72  Pulse: 69  Resp: 16  Temp: 97.9 F (36.6 C)   Filed Weights   08/08/16 0908  Weight: 140 lb (63.5 kg)   Body mass index is 24.03 kg/m.  Wt Readings from Last 3 Encounters:  08/08/16 140 lb (63.5 kg)  11/24/15 141 lb (64 kg)  08/07/15 141 lb (64 kg)     Physical Exam Constitutional: She appears well-developed and well-nourished. No distress.  HENT:  Head: Normocephalic and atraumatic.  Right Ear: External ear normal. Normal ear canal and TM Left Ear: External ear normal.  Normal ear canal and TM Mouth/Throat: Oropharynx is clear and moist.  Eyes: Conjunctivae and EOM are normal.  Neck: Neck supple. No tracheal deviation present. No thyromegaly present.  No carotid bruit  Cardiovascular: Normal rate, regular rhythm and normal heart sounds.   No murmur heard.  No edema. Pulmonary/Chest: Effort normal and breath sounds normal. No respiratory distress. She has no wheezes. She has no rales.  Breast: deferred to Gyn Abdominal: Soft. She exhibits no distension. There is no tenderness.  Lymphadenopathy: She has no cervical adenopathy.  Skin: Skin is warm and dry. She is not diaphoretic.  Psychiatric: She has a normal mood and affect. Her behavior is normal.         Assessment & Plan:   Wellness Exam: Immunizations  Discussed shingrix, others up to date Colonoscopy  Last done 2012 - no longer needed  due to age 40  No longer needed due to age Dexa - up to date per patient - managed by gyn Gyn - Up to date  - Dr Radene Knee Eye exam  Up to date  Hearing loss  no Memory concerns/difficulties  none Independent of ADLs  fully Stressed the importance of regular exercise   Patient received copy of preventative screening tests/immunizations recommended for the next 5-10 years.   Physical exam: Screening blood work  ordered Immunizations  Discussed shingrix, others up to date Colonoscopy  Last done 2012 - no longer needed due to age 42  No longer needed due to age 31   Up to date  Dexa - osteopenia - monitored by Dr Radene Knee Eye exams  Up to date  Exercise -  Regularly - discussed increasing walking Weight  Normal BMI Skin   No conerns Substance abuse   none  See Problem List for Assessment and Plan of chronic medical problems.  FU annually wellness/CPE

## 2016-08-07 NOTE — Patient Instructions (Addendum)
[name] , Thank you for taking time to come for your Medicare Wellness Visit. I appreciate your ongoing commitment to your health goals. Please review the following plan we discussed and let me know if I can assist you in the future.   These are the goals we discussed: Goals    None      This is a list of the screening recommended for you and due dates:  Health Maintenance  Topic Date Due  . DEXA scan (bone density measurement)  01/26/2007  . Flu Shot  11/02/2016  . Tetanus Vaccine  05/24/2020  . Colon Cancer Screening  06/15/2020  . Pneumonia vaccines  Completed     Test(s) ordered today. Your results will be released to [place] (or called to you) after review, usually within 72hours after test completion. If any changes need to be made, you will be notified at that same time.  All other Health Maintenance issues reviewed.   All recommended immunizations and age-appropriate screenings are up-to-date or discussed.  No immunizations administered today.   Medications reviewed and updated.   No changes recommended at this time.    Please followup in one year   Health Maintenance, Female Adopting a healthy lifestyle and getting preventive care can go a long way to promote health and wellness. Talk with your health care provider about what schedule of regular examinations is right for you. This is a good chance for you to check in with your provider about disease prevention and staying healthy. In between checkups, there are plenty of things you can do on your own. Experts have done a lot of research about which lifestyle changes and preventive measures are most likely to keep you healthy. Ask your health care provider for more information. Weight and diet Eat a healthy diet  Be sure to include plenty of vegetables, fruits, low-fat dairy products, and lean protein.  Do not eat a lot of foods high in solid fats, added sugars, or salt.  Get regular exercise. This is one of the  most important things you can do for your health.  Most adults should exercise for at least 150 minutes each week. The exercise should increase your heart rate and make you sweat (moderate-intensity exercise).  Most adults should also do strengthening exercises at least twice a week. This is in addition to the moderate-intensity exercise. Maintain a healthy weight  Body mass index (BMI) is a measurement that can be used to identify possible weight problems. It estimates body fat based on height and weight. Your health care provider can help determine your BMI and help you achieve or maintain a healthy weight.  For females 28 years of age and older:  A BMI below 18.5 is considered underweight.  A BMI of 18.5 to 24.9 is normal.  A BMI of 25 to 29.9 is considered overweight.  A BMI of 30 and above is considered obese. Watch levels of cholesterol and blood lipids  You should start having your blood tested for lipids and cholesterol at 75 years of age, then have this test every 5 years.  You may need to have your cholesterol levels checked more often if:  Your lipid or cholesterol levels are high.  You are older than 75 years of age.  You are at high risk for heart disease. Cancer screening Lung Cancer  Lung cancer screening is recommended for adults 4-12 years old who are at high risk for lung cancer because of a history of smoking.  A yearly low-dose CT scan of the lungs is recommended for people who:  Currently smoke.  Have quit within the past 15 years.  Have at least a 30-pack-year history of smoking. A pack year is smoking an average of one pack of cigarettes a day for 1 year.  Yearly screening should continue until it has been 15 years since you quit.  Yearly screening should stop if you develop a health problem that would prevent you from having lung cancer treatment. Breast Cancer  Practice breast self-awareness. This means understanding how your breasts normally  appear and feel.  It also means doing regular breast self-exams. Let your health care provider know about any changes, no matter how small.  If you are in your 20s or 30s, you should have a clinical breast exam (CBE) by a health care provider every 1-3 years as part of a regular health exam.  If you are 4 or older, have a CBE every year. Also consider having a breast X-ray (mammogram) every year.  If you have a family history of breast cancer, talk to your health care provider about genetic screening.  If you are at high risk for breast cancer, talk to your health care provider about having an MRI and a mammogram every year.  Breast cancer gene (BRCA) assessment is recommended for women who have family members with BRCA-related cancers. BRCA-related cancers include:  Breast.  Ovarian.  Tubal.  Peritoneal cancers.  Results of the assessment will determine the need for genetic counseling and BRCA1 and BRCA2 testing. Cervical Cancer  Your health care provider may recommend that you be screened regularly for cancer of the pelvic organs (ovaries, uterus, and vagina). This screening involves a pelvic examination, including checking for microscopic changes to the surface of your cervix (Pap test). You may be encouraged to have this screening done every 3 years, beginning at age 51.  For women ages 31-65, health care providers may recommend pelvic exams and Pap testing every 3 years, or they may recommend the Pap and pelvic exam, combined with testing for human papilloma virus (HPV), every 5 years. Some types of HPV increase your risk of cervical cancer. Testing for HPV may also be done on women of any age with unclear Pap test results.  Other health care providers may not recommend any screening for nonpregnant women who are considered low risk for pelvic cancer and who do not have symptoms. Ask your health care provider if a screening pelvic exam is right for you.  If you have had past  treatment for cervical cancer or a condition that could lead to cancer, you need Pap tests and screening for cancer for at least 20 years after your treatment. If Pap tests have been discontinued, your risk factors (such as having a new sexual partner) need to be reassessed to determine if screening should resume. Some women have medical problems that increase the chance of getting cervical cancer. In these cases, your health care provider may recommend more frequent screening and Pap tests. Colorectal Cancer  This type of cancer can be detected and often prevented.  Routine colorectal cancer screening usually begins at 75 years of age and continues through 75 years of age.  Your health care provider may recommend screening at an earlier age if you have risk factors for colon cancer.  Your health care provider may also recommend using home test kits to check for hidden blood in the stool.  A small camera at the end of a  tube can be used to examine your colon directly (sigmoidoscopy or colonoscopy). This is done to check for the earliest forms of colorectal cancer.  Routine screening usually begins at age 54.  Direct examination of the colon should be repeated every 5-10 years through 75 years of age. However, you may need to be screened more often if early forms of precancerous polyps or small growths are found. Skin Cancer  Check your skin from head to toe regularly.  Tell your health care provider about any new moles or changes in moles, especially if there is a change in a mole's shape or color.  Also tell your health care provider if you have a mole that is larger than the size of a pencil eraser.  Always use sunscreen. Apply sunscreen liberally and repeatedly throughout the day.  Protect yourself by wearing long sleeves, pants, a wide-brimmed hat, and sunglasses whenever you are outside. Heart disease, diabetes, and high blood pressure  High blood pressure causes heart disease and  increases the risk of stroke. High blood pressure is more likely to develop in:  People who have blood pressure in the high end of the normal range (130-139/85-89 mm Hg).  People who are overweight or obese.  People who are African American.  If you are 84-60 years of age, have your blood pressure checked every 3-5 years. If you are 67 years of age or older, have your blood pressure checked every year. You should have your blood pressure measured twice-once when you are at a hospital or clinic, and once when you are not at a hospital or clinic. Record the average of the two measurements. To check your blood pressure when you are not at a hospital or clinic, you can use:  An automated blood pressure machine at a pharmacy.  A home blood pressure monitor.  If you are between 6 years and 62 years old, ask your health care provider if you should take aspirin to prevent strokes.  Have regular diabetes screenings. This involves taking a blood sample to check your fasting blood sugar level.  If you are at a normal weight and have a low risk for diabetes, have this test once every three years after 75 years of age.  If you are overweight and have a high risk for diabetes, consider being tested at a younger age or more often. Preventing infection Hepatitis B  If you have a higher risk for hepatitis B, you should be screened for this virus. You are considered at high risk for hepatitis B if:  You were born in a country where hepatitis B is common. Ask your health care provider which countries are considered high risk.  Your parents were born in a high-risk country, and you have not been immunized against hepatitis B (hepatitis B vaccine).  You have HIV or AIDS.  You use needles to inject street drugs.  You live with someone who has hepatitis B.  You have had sex with someone who has hepatitis B.  You get hemodialysis treatment.  You take certain medicines for conditions, including  cancer, organ transplantation, and autoimmune conditions. Hepatitis C  Blood testing is recommended for:  Everyone born from [birth year] through 1964.  Anyone with known risk factors for hepatitis C. Sexually transmitted infections (STIs)  You should be screened for sexually transmitted infections (STIs) including gonorrhea and chlamydia if:  You are sexually active and are younger than 75 years of age.  You are older than 75 years of age  and your health care provider tells you that you are at risk for this type of infection.  Your sexual activity has changed since you were last screened and you are at an increased risk for chlamydia or gonorrhea. Ask your health care provider if you are at risk.  If you do not have HIV, but are at risk, it may be recommended that you take a prescription medicine daily to prevent HIV infection. This is called pre-exposure prophylaxis (PrEP). You are considered at risk if:  You are sexually active and do not regularly use condoms or know the HIV status of your partner(s).  You take drugs by injection.  You are sexually active with a partner who has HIV. Talk with your health care provider about whether you are at high risk of being infected with HIV. If you choose to begin PrEP, you should first be tested for HIV. You should then be tested every 3 months for as long as you are taking PrEP. Pregnancy  If you are premenopausal and you may become pregnant, ask your health care provider about preconception counseling.  If you may become pregnant, take 400 to 800 micrograms (mcg) of folic acid every day.  If you want to prevent pregnancy, talk to your health care provider about birth control (contraception). Osteoporosis and menopause  Osteoporosis is a disease in which the bones lose minerals and strength with aging. This can result in serious bone fractures. Your risk for osteoporosis can be identified using a bone density scan.  If you are 47 years of age  or older, or if you are at risk for osteoporosis and fractures, ask your health care provider if you should be screened.  Ask your health care provider whether you should take a calcium or vitamin D supplement to lower your risk for osteoporosis.  Menopause may have certain physical symptoms and risks.  Hormone replacement therapy may reduce some of these symptoms and risks. Talk to your health care provider about whether hormone replacement therapy is right for you. Follow these instructions at home:  Schedule regular health, dental, and eye exams.  Stay current with your immunizations.  Do not use any tobacco products including cigarettes, chewing tobacco, or electronic cigarettes.  If you are pregnant, do not drink alcohol.  If you are breastfeeding, limit how much and how often you drink alcohol.  Limit alcohol intake to no more than 1 drink per day for nonpregnant women. One drink equals 12 ounces of beer, 5 ounces of wine, or 1 ounces of hard liquor.  Do not use street drugs.  Do not share needles.  Ask your health care provider for help if you need support or information about quitting drugs.  Tell your health care provider if you often feel depressed.  Tell your health care provider if you have ever been abused or do not feel safe at home. This information is not intended to replace advice given to you by your health care provider. Make sure you discuss any questions you have with your health care provider. Document Released: 10/04/2010 Document Revised: 08/27/2015 Document Reviewed: 12/23/2014 Elsevier Interactive Patient Education  2017 [employer].

## 2016-08-08 ENCOUNTER — Other Ambulatory Visit (INDEPENDENT_AMBULATORY_CARE_PROVIDER_SITE_OTHER): Payer: PPO

## 2016-08-08 ENCOUNTER — Encounter: Payer: Self-pay | Admitting: Internal Medicine

## 2016-08-08 ENCOUNTER — Ambulatory Visit (INDEPENDENT_AMBULATORY_CARE_PROVIDER_SITE_OTHER): Payer: PPO | Admitting: Internal Medicine

## 2016-08-08 VITALS — BP 132/72 | HR 69 | Temp 97.9°F | Resp 16 | Ht 64.0 in | Wt 140.0 lb

## 2016-08-08 DIAGNOSIS — E782 Mixed hyperlipidemia: Secondary | ICD-10-CM

## 2016-08-08 DIAGNOSIS — Z0000 Encounter for general adult medical examination without abnormal findings: Secondary | ICD-10-CM | POA: Diagnosis not present

## 2016-08-08 DIAGNOSIS — M8580 Other specified disorders of bone density and structure, unspecified site: Secondary | ICD-10-CM

## 2016-08-08 DIAGNOSIS — M48061 Spinal stenosis, lumbar region without neurogenic claudication: Secondary | ICD-10-CM | POA: Insufficient documentation

## 2016-08-08 DIAGNOSIS — I2510 Atherosclerotic heart disease of native coronary artery without angina pectoris: Secondary | ICD-10-CM

## 2016-08-08 DIAGNOSIS — M419 Scoliosis, unspecified: Secondary | ICD-10-CM | POA: Insufficient documentation

## 2016-08-08 DIAGNOSIS — K219 Gastro-esophageal reflux disease without esophagitis: Secondary | ICD-10-CM

## 2016-08-08 DIAGNOSIS — C4491 Basal cell carcinoma of skin, unspecified: Secondary | ICD-10-CM

## 2016-08-08 DIAGNOSIS — I8390 Asymptomatic varicose veins of unspecified lower extremity: Secondary | ICD-10-CM | POA: Insufficient documentation

## 2016-08-08 LAB — COMPREHENSIVE METABOLIC PANEL
ALBUMIN: 4.6 g/dL (ref 3.5–5.2)
ALK PHOS: 53 U/L (ref 39–117)
ALT: 8 U/L (ref 0–35)
AST: 20 U/L (ref 0–37)
BILIRUBIN TOTAL: 0.5 mg/dL (ref 0.2–1.2)
BUN: 13 mg/dL (ref 6–23)
CALCIUM: 9.5 mg/dL (ref 8.4–10.5)
CO2: 26 mEq/L (ref 19–32)
Chloride: 106 mEq/L (ref 96–112)
Creatinine, Ser: 0.89 mg/dL (ref 0.40–1.20)
GFR: 65.71 mL/min (ref >60.00)
GLUCOSE: 97 mg/dL (ref 70–99)
POTASSIUM: 4.6 meq/L (ref 3.5–5.1)
Sodium: 139 mEq/L (ref 135–145)
TOTAL PROTEIN: 7.2 g/dL (ref 6.0–8.3)

## 2016-08-08 LAB — CBC WITH DIFFERENTIAL/PLATELET
BASOS ABS: 0 10*3/uL (ref 0.0–0.1)
Basophils Relative: 0.3 % (ref 0.0–3.0)
EOS PCT: 3.4 % (ref 0.0–5.0)
Eosinophils Absolute: 0.2 10*3/uL (ref 0.0–0.7)
HEMATOCRIT: 40.1 % (ref 36.0–46.0)
Hemoglobin: 13.3 g/dL (ref 12.0–15.0)
LYMPHS ABS: 1.4 10*3/uL (ref 0.7–4.0)
LYMPHS PCT: 24.7 % (ref 12.0–46.0)
MCHC: 33.1 g/dL (ref 30.0–36.0)
MCV: 90.7 fl (ref 78.0–100.0)
MONOS PCT: 8.3 % (ref 3.0–12.0)
Monocytes Absolute: 0.5 10*3/uL (ref 0.1–1.0)
NEUTROS PCT: 63.3 % (ref 43.0–77.0)
Neutro Abs: 3.6 10*3/uL (ref 1.4–7.7)
Platelets: 243 10*3/uL (ref 150.0–400.0)
RBC: 4.42 Mil/uL (ref 3.87–5.11)
RDW: 13.3 % (ref 11.5–15.5)
WBC: 5.7 10*3/uL (ref 4.0–10.5)

## 2016-08-08 LAB — LIPID PANEL
CHOLESTEROL: 149 mg/dL (ref 0–200)
HDL: 55.4 mg/dL (ref >39.00)
LDL Cholesterol: 71 mg/dL (ref 0–99)
NONHDL: 93.72
TRIGLYCERIDES: 113 mg/dL (ref 0.0–149.0)
Total CHOL/HDL Ratio: 3
VLDL: 22.6 mg/dL (ref 0.0–40.0)

## 2016-08-08 LAB — TSH: TSH: 1.66 u[IU]/mL (ref 0.35–4.50)

## 2016-08-08 NOTE — Assessment & Plan Note (Signed)
Monitored by Gyn  - Dr Radene Knee Increase walking Continue calcium, vitamin d

## 2016-08-08 NOTE — Assessment & Plan Note (Signed)
Check lipid panel  Continue daily statin Regular exercise and healthy diet encouraged  

## 2016-08-08 NOTE — Assessment & Plan Note (Signed)
GERD controlled Continue daily medication - zantac daily

## 2016-08-08 NOTE — Assessment & Plan Note (Signed)
Has rare chest pain, has never taken ntg Following with cardio Continue asa 81 mg

## 2016-08-08 NOTE — Assessment & Plan Note (Signed)
Sees derm annually 

## 2016-08-08 NOTE — Progress Notes (Signed)
Pre visit review using our clinic review tool, if applicable. No additional management support is needed unless otherwise documented below in the visit note. 

## 2016-08-31 ENCOUNTER — Telehealth: Payer: Self-pay | Admitting: Internal Medicine

## 2016-08-31 NOTE — Telephone Encounter (Signed)
Pt would like to know if she can bring in a urine specimen so she can ecieve some medication for her UTI.

## 2016-08-31 NOTE — Telephone Encounter (Signed)
Please advise, we have openings tomorrow.

## 2016-08-31 NOTE — Telephone Encounter (Signed)
Should ideally be seen tomorrow.  If symptoms are severe ok to bring in urine for UA, UCx, but still may not get results until tomorrow am

## 2016-08-31 NOTE — Telephone Encounter (Signed)
Appt scheduled for tomorrow.  °

## 2016-09-01 ENCOUNTER — Other Ambulatory Visit: Payer: PPO

## 2016-09-01 ENCOUNTER — Encounter: Payer: Self-pay | Admitting: Internal Medicine

## 2016-09-01 ENCOUNTER — Ambulatory Visit (INDEPENDENT_AMBULATORY_CARE_PROVIDER_SITE_OTHER): Payer: PPO | Admitting: Internal Medicine

## 2016-09-01 VITALS — BP 124/66 | HR 75 | Temp 98.9°F | Resp 16 | Wt 143.0 lb

## 2016-09-01 DIAGNOSIS — N3000 Acute cystitis without hematuria: Secondary | ICD-10-CM | POA: Insufficient documentation

## 2016-09-01 DIAGNOSIS — R300 Dysuria: Secondary | ICD-10-CM | POA: Diagnosis not present

## 2016-09-01 LAB — POCT URINALYSIS DIPSTICK
Ketones, UA: NEGATIVE
Nitrite, UA: POSITIVE
PH UA: 6 (ref 5.0–8.0)
Protein, UA: 16
RBC UA: NEGATIVE
SPEC GRAV UA: 1.02 (ref 1.010–1.025)
Urobilinogen, UA: 2 E.U./dL — AB

## 2016-09-01 MED ORDER — SULFAMETHOXAZOLE-TRIMETHOPRIM 800-160 MG PO TABS: 1.0000 | ORAL_TABLET | Freq: Two times a day (BID) | ORAL | 0 refills | Status: DC

## 2016-09-01 NOTE — Progress Notes (Signed)
Subjective:    Patient ID: [name], female    DOB: 1941-02-12, 76 y.o.   MRN: [id number]  HPI She is here for an acute visit for a possible UTI.   Her symptoms started Tuesday evening, two nights ago.  She has dysuria, cloudy urine, urinary frequency.  She started taking urostat Tuesday evening and it has helped.  She denies hematuria.  She has mild abdominal pain and nausea.  She denies back pain.  She feels feverish at times.   Her last uti was last fall.  She has had more uti's in the past few years.   Medications and allergies reviewed with patient and updated if appropriate.  Patient Active Problem List   Diagnosis Date Noted  . Scoliosis 08/08/2016  . Lumbar stenosis 08/08/2016  . Varicose vein of leg 08/08/2016  . Osteoarthritis, hand 08/07/2015  . Left lumbar radiculopathy 07/03/2014  . Bursitis of left hip 02/21/2013  . Sacroiliac dysfunction 02/21/2013  . Malignant basal cell neoplasm of skin 06/25/2012  . GERD (gastroesophageal reflux disease) 05/30/2011  . CAD, NATIVE VESSEL 06/17/2010  . ABNORMAL STRESS ELECTROCARDIOGRAM 06/01/2010  . ARTHRALGIA 05/21/2009  . Diverticulosis of large intestine 05/19/2008  . HYPERLIPIDEMIA 05/15/2007  . Osteopenia 05/10/2007    Current Outpatient Prescriptions on File Prior to Visit  Medication Sig Dispense Refill  . aspirin 81 MG tablet Take 81 mg by mouth daily.      . Black Currant Seed Oil 500 MG CAPS Take by mouth daily.     . Calcium Citrate-Vitamin D (CITRUS CALCIUM 1500 + D PO) Take by mouth daily.     . Coenzyme Q10 (COQ10) 200 MG CAPS Take by mouth daily.    . Gelatin 600 MG CAPS Take by mouth daily.    . Multiple Vitamin (MULTIVITAMIN) capsule Take 1 capsule by mouth daily.      . nitroGLYCERIN (NITROSTAT) 0.4 MG SL tablet Place 0.4 mg under the tongue every 5 (five) minutes as needed.      Marland Kitchen oxyCODONE-acetaminophen (PERCOCET) 10-325 MG tablet Take 1 tablet by mouth every 4 (four) hours as needed for pain.      . ranitidine (ZANTAC) 150 MG tablet TAKE 1 TABLET (150 MG TOTAL) BY MOUTH DAILY. 90 tablet 0  . rosuvastatin (CRESTOR) 10 MG tablet TAKE 1 TABLET BY MOUTH AT BEDTIME 90 tablet 2   No current facility-administered medications on file prior to visit.     Past Medical History:  Diagnosis Date  . CAD (coronary artery disease)    Dr Aundra Dubin  . Diverticulosis of colon   . Herpes zoster    1997 L flank  . Hyperlipidemia    intol. to statins in past  . Osteopenia    Dr Radene Knee, Gynecology    Past Surgical History:  Procedure Laterality Date  . CATARACT EXTRACTION  2010   OD  , Dr. Herbert Deaner  . CATARACT EXTRACTION W/ INTRAOCULAR LENS IMPLANT Left 02/2014   Vision not significantly improved  . COLONOSCOPY  2007   [facility]  . CORONARY ANGIOPLASTY WITH STENT PLACEMENT  06/19/2010   2 vessel; Dr Burt Knack  . [place]    Social History   Social History  . Marital status: Married    Spouse name: N/A  . Number of children: N/A  . Years of education: N/A   Social History Main Topics  . Smoking status: Never Smoker  . Smokeless tobacco: Never Used  . Alcohol use Yes  Comment: extremely rarely  . Drug use: No  . Sexual activity: Not on file   Other Topics Concern  . Not on file   Social History Narrative   Exercise: hand weight, crunches, some walking    Family History  Problem Relation Age of Onset  . Osteoarthritis Mother   . Skin cancer Mother        ? melanoma  . Heart failure Mother   . CVA Mother 55  . Esophageal cancer Father 21  . Heart attack Father 55       smoker  . CVA Paternal Grandmother   . Diabetes Paternal Grandmother        prediabetic  . Deep vein thrombosis Paternal Grandmother   . Heart attack Maternal Uncle 59  . Cancer Maternal Grandmother        ? GI  . Asthma Son        EIB    Review of Systems  Constitutional: Positive for fever (subjective).  Gastrointestinal: Positive for abdominal pain (mild) and nausea.   Genitourinary: Positive for dysuria and frequency. Negative for hematuria.  Neurological: Negative for light-headedness and headaches.       Objective:   Vitals:   09/01/16 1013  BP: 124/66  Pulse: 75  Resp: 16  Temp: 98.9 F (37.2 C)   Filed Weights   09/01/16 1013  Weight: 143 lb (64.9 kg)   Body mass index is 24.55 kg/m.  Wt Readings from Last 3 Encounters:  09/01/16 143 lb (64.9 kg)  08/08/16 140 lb (63.5 kg)  11/24/15 141 lb (64 kg)     Physical Exam  Constitutional: She appears well-developed and well-nourished. No distress.  Abdominal: Soft. She exhibits no mass. There is tenderness (minimal suprapubic ). There is no rebound and no guarding.  Genitourinary:  Genitourinary Comments: No cva tenderness  Musculoskeletal: She exhibits edema.  Skin: Skin is warm and dry. She is not diaphoretic.          Assessment & Plan:     See Problem List for Assessment and Plan of chronic medical problems.

## 2016-09-01 NOTE — Patient Instructions (Signed)

## 2016-09-01 NOTE — Assessment & Plan Note (Signed)
Urine dip with evidence of UTI Will send for culture Start bactrim  Increase fluids Advised her to discuss with gyn - ? May benefit from vaginal estrogen cream

## 2016-09-03 LAB — URINE CULTURE

## 2016-10-05 ENCOUNTER — Other Ambulatory Visit: Payer: Self-pay | Admitting: Internal Medicine

## 2016-10-12 DIAGNOSIS — L820 Inflamed seborrheic keratosis: Secondary | ICD-10-CM | POA: Diagnosis not present

## 2016-10-12 DIAGNOSIS — D485 Neoplasm of uncertain behavior of skin: Secondary | ICD-10-CM | POA: Diagnosis not present

## 2016-10-15 ENCOUNTER — Other Ambulatory Visit: Payer: Self-pay | Admitting: Cardiology

## 2016-10-17 ENCOUNTER — Telehealth: Payer: Self-pay | Admitting: Cardiovascular Disease

## 2016-10-17 MED ORDER — ROSUVASTATIN CALCIUM 10 MG PO TABS: 10.0000 mg | ORAL_TABLET | Freq: Every day | ORAL | 4 refills | Status: DC

## 2016-10-17 NOTE — Telephone Encounter (Signed)
New message     *STAT* If patient is at the pharmacy, call can be transferred to refill team.   1. Which medications need to be refilled? (please list name of each medication and dose if known) rosuvastatin 10 mg   2. Which pharmacy/location (including street and city if local pharmacy) is medication to be sent to? [facility] on [address]   3. Do they need a 30 day or 90 day supply? [place]

## 2017-01-04 ENCOUNTER — Encounter: Payer: Self-pay | Admitting: Cardiovascular Disease

## 2017-01-04 ENCOUNTER — Ambulatory Visit (INDEPENDENT_AMBULATORY_CARE_PROVIDER_SITE_OTHER): Payer: PPO | Admitting: Cardiovascular Disease

## 2017-01-04 VITALS — BP 106/66 | HR 72 | Ht 64.5 in | Wt 141.0 lb

## 2017-01-04 DIAGNOSIS — I2510 Atherosclerotic heart disease of native coronary artery without angina pectoris: Secondary | ICD-10-CM

## 2017-01-04 NOTE — Progress Notes (Signed)
Cardiology Office Note Date:  01/04/2017   ID:  [name], DOB 1940-04-07, MRN [id number]  PCP:  Binnie Rail, MD  Cardiologist:  Sherren Mocha, MD    Chief Complaint  Patient presents with  . Follow-up     History of Present Illness: [name] is a 76 y.o. female who presents for follow-up of CAD. She has previously been followed by Dr Aundra Dubin and is here to establish with me after his move to the [facility].   The patient underwent 2 vessel PCI in 2012 with treatment of a chronic occlusion in the RCA and severely stenotic LAD using drug-eluting stents in each vessel. She presented with exertional dyspnea and was found to have an abnormal stress ECG with diffuse ST depression leading to cardiac cath.   The patient is here alone today. She is doing well. Today, she denies symptoms of palpitations, chest pain, shortness of breath, orthopnea, PND, lower extremity edema, dizziness, or syncope. She walks one mile every day without exertional dyspnea or other complaints.   Past Medical History:  Diagnosis Date  . CAD (coronary artery disease)    Dr Aundra Dubin  . Diverticulosis of colon   . Herpes zoster    1997 L flank  . Hyperlipidemia    intol. to statins in past  . Osteopenia    Dr Radene Knee, Gynecology    Past Surgical History:  Procedure Laterality Date  . CATARACT EXTRACTION  2010   OD  , Dr. Herbert Deaner  . CATARACT EXTRACTION W/ INTRAOCULAR LENS IMPLANT Left 02/2014   Vision not significantly improved  . COLONOSCOPY  2007   [facility]  . CORONARY ANGIOPLASTY WITH STENT PLACEMENT  06/19/2010   2 vessel; Dr Burt Knack  . VARICOSE VEIN SURGERY  1971    Current Outpatient Prescriptions  Medication Sig Dispense Refill  . aspirin 81 MG tablet Take 81 mg by mouth daily.      . Black Currant Seed Oil 500 MG CAPS Take 1 capsule by mouth daily.     . Calcium Citrate-Vitamin D (CITRUS CALCIUM 1500 + D PO) Take 1 capsule by mouth daily.     . Coenzyme Q10  (COQ10) 200 MG CAPS Take 1 capsule by mouth daily.     . Gelatin 600 MG CAPS Take 1 capsule by mouth daily.     . Multiple Vitamin (MULTIVITAMIN) capsule Take 1 capsule by mouth daily.      . nitroGLYCERIN (NITROSTAT) 0.4 MG SL tablet Place 0.4 mg under the tongue every 5 (five) minutes as needed for chest pain.     Marland Kitchen oxyCODONE-acetaminophen (PERCOCET) 10-325 MG tablet Take 1 tablet by mouth every 4 (four) hours as needed for pain.    . ranitidine (ZANTAC) 150 MG tablet TAKE 1 TABLET (150 MG TOTAL) BY MOUTH DAILY. 90 tablet 1  . rosuvastatin (CRESTOR) 10 MG tablet Take 1 tablet (10 mg total) by mouth at bedtime. 30 tablet 4   No current facility-administered medications for this visit.     Allergies:   Lisinopril; Pravastatin sodium; and Metoprolol   Social History:  The patient  reports that she has never smoked. She has never used smokeless tobacco. She reports that she drinks alcohol. She reports that she does not use drugs.   Family History:  The patient's family history includes Asthma in her son; CVA in her paternal grandmother; CVA (age of onset: 69) in her mother; Cancer in her maternal grandmother; Deep vein thrombosis in  her paternal grandmother; Diabetes in her paternal grandmother; Esophageal cancer (age of onset: 22) in her father; Heart attack (age of onset: 31) in her maternal uncle; Heart attack (age of onset: 46) in her father; Heart failure in her mother; Osteoarthritis in her mother; Skin cancer in her mother.    ROS:  Please see the history of present illness.  Otherwise, review of systems is positive for back pain.  All other systems are reviewed and negative.    PHYSICAL EXAM: VS:  BP 106/66   Pulse 72   Ht 5' 4.5" (1.638 m)   Wt 64 kg (141 lb)   BMI 23.83 kg/m  , BMI Body mass index is 23.83 kg/m. GEN: Well nourished, well developed, in no acute distress  HEENT: normal  Neck: no JVD, no masses. No carotid bruits Cardiac: RRR without murmur or gallop                 Respiratory:  clear to auscultation bilaterally, normal work of breathing GI: soft, nontender, nondistended, + BS MS: no deformity or atrophy  Ext: no pretibial edema, pedal pulses 2+= bilaterally Skin: warm and dry, no rash Neuro:  Strength and sensation are intact Psych: euthymic mood, full affect  EKG:  EKG is ordered today. The ekg ordered today shows NSR 72 bpm, low voltage QRS  Recent Labs: 08/08/2016: ALT 8; BUN 13; Creatinine, Ser 0.89; Hemoglobin 13.3; Platelets 243.0; Potassium 4.6; Sodium 139; TSH 1.66   Lipid Panel     Component Value Date/Time   CHOL 149 08/08/2016 0942   TRIG 113.0 08/08/2016 0942   TRIG 75 02/08/2006 1223   HDL 55.40 08/08/2016 0942   CHOLHDL 3 08/08/2016 0942   VLDL 22.6 08/08/2016 0942   LDLCALC 71 08/08/2016 0942   LDLDIRECT 158.8 05/24/2010 1013      Wt Readings from Last 3 Encounters:  01/04/17 64 kg (141 lb)  09/01/16 64.9 kg (143 lb)  08/08/16 63.5 kg (140 lb)     Cardiac Studies Reviewed: Cardiac Cath 2012: PROCEDURES: 1. Left heart catheterization. 2. Coronary angiography. 3. Left ventriculography.  OPERATOR:  [name], MD  INDICATIONS:  This is a 76 year old who has a history of hyperlipidemia. For the last couple of months, she has noted decreased stamina.  She gets short of breath [place].  No rest symptoms.  No true chest pain.  The patient did have an exercise treadmill test done.  This was abnormal showing ST depression in multiple leads as well as a hypertensive blood pressure response.  The patient is brought in today for left heart catheterization today to define her coronary disease.  PROCEDURE TECHNIQUE:  After the patient gave informed consent, the right groin was sterilely prepped and draped.  Lidocaine 1% was used to locally anesthetize the right groin area.  The right common femoral artery was entered using modified Seldinger technique and a 4-French arterial sheath was  placed.  The right coronary artery was engaged using the [facility] catheter.  The left coronary artery was engaged with the JL-4 catheter and was engaged better with JL-5 catheter.  The left ventricle was entered using an angled pigtail catheter.  There were no complications.  HEMODYNAMIC FINDINGS: 1. LV 133/14, aorta 137/62. 2. Left ventriculography:  EF was estimated to be 60%.  The basal     inferior wall was severely hypokinetic to akinetic.  The remainder     of the LV myocardium retracted normally. 3. Right coronary  artery:  There was subtotal occlusion of the proximal     right coronary artery.  There were good left-to-right collaterals     that filled the PDA, PLV, and distal RCA.  These collaterals came     from the circumflex and the LAD systems. 4. Left main:  There is no angiographic disease in the left main. 5. Left circumflex system:  There was a large first obtuse marginal,     small AV circumflex, and minimal disease. 6. LAD system:  There was a small first diagonal.  Shortly after that,     there was a moderate-sized second diagonal.  At     the takeoff of the second diagonal, there is about an 80% stenosis.     This was discrete.  There was also about an 80% ostial stenosis in     the moderate-sized second diagonal.  The remainder of the LAD had     minimal disease.  IMPRESSION:  This is a 76 year old with history hyperlipidemia as well as exertional dyspnea who had an abnormal exercise treadmill test.  Left heart catheterization showed subtotal occlusion of the right coronary artery with good left-to-right collaterals.  There was also an 80% proximal left anterior descending stenosis at the takeoff of the moderate-sized second diagonal as well as an 80% ostial second diagonal stenosis.  I have discussed the patient's situation with our interventional team.  She has anatomy that would be suitable for CABG.  However, intervention is also an option. I think that if we were  to choose to intervene, she would need a drug-eluting  stent in the LAD but also an attempt at opening the chronic total occlusion in the RCA.  To open the LAD alone would leave a large territory at risk if the LAD stent were ever to be compromised.  I will have the patient back to the office and we will  discuss PCI versus CABG.   PCI 2012: PROCEDURES: 1. Percutaneous transluminal coronary angioplasty and stenting of the     right coronary artery. 2. Percutaneous transluminal coronary angioplasty and stenting of the     left anterior descending artery. 3. Perclose of the right femoral artery.  PROCEDURAL INDICATIONS:  Ms. Eischeid is a 76 year old woman with exertional dyspnea and class III symptoms.  There is concern about this being her anginal equivalent.  She had abnormal stress test and underwent a cardiac catheterization by Dr. Aundra Dubin.  This demonstrated a chronic total occlusion of the right coronary artery and severe stenosis of mid LAD involving the LAD diagonal bifurcation.  We reviewed her films, discussed PCI versus consideration of coronary artery bypass with the patient and her family in the office and reviewed her revascularization options.  In the setting of preserved left ventricular function and the absence of diabetes, we elected to proceed with PCI.  Risks, indication, and alternatives to percutaneous intervention were reviewed with the patient.  Informed consent was obtained.  The right groin was prepped, draped and anesthetized with 1% lidocaine.  Using the modified Seldinger technique, a 7-French sheath was placed in the right femoral artery.  A 7-French JR-4 guide catheter was used for the right coronary artery intervention.  Bivalirudin was used for anticoagulation. The patient had been preloaded with Plavix.  A 3 g [employer] guidewire was used to cross the lesion.  The lesion was crossed with a moderate amount of difficulty, but this was successful.   The wire moved freely and I was comfortable that  the wire was intraluminal.  We did advance a 1.5 x 20-mm mini Trek over the wire balloon across the lesion and attempted to do a distal balloon injection to confirm intraluminal position, but I was unable to inject due to the small lumen of the balloon.  The Miracle [employer] wire was changed out for a Cougar guidewire.  The lesion was dilated with a 1.5 balloon to 14 atmospheres, this restored TIMI 3 flow in the vessel.  The initial flow was TIMI zero.  The lesion was then dilated with a 2.5 x 15-mm apex balloon to 8 atmospheres.  The lesion was actually fairly focal and after giving nitroglycerin, the distal vessel was free of any significant disease. The lesion was treated with a 3.0 x 20-mm Promus Element drug-eluting stent and this was deployed at 12 atmospheres and Simms Quantum apex 3.25 x 15 balloon was used to postdilate to 16 atmospheres.  There was an excellent result with 0% residual stenosis and TIMI 3 flow. At that point, I elected to move to the LAD.  A 7-French XB LAD 3.5-cm guide catheter was inserted and initial angiography was performed.  This demonstrated an ulcerated plaque in the mid-LAD with 80% stenosis.  The second diagonal arising from the middle of this lesion also had a tight 70-80% ostial stenosis.  The LAD was wired with a Cougar guidewire.  The LAD was predilated with 2.5 X 15-mm balloon to 8 atmospheres.  Following balloon predilatation, the diagonal appeared unchanged and I felt that it would likely remained patent.  I did not think it warranted predilatation before stenting the LAD.  The LAD was stented right across the diagonal branch with a 2.75 x 16-mm Promus Element stent.  This was deployed at 14 atmospheres.  The diagonal remained patent.  The stent was well expanded.  The stent was then postdilated with a 3.0 x 12-mm [place] Quantum apex, which was taken to 14 and then 16 atmospheres.  So, the entire  stented segment was postdilated.  There was an excellent angiographic result with 0% residual stenosis.  The diagonal remained patent with ostial stenosis present, but there was TIMI 3 flow.  The patient was chest pain free and she had an excellent result in the main LAD branch.  I thought she had received the maximum benefit from her intervention and the guide catheter was removed.  A Perclose device was used for femoral hemostasis.  The entire procedure was well tolerated.  FINAL ASSESSMENT: 1. Successful stenting of the right coronary artery chronic occlusion     using a single drug-eluting stent. 2. Successful stenting of the mid left anterior descending using a     single drug-eluting stent.  RECOMMENDATIONS:  The patient should remain on dual antiplatelet therapy with aspirin and Plavix for a minimum of 12 months.     ASSESSMENT AND PLAN: 1.  CAD, native vessel, without angina: appears stable. Will continue ASA at low dose and a statin with dose limited by tolerance. Will check an exercise ECG since she had no chest pain at her initial presentation. Plan FU one year.  2. Hyperlipidemia: most recent lipids reviewed as above. Continue crestor 10 mg daily. She's had problems with myalgias on other agents but seems to be doing well on crestor. LDL cholesterol is 71 mg/dL.   Current medicines are reviewed with the patient today.  The patient does not have concerns regarding medicines.  Labs/ tests ordered today include:   Orders Placed This Encounter  Procedures  . EKG 12-Lead  . EXERCISE TOLERANCE TEST (ETT)    Disposition:   FU one year  Signed, Sherren Mocha, MD  01/04/2017 1:37 PM    [facility] [address], [place], [place]  [address] Phone: [phone]; Fax: [phone]

## 2017-01-04 NOTE — Patient Instructions (Signed)
Medication Instructions:  Your provider recommends that you continue on your current medications as directed. Please refer to the Current Medication list given to you today.    Labwork: None  Testing/Procedures: Your provider has requested that you have an exercise tolerance test. For further information please visit [email or web address]. Please also follow instruction sheet, as given.   Follow-Up: Your provider wants you to follow-up in: 1 year with Dr. Cooper. You will receive a reminder letter in the mail two months in advance. If you don't receive a letter, please call our office to schedule the follow-up appointment.    Any Other Special Instructions Will Be Listed Below (If Applicable).     If you need a refill on your cardiac medications before your next appointment, please call your pharmacy.   

## 2017-01-19 ENCOUNTER — Ambulatory Visit (INDEPENDENT_AMBULATORY_CARE_PROVIDER_SITE_OTHER): Payer: PPO

## 2017-01-19 DIAGNOSIS — I2510 Atherosclerotic heart disease of native coronary artery without angina pectoris: Secondary | ICD-10-CM | POA: Diagnosis not present

## 2017-01-19 LAB — EXERCISE TOLERANCE TEST
CHL RATE OF PERCEIVED EXERTION: 17
CSEPED: 3 min
CSEPHR: 100 %
Estimated workload: 5.5 METS
Exercise duration (sec): 49 s
MPHR: 145 {beats}/min
Peak HR: 146 {beats}/min
Rest HR: 75 {beats}/min

## 2017-02-01 DIAGNOSIS — H35363 Drusen (degenerative) of macula, bilateral: Secondary | ICD-10-CM | POA: Diagnosis not present

## 2017-02-01 DIAGNOSIS — H40013 Open angle with borderline findings, low risk, bilateral: Secondary | ICD-10-CM | POA: Diagnosis not present

## 2017-02-01 DIAGNOSIS — D3132 Benign neoplasm of left choroid: Secondary | ICD-10-CM | POA: Diagnosis not present

## 2017-02-01 DIAGNOSIS — H26491 Other secondary cataract, right eye: Secondary | ICD-10-CM | POA: Diagnosis not present

## 2017-02-12 ENCOUNTER — Other Ambulatory Visit: Payer: Self-pay | Admitting: Cardiovascular Disease

## 2017-02-16 ENCOUNTER — Ambulatory Visit: Payer: PPO | Admitting: Cardiovascular Disease

## 2017-03-21 DIAGNOSIS — D225 Melanocytic nevi of trunk: Secondary | ICD-10-CM | POA: Diagnosis not present

## 2017-03-21 DIAGNOSIS — L821 Other seborrheic keratosis: Secondary | ICD-10-CM | POA: Diagnosis not present

## 2017-03-21 DIAGNOSIS — L570 Actinic keratosis: Secondary | ICD-10-CM | POA: Diagnosis not present

## 2017-03-21 DIAGNOSIS — Z23 Encounter for immunization: Secondary | ICD-10-CM | POA: Diagnosis not present

## 2017-03-21 DIAGNOSIS — Z85828 Personal history of other malignant neoplasm of skin: Secondary | ICD-10-CM | POA: Diagnosis not present

## 2017-04-06 ENCOUNTER — Other Ambulatory Visit: Payer: Self-pay | Admitting: Emergency Medicine

## 2017-04-06 MED ORDER — RANITIDINE HCL 150 MG PO TABS: ORAL_TABLET | ORAL | 0 refills | Status: DC

## 2017-04-19 ENCOUNTER — Encounter: Payer: Self-pay | Admitting: Internal Medicine

## 2017-05-17 ENCOUNTER — Telehealth: Payer: Self-pay | Admitting: Internal Medicine

## 2017-05-17 NOTE — Telephone Encounter (Signed)
Patient would like to transfer care from Dr Quay Burow to Dr. Jerline Pain. Patient states that she would like to do this because of location and convenience. Her husband is also requesting to transfer care. Please advise.

## 2017-05-17 NOTE — Telephone Encounter (Signed)
Ok with me 

## 2017-05-18 NOTE — Telephone Encounter (Signed)
Patient scheduled.

## 2017-05-18 NOTE — Telephone Encounter (Signed)
Ok with me 

## 2017-06-01 ENCOUNTER — Ambulatory Visit (AMBULATORY_SURGERY_CENTER): Payer: Self-pay | Admitting: *Deleted

## 2017-06-01 ENCOUNTER — Other Ambulatory Visit: Payer: Self-pay

## 2017-06-01 ENCOUNTER — Encounter: Payer: Self-pay | Admitting: Internal Medicine

## 2017-06-01 VITALS — Ht 64.5 in | Wt 139.8 lb

## 2017-06-01 DIAGNOSIS — Z1211 Encounter for screening for malignant neoplasm of colon: Secondary | ICD-10-CM

## 2017-06-01 NOTE — Progress Notes (Signed)
No egg or soy allergy known to patient  No issues with past sedation with any surgeries  or procedures, no intubation problems  No diet pills per patient No home 02 use per patient  No blood thinners per patient  Pt denies issues with constipation  No A fib or A flutter  EMMI video sent to pt's e mail   Pt. Complains of mucous in stool and rt. Sided rectal pain at time.

## 2017-06-15 ENCOUNTER — Other Ambulatory Visit: Payer: Self-pay

## 2017-06-15 ENCOUNTER — Ambulatory Visit (AMBULATORY_SURGERY_CENTER): Payer: PPO | Admitting: Internal Medicine

## 2017-06-15 ENCOUNTER — Encounter: Payer: Self-pay | Admitting: Internal Medicine

## 2017-06-15 VITALS — BP 108/61 | HR 61 | Temp 96.2°F | Resp 14 | Ht 64.0 in | Wt 141.0 lb

## 2017-06-15 DIAGNOSIS — I2510 Atherosclerotic heart disease of native coronary artery without angina pectoris: Secondary | ICD-10-CM | POA: Diagnosis not present

## 2017-06-15 DIAGNOSIS — K219 Gastro-esophageal reflux disease without esophagitis: Secondary | ICD-10-CM | POA: Diagnosis not present

## 2017-06-15 DIAGNOSIS — Z86010 Personal history of colonic polyps: Secondary | ICD-10-CM | POA: Diagnosis not present

## 2017-06-15 DIAGNOSIS — Z1212 Encounter for screening for malignant neoplasm of rectum: Secondary | ICD-10-CM

## 2017-06-15 DIAGNOSIS — Z1211 Encounter for screening for malignant neoplasm of colon: Secondary | ICD-10-CM | POA: Diagnosis not present

## 2017-06-15 MED ORDER — SODIUM CHLORIDE 0.9 % IV SOLN: 500.0000 mL | Freq: Once | INTRAVENOUS | Status: DC

## 2017-06-15 NOTE — Patient Instructions (Addendum)
No polyps or cancer seen today! You do have diverticulosis - thickened muscle rings and pouches in the colon wall. Please read the handout about this condition.  Given your history (no polyps) and age you do not need further routine colon cancer screening tests (colonoscopy or stool tests).  I appreciate the opportunity to care for you. Gatha Mayer, MD, FACG YOU HAD AN ENDOSCOPIC PROCEDURE TODAY AT [facility]:   Refer to the procedure report that was given to you for any specific questions about what was found during the examination.  If the procedure report does not answer your questions, please call your gastroenterologist to clarify.  If you requested that your care partner not be given the details of your procedure findings, then the procedure report has been included in a sealed envelope for you to review at your convenience later.  YOU SHOULD EXPECT: Some feelings of bloating in the abdomen. Passage of more gas than usual.  Walking can help get rid of the air that was put into your GI tract during the procedure and reduce the bloating. If you had a lower endoscopy (such as a colonoscopy or flexible sigmoidoscopy) you may notice spotting of blood in your stool or on the toilet paper. If you underwent a bowel prep for your procedure, you may not have a normal bowel movement for a few days.  Please Note:  You might notice some irritation and congestion in your nose or some drainage.  This is from the oxygen used during your procedure.  There is no need for concern and it should clear up in a day or so.  SYMPTOMS TO REPORT IMMEDIATELY:   Following lower endoscopy (colonoscopy or flexible sigmoidoscopy):  Excessive amounts of blood in the stool  Significant tenderness or worsening of abdominal pains  Swelling of the abdomen that is new, acute  Fever of 100F or higher  For urgent or emergent issues, a gastroenterologist can be reached at any hour by calling [phone]  [phone].   DIET:  We do recommend a small meal at first, but then you may proceed to your regular diet.  Drink plenty of fluids but you should avoid alcoholic beverages for 24 hours.  MEDICATIONS: Continue present medications.  Please see handouts given to you by your recovery nurse.  ACTIVITY:  You should plan to take it easy for the rest of today and you should NOT DRIVE or use heavy machinery until tomorrow (because of the sedation medicines used during the test).    FOLLOW UP: Our staff will call the number listed on your records the next business day following your procedure to check on you and address any questions or concerns that you may have regarding the information given to you following your procedure. If we do not reach you, we will leave a message.  However, if you are feeling well and you are not experiencing any problems, there is no need to return our call.  We will assume that you have returned to your regular daily activities without incident.  If any biopsies were taken you will be contacted by phone or by letter within the next 1-3 weeks.  Please call us at [phone] if you have not heard about the biopsies in 3 weeks.   Thank you for allowing [place] to provide for your healthcare needs today.  SIGNATURES/CONFIDENTIALITY: You and/or your care partner have signed paperwork which will be entered into your electronic medical record.  These signatures attest  to the fact that that the information above on your After Visit Summary has been reviewed and is understood.  Full responsibility of the confidentiality of this discharge information lies with you and/or your care-partner. 

## 2017-06-15 NOTE — Progress Notes (Signed)
No changes in medical or surgical hx since PV per pt 

## 2017-06-15 NOTE — Progress Notes (Signed)
Report given to PACU, vss 

## 2017-06-15 NOTE — Op Note (Signed)
[place] Patient Name: Tenee Wish Procedure Date: 06/15/2017 8:30 AM MRN: [id number] Endoscopist: Gatha Mayer , MD Age: 77 Referring MD:  Date of Birth: 03-03-1941 Gender: Female Account #: [id number] Procedure:                Colonoscopy Indications:              Screening for colorectal malignant neoplasm, Last                            colonoscopy: 2007 Medicines:                Propofol per Anesthesia, Monitored Anesthesia Care Procedure:                Pre-Anesthesia Assessment:                           - Prior to the procedure, a History and Physical                            was performed, and patient medications and                            allergies were reviewed. The patient's tolerance of                            previous anesthesia was also reviewed. The risks                            and benefits of the procedure and the sedation                            options and risks were discussed with the patient.                            All questions were answered, and informed consent                            was obtained. Prior Anticoagulants: The patient has                            taken no previous anticoagulant or antiplatelet                            agents. ASA Grade Assessment: III - A patient with                            severe systemic disease. After reviewing the risks                            and benefits, the patient was deemed in                            satisfactory condition to undergo the procedure.  After obtaining informed consent, the colonoscope                            was passed under direct vision. Throughout the                            procedure, the patient's blood pressure, pulse, and                            oxygen saturations were monitored continuously. The                            Colonoscope was introduced through the anus and                            advanced to the  the cecum, identified by                            appendiceal orifice and ileocecal valve. The                            colonoscopy was performed without difficulty. The                            patient tolerated the procedure well. The quality                            of the bowel preparation was good. The bowel                            preparation used was Miralax. The ileocecal valve,                            appendiceal orifice, and rectum were photographed. Scope In: 8:41:54 AM Scope Out: 8:56:25 AM Scope Withdrawal Time: 0 hours 8 minutes 42 seconds  Total Procedure Duration: 0 hours 14 minutes 31 seconds  Findings:                 The perianal and digital rectal examinations were                            normal.                           Multiple diverticula were found in the sigmoid                            colon.                           The exam was otherwise without abnormality on                            direct and retroflexion views. Complications:            No immediate complications. Estimated Blood  Loss:     Estimated blood loss: none. Impression:               - Diverticulosis in the sigmoid colon.                           - The examination was otherwise normal on direct                            and retroflexion views.                           - No specimens collected. Recommendation:           - Patient has a contact number available for                            emergencies. The signs and symptoms of potential                            delayed complications were discussed with the                            patient. Return to normal activities tomorrow.                            Written discharge instructions were provided to the                            patient.                           - Resume previous diet.                           - Continue present medications.                           - No repeat colonoscopy due to age and the  absence                            of colonic polyps. Gatha Mayer, MD 06/15/2017 9:00:38 AM This report has been signed electronically.

## 2017-06-16 ENCOUNTER — Telehealth: Payer: Self-pay | Admitting: *Deleted

## 2017-06-16 NOTE — Telephone Encounter (Signed)
  Follow up Call-  Call back number 06/15/2017  Post procedure Call Back phone  # [phone]  Permission to leave phone message Yes  Some recent data might be hidden     Patient questions:  Do you have a fever, pain , or abdominal swelling? No. Pain Score  0 *  Have you tolerated food without any problems? Yes.    Have you been able to return to your normal activities? Yes.    Do you have any questions about your discharge instructions: Diet   No. Medications  No. Follow up visit  No.  Do you have questions or concerns about your Care? No.  Actions: * If pain score is 4 or above: No action needed, pain <4.

## 2017-06-26 ENCOUNTER — Other Ambulatory Visit: Payer: Self-pay

## 2017-06-26 ENCOUNTER — Telehealth: Payer: Self-pay | Admitting: Family Medicine

## 2017-06-26 MED ORDER — RANITIDINE HCL 150 MG PO TABS: ORAL_TABLET | ORAL | 0 refills | Status: DC

## 2017-06-26 NOTE — Telephone Encounter (Signed)
Ok with me. Please place any necessary orders. 

## 2017-06-26 NOTE — Telephone Encounter (Signed)
OK to refill or wait until appt?

## 2017-06-26 NOTE — Telephone Encounter (Signed)
See note

## 2017-06-26 NOTE — Telephone Encounter (Signed)
Copied from [place] [id number]. Topic: Quick Communication - Rx Refill/Question >> Jun 26, 2017 10:16 AM Ether Griffins B wrote: Medication: ranitidine (ZANTAC) 150 MG tablet  Pt has new pt appt set up in May. Pt has a week supply left.   Preferred Pharmacy (with phone number or street name): [id number] - [place], [place] Agent: Please be advised that RX refills may take up to 3 business days. We ask that you follow-up with your pharmacy.

## 2017-06-26 NOTE — Telephone Encounter (Signed)
Done

## 2017-08-10 ENCOUNTER — Encounter: Payer: Self-pay | Admitting: Family Medicine

## 2017-08-10 ENCOUNTER — Ambulatory Visit: Payer: PPO | Admitting: Family Medicine

## 2017-08-10 ENCOUNTER — Ambulatory Visit (INDEPENDENT_AMBULATORY_CARE_PROVIDER_SITE_OTHER): Payer: PPO | Admitting: Family Medicine

## 2017-08-10 VITALS — BP 106/60 | HR 76 | Temp 98.1°F | Resp 12 | Ht 64.5 in | Wt 137.0 lb

## 2017-08-10 DIAGNOSIS — E782 Mixed hyperlipidemia: Secondary | ICD-10-CM

## 2017-08-10 DIAGNOSIS — Z0001 Encounter for general adult medical examination with abnormal findings: Secondary | ICD-10-CM | POA: Diagnosis not present

## 2017-08-10 DIAGNOSIS — Z23 Encounter for immunization: Secondary | ICD-10-CM | POA: Diagnosis not present

## 2017-08-10 DIAGNOSIS — I2510 Atherosclerotic heart disease of native coronary artery without angina pectoris: Secondary | ICD-10-CM | POA: Diagnosis not present

## 2017-08-10 DIAGNOSIS — M2550 Pain in unspecified joint: Secondary | ICD-10-CM

## 2017-08-10 DIAGNOSIS — K219 Gastro-esophageal reflux disease without esophagitis: Secondary | ICD-10-CM | POA: Diagnosis not present

## 2017-08-10 LAB — CBC
HCT: 40.9 % (ref 36.0–46.0)
Hemoglobin: 13.7 g/dL (ref 12.0–15.0)
MCHC: 33.5 g/dL (ref 30.0–36.0)
MCV: 91.1 fl (ref 78.0–100.0)
PLATELETS: 260 10*3/uL (ref 150.0–400.0)
RBC: 4.49 Mil/uL (ref 3.87–5.11)
RDW: 13.7 % (ref 11.5–15.5)
WBC: 5.4 10*3/uL (ref 4.0–10.5)

## 2017-08-10 LAB — COMPREHENSIVE METABOLIC PANEL
ALT: 8 U/L (ref 0–35)
AST: 19 U/L (ref 0–37)
Albumin: 4.6 g/dL (ref 3.5–5.2)
Alkaline Phosphatase: 55 U/L (ref 39–117)
BUN: 12 mg/dL (ref 6–23)
CALCIUM: 9.7 mg/dL (ref 8.4–10.5)
CHLORIDE: 107 meq/L (ref 96–112)
CO2: 27 meq/L (ref 19–32)
CREATININE: 0.93 mg/dL (ref 0.40–1.20)
GFR: 62.29 mL/min (ref >60.00)
GLUCOSE: 99 mg/dL (ref 70–99)
Potassium: 5.2 mEq/L — ABNORMAL HIGH (ref 3.5–5.1)
Sodium: 141 mEq/L (ref 135–145)
Total Bilirubin: 0.6 mg/dL (ref 0.2–1.2)
Total Protein: 7 g/dL (ref 6.0–8.3)

## 2017-08-10 LAB — LIPID PANEL
CHOL/HDL RATIO: 3
Cholesterol: 165 mg/dL (ref 0–200)
HDL: 59.5 mg/dL (ref >39.00)
LDL CALC: 86 mg/dL (ref 0–99)
NonHDL: 105.84
TRIGLYCERIDES: 101 mg/dL (ref 0.0–149.0)
VLDL: 20.2 mg/dL (ref 0.0–40.0)

## 2017-08-10 LAB — TSH: TSH: 2.23 u[IU]/mL (ref 0.35–4.50)

## 2017-08-10 NOTE — Progress Notes (Signed)
Subjective:  [name] is a 77 y.o. female who presents today for her annual comprehensive physical exam and to transfer care to this office  HPI:  She has no acute complaints today.   Lifestyle Diet: Low salt and low carb diet.  Exercise: Used to talk 3-4 times per week in the park. Works out daily. Stamina is not mu  Depression screen PHQ 2/9 08/10/2017  Decreased Interest 0  Down, Depressed, Hopeless 0  PHQ - 2 Score 0   Health Maintenance Due  Topic Date Due  . DEXA SCAN  01/26/2007   ROS: A complete review of systems was negative.   PMH:  The following were reviewed and entered/updated in epic: Past Medical History:  Diagnosis Date  . Allergy   . Anemia   . Arthritis   . CAD (coronary artery disease)    Dr Aundra Dubin  . Cancer (HCC)    BCC nose  . Cataract    both eyes- surgically  removed  . Diverticulosis of colon   . GERD (gastroesophageal reflux disease)   . Herpes zoster    1997 L flank  . Hyperlipidemia    intol. to statins in past  . Osteopenia    Dr Radene Knee, Gynecology  . Osteopenia    Patient Active Problem List   Diagnosis Date Noted  . Scoliosis 08/08/2016  . Lumbar stenosis 08/08/2016  . Varicose vein of leg 08/08/2016  . Osteoarthritis, hand 08/07/2015  . Left lumbar radiculopathy 07/03/2014  . Bursitis of left hip 02/21/2013  . Sacroiliac dysfunction 02/21/2013  . Malignant basal cell neoplasm of skin 06/25/2012  . GERD (gastroesophageal reflux disease) 05/30/2011  . CAD, NATIVE VESSEL 06/17/2010  . Pain in joint 05/21/2009  . Diverticulosis of large intestine 05/19/2008  . HYPERLIPIDEMIA 05/15/2007  . Osteopenia 05/10/2007   Past Surgical History:  Procedure Laterality Date  . CATARACT EXTRACTION  2010   OD  , Dr. Herbert Deaner  . CATARACT EXTRACTION W/ INTRAOCULAR LENS IMPLANT Left 02/2014   Vision not significantly improved  . COLONOSCOPY  2007   [facility]  . CORONARY ANGIOPLASTY WITH STENT PLACEMENT  06/19/2010   2 vessel; Dr  Burt Knack  . LUMBAR DISC SURGERY  11/03/2014  . VARICOSE VEIN SURGERY  1971   Family History  Problem Relation Age of Onset  . Osteoarthritis Mother   . Skin cancer Mother        ? melanoma  . Heart failure Mother   . CVA Mother 1  . Esophageal cancer Father 49  . Heart attack Father 11       smoker  . CVA Paternal Grandmother   . Diabetes Paternal Grandmother        prediabetic  . Deep vein thrombosis Paternal Grandmother   . Heart attack Maternal Uncle 59  . Cancer Maternal Grandmother        ? GI  . Asthma Son        EIB  . Pancreatic cancer Paternal Uncle   . Colon cancer Neg Hx   . Colon polyps Neg Hx   . Rectal cancer Neg Hx   . Stomach cancer Neg Hx     Medications- reviewed and updated Current Outpatient Medications  Medication Sig Dispense Refill  . aspirin 81 MG tablet Take 81 mg by mouth daily.      . Black Currant Seed Oil 500 MG CAPS Take 1 capsule by mouth daily.     . Calcium Citrate-Vitamin D (CITRUS CALCIUM 1500 +  D PO) Take 1 capsule by mouth daily.     . Coenzyme Q10 (COQ10) 200 MG CAPS Take 1 capsule by mouth daily.     Marland Kitchen CRANBERRY-CALCIUM PO Take 1 capsule by mouth daily.    . Gelatin 600 MG CAPS Take 1 capsule by mouth daily.     . Multiple Vitamin (MULTIVITAMIN) capsule Take 1 capsule by mouth daily.      . nitroGLYCERIN (NITROSTAT) 0.4 MG SL tablet Place 0.4 mg under the tongue every 5 (five) minutes as needed for chest pain.     Marland Kitchen oxyCODONE-acetaminophen (PERCOCET) 10-325 MG tablet Take 1 tablet by mouth every 4 (four) hours as needed for pain.    . ranitidine (ZANTAC) 150 MG tablet TAKE 1 TABLET (150 MG TOTAL) BY MOUTH DAILY. 90 tablet 0  . rosuvastatin (CRESTOR) 10 MG tablet TAKE 1 TABLET BY MOUTH EVERYDAY AT BEDTIME 30 tablet 11   No current facility-administered medications for this visit.     Allergies-reviewed and updated Allergies  Allergen Reactions  . Lisinopril     Diffuse itching w/o rash or fever  . Pravastatin Sodium      REACTION: muscle pain in arms 2009  . Metoprolol     Shortness of breath  . Oxycodone Nausea And Vomiting    Social History   Socioeconomic History  . Marital status: Married    Spouse name: Not on file  . Number of children: 4  . Years of education: Not on file  . Highest education level: Not on file  Occupational History  . Not on file  Social Needs  . Financial resource strain: Not on file  . Food insecurity:    Worry: Not on file    Inability: Not on file  . Transportation needs:    Medical: Not on file    Non-medical: Not on file  Tobacco Use  . Smoking status: Never Smoker  . Smokeless tobacco: Never Used  Substance and Sexual Activity  . Alcohol use: Yes    Comment: extremely rarely  . Drug use: No  . Sexual activity: Not on file  Lifestyle  . Physical activity:    Days per week: Not on file    Minutes per session: Not on file  . Stress: Not on file  Relationships  . Social connections:    Talks on phone: Not on file    Gets together: Not on file    Attends religious service: Not on file    Active member of club or organization: Not on file    Attends meetings of clubs or organizations: Not on file    Relationship status: Not on file  Other Topics Concern  . Not on file  Social History Narrative   Exercise: hand weight, crunches, some walking   Objective:  Physical Exam: BP 106/60   Pulse 76   Temp 98.1 F (36.7 C) (Oral)   Resp 12   Ht 5' 4.5" (1.638 m)   Wt 137 lb (62.1 kg)   SpO2 98%   BMI 23.15 kg/m   Body mass index is 23.15 kg/m. Wt Readings from Last 3 Encounters:  08/10/17 137 lb (62.1 kg)  06/15/17 141 lb (64 kg)  06/01/17 139 lb 12.8 oz (63.4 kg)  Gen: NAD, resting comfortably HEENT: TMs normal bilaterally. OP clear. No thyromegaly noted.  CV: RRR with no murmurs appreciated Pulm: NWOB, CTAB with no crackles, wheezes, or rhonchi GI: Normal bowel sounds present. Soft, Nontender, Nondistended. MSK: no edema, cyanosis,  or clubbing  noted Skin: warm, dry Neuro: CN2-12 grossly intact. Strength 5/5 in upper and lower extremities. Reflexes symmetric and intact bilaterally.  Psych: Normal affect and thought content  Assessment/Plan:  GERD (gastroesophageal reflux disease) Stable. Continue ranitidine.   CAD, NATIVE VESSEL Stable. Follows with Dr Burt Knack. Check lipid panel today. Cotinue ASA 81mg  and crestor 10mg  daily.   Pain in joint Stable. Continue tylenol as needed.   Preventative Healthcare: Will give first dose of shingrix today. Will return in 2-6 months for repeat dose. UTD on other vaccinations and screenings. Gets DEXA through her OBGYN.   Patient Counseling(The following topics were reviewed and/or handout was given):  -Nutrition: Stressed importance of moderation in sodium/caffeine intake, saturated fat and cholesterol, caloric balance, sufficient intake of fresh fruits, vegetables, and fiber.  -Stressed the importance of regular exercise.   -Substance Abuse: Discussed cessation/primary prevention of tobacco, alcohol, or other drug use; driving or other dangerous activities under the influence; availability of treatment for abuse.   -Injury prevention: Discussed safety belts, safety helmets, smoke detector, smoking near bedding or upholstery.   -Sexuality: Discussed sexually transmitted diseases, partner selection, use of condoms, avoidance of unintended pregnancy and contraceptive alternatives.   -Dental health: Discussed importance of regular tooth brushing, flossing, and dental visits.  -Health maintenance and immunizations reviewed. Please refer to Health maintenance section.  Return to care in 1 year for next preventative visit.   Algis Greenhouse. Jerline Pain, MD 08/10/2017 10:25 AM

## 2017-08-10 NOTE — Assessment & Plan Note (Signed)
Stable. Continue ranitidine.

## 2017-08-10 NOTE — Assessment & Plan Note (Signed)
Stable. Follows with Dr Burt Knack. Check lipid panel today. Cotinue ASA 81mg  and crestor 10mg  daily.

## 2017-08-10 NOTE — Patient Instructions (Signed)
Preventive Care 65 Years and Older, Female Preventive care refers to lifestyle choices and visits with your health care provider that can promote health and wellness. What does preventive care include?  A yearly physical exam. This is also called an annual well check.  Dental exams once or twice a year.  Routine eye exams. Ask your health care provider how often you should have your eyes checked.  Personal lifestyle choices, including: ? Daily care of your teeth and gums. ? Regular physical activity. ? Eating a healthy diet. ? Avoiding tobacco and drug use. ? Limiting alcohol use. ? Practicing safe sex. ? Taking low-dose aspirin every day. ? Taking vitamin and mineral supplements as recommended by your health care provider. What happens during an annual well check? The services and screenings done by your health care provider during your annual well check will depend on your age, overall health, lifestyle risk factors, and family history of disease. Counseling Your health care provider may ask you questions about your:  Alcohol use.  Tobacco use.  Drug use.  Emotional well-being.  Home and relationship well-being.  Sexual activity.  Eating habits.  History of falls.  Memory and ability to understand (cognition).  Work and work environment.  Reproductive health.  Screening You may have the following tests or measurements:  Height, weight, and BMI.  Blood pressure.  Lipid and cholesterol levels. These may be checked every 5 years, or more frequently if you are over 50 years old.  Skin check.  Lung cancer screening. You may have this screening every year starting at age 55 if you have a 30-pack-year history of smoking and currently smoke or have quit within the past 15 years.  Fecal occult blood test (FOBT) of the stool. You may have this test every year starting at age 50.  Flexible sigmoidoscopy or colonoscopy. You may have a sigmoidoscopy every 5 years or  a colonoscopy every 10 years starting at age 50.  Hepatitis C blood test.  Hepatitis B blood test.  Sexually transmitted disease (STD) testing.  Diabetes screening. This is done by checking your blood sugar (glucose) after you have not eaten for a while (fasting). You may have this done every 1-3 years.  Bone density scan. This is done to screen for osteoporosis. You may have this done starting at age 65.  Mammogram. This may be done every 1-2 years. Talk to your health care provider about how often you should have regular mammograms.  Talk with your health care provider about your test results, treatment options, and if necessary, the need for more tests. Vaccines Your health care provider may recommend certain vaccines, such as:  Influenza vaccine. This is recommended every year.  Tetanus, diphtheria, and acellular pertussis (Tdap, Td) vaccine. You may need a Td booster every 10 years.  Varicella vaccine. You may need this if you have not been vaccinated.  Zoster vaccine. You may need this after age 60.  Measles, mumps, and rubella (MMR) vaccine. You may need at least one dose of MMR if you were born in 1957 or later. You may also need a second dose.  Pneumococcal 13-valent conjugate (PCV13) vaccine. One dose is recommended after age 65.  Pneumococcal polysaccharide (PPSV23) vaccine. One dose is recommended after age 65.  Meningococcal vaccine. You may need this if you have certain conditions.  Hepatitis A vaccine. You may need this if you have certain conditions or if you travel or work in places where you may be exposed to hepatitis   A.  Hepatitis B vaccine. You may need this if you have certain conditions or if you travel or work in places where you may be exposed to hepatitis B.  Haemophilus influenzae type b (Hib) vaccine. You may need this if you have certain conditions.  Talk to your health care provider about which screenings and vaccines you need and how often you  need them. This information is not intended to replace advice given to you by your health care provider. Make sure you discuss any questions you have with your health care provider. Document Released: 04/17/2015 Document Revised: 12/09/2015 Document Reviewed: 01/20/2015 Elsevier Interactive Patient Education  2018 Elsevier Inc.  

## 2017-08-10 NOTE — Assessment & Plan Note (Signed)
Stable. Continue tylenol as needed.

## 2017-08-11 NOTE — Progress Notes (Signed)
Dr Marigene Ehlers interpretation of your lab work:  Your labs are all normal. We do not need to make any changes to your medications or treatment plan at this time. We can recheck in 1 year.    If you have any additional questions, please give [place] a call or send [place] a message through [place].  Take care, Dr Jerline Pain

## 2017-08-21 DIAGNOSIS — Z6823 Body mass index (BMI) 23.0-23.9, adult: Secondary | ICD-10-CM | POA: Diagnosis not present

## 2017-08-21 DIAGNOSIS — Z01419 Encounter for gynecological examination (general) (routine) without abnormal findings: Secondary | ICD-10-CM | POA: Diagnosis not present

## 2017-08-21 DIAGNOSIS — Z1231 Encounter for screening mammogram for malignant neoplasm of breast: Secondary | ICD-10-CM | POA: Diagnosis not present

## 2017-08-31 DIAGNOSIS — N83209 Unspecified ovarian cyst, unspecified side: Secondary | ICD-10-CM | POA: Diagnosis not present

## 2017-09-22 ENCOUNTER — Other Ambulatory Visit: Payer: Self-pay | Admitting: Family Medicine

## 2017-12-09 ENCOUNTER — Other Ambulatory Visit: Payer: Self-pay | Admitting: Cardiovascular Disease

## 2017-12-24 ENCOUNTER — Other Ambulatory Visit: Payer: Self-pay | Admitting: Family Medicine

## 2017-12-26 ENCOUNTER — Encounter: Payer: Self-pay | Admitting: *Deleted

## 2017-12-26 ENCOUNTER — Ambulatory Visit (INDEPENDENT_AMBULATORY_CARE_PROVIDER_SITE_OTHER): Payer: PPO | Admitting: *Deleted

## 2017-12-26 DIAGNOSIS — Z23 Encounter for immunization: Secondary | ICD-10-CM

## 2017-12-26 NOTE — Progress Notes (Signed)
Per orders of Dr. Jerline Pain, injection of Shingrix vaccine 0.5 ml given Right Deltoid by Anselmo Pickler, LPN Patient tolerated injection well and has completed series.

## 2017-12-26 NOTE — Progress Notes (Signed)
I have reviewed the patient's encounter and agree with the documentation.  Algis Greenhouse. Jerline Pain, MD 12/26/2017 2:33 PM

## 2018-01-05 ENCOUNTER — Other Ambulatory Visit: Payer: Self-pay | Admitting: Cardiovascular Disease

## 2018-01-09 ENCOUNTER — Other Ambulatory Visit: Payer: Self-pay | Admitting: Cardiovascular Disease

## 2018-02-22 ENCOUNTER — Other Ambulatory Visit: Payer: Self-pay | Admitting: *Deleted

## 2018-02-22 ENCOUNTER — Encounter: Payer: Self-pay | Admitting: Physician Assistant

## 2018-02-22 MED ORDER — ROSUVASTATIN CALCIUM 10 MG PO TABS: 10.0000 mg | ORAL_TABLET | Freq: Every day | ORAL | 0 refills | Status: DC

## 2018-03-05 DIAGNOSIS — H35363 Drusen (degenerative) of macula, bilateral: Secondary | ICD-10-CM | POA: Diagnosis not present

## 2018-03-05 DIAGNOSIS — D3132 Benign neoplasm of left choroid: Secondary | ICD-10-CM | POA: Diagnosis not present

## 2018-03-05 DIAGNOSIS — H40013 Open angle with borderline findings, low risk, bilateral: Secondary | ICD-10-CM | POA: Diagnosis not present

## 2018-03-05 DIAGNOSIS — H26493 Other secondary cataract, bilateral: Secondary | ICD-10-CM | POA: Diagnosis not present

## 2018-03-13 ENCOUNTER — Encounter: Payer: Self-pay | Admitting: Physician Assistant

## 2018-03-13 ENCOUNTER — Ambulatory Visit: Payer: PPO | Admitting: Physician Assistant

## 2018-03-13 VITALS — BP 122/62 | HR 77 | Ht 64.5 in | Wt 140.4 lb

## 2018-03-13 DIAGNOSIS — E785 Hyperlipidemia, unspecified: Secondary | ICD-10-CM | POA: Diagnosis not present

## 2018-03-13 DIAGNOSIS — I2510 Atherosclerotic heart disease of native coronary artery without angina pectoris: Secondary | ICD-10-CM

## 2018-03-13 MED ORDER — ROSUVASTATIN CALCIUM 10 MG PO TABS: 10.0000 mg | ORAL_TABLET | Freq: Every day | ORAL | 3 refills | Status: DC

## 2018-03-13 NOTE — Progress Notes (Signed)
Cardiology Office Note:    Date:  03/13/2018   ID:  [name], DOB [birth year], MRN [id number]  PCP:  Natalie Barrack, MD  Cardiologist:  Natalie Mocha, MD  Electrophysiologist:  None   Referring MD: Natalie Barrack, MD   Chief Complaint  Patient presents with  . Follow-up    CAD    History of Present Illness:    [name] is a 77 y.o. female with coronary artery disease status post drug-eluting stent to the LAD and RCA in 2012, hyperlipidemia with intolerance to statins.  She was previously followed by Dr. Aundra Fuller and established with Dr. Burt Fuller in October 2018.  A follow-up exercise tolerance test was normal.  [name] returns for follow-up.  She is here alone.  She has been doing well without chest discomfort, significant shortness of breath, paroxysmal nocturnal dyspnea, leg swelling or syncope.  She walks on a daily basis.  She also does daily crunches and low weight training.  Prior CV studies:   The following studies were reviewed today:  Exercise tolerance test 01/19/2017 Poor exercise capacity. 85% of maximum heart rate was achieved after 1.4 minutes.  Hypertensive response to exercise with max blood pressure 206/78 mmHg. No ischemia.  Cardiopulmonary stress test 06/14/2012 Conclusion: Exercise testing with gas exchange demonstrates a normal functional capacity when compared to matched sedentary norms. The flattened O2 pulse suggests a very mild circulatory limitation at peak in the setting of a hypertensive response to exercise which likely represents mild diastolic dysfunction. Resting spirometry showed mild obstruction but this was not a limiting factor at peak.   Echo 06/13/2012 EF 60-65, normal wall motion, grade 1 diastolic dysfunction, trivial AI, normal RVSF, PASP 27, trivial pericardial effusion  Nuclear stress test 07/12/2011 Normal stress nuclear study.  LV Ejection Fraction: 69%.    Cardiac catheterization 06/15/2010 EF 60, severe inferior HK  to AK RCA proximal subtotally occluded with left-to-right collaterals LM normal LCx minimal disease LAD mid 80; D2 ostial 80 PCI: 3 x 20 mm Promus DES to the RCA PCI: 2.75 x 16 mm Promus DES to the LAD  Past Medical History:  Diagnosis Date  . Allergy   . Anemia   . Arthritis   . CAD (coronary artery disease)    Dr [name]  . Cancer (HCC)    BCC nose  . Cataract    both eyes- surgically  removed  . Diverticulosis of colon   . GERD (gastroesophageal reflux disease)   . Herpes zoster    1997 L flank  . Hyperlipidemia    intol. to statins in past  . Osteopenia    Dr [name], Gynecology  . Osteopenia    Surgical Hx: The patient  has a past surgical history that includes Varicose vein surgery (1971); Cataract extraction (2010); Coronary angioplasty with stent (06/19/2010); Colonoscopy (2007); Cataract extraction w/ intraocular lens implant (Left, 02/2014); and Lumbar disc surgery (11/03/2014).   Current Medications: Current Meds  Medication Sig  . aspirin 81 MG tablet Take 81 mg by mouth daily.    . Black Currant Seed Oil 500 MG CAPS Take 1 capsule by mouth daily.   . Calcium Citrate-Vitamin D (CITRUS CALCIUM 1500 + D PO) Take 1 capsule by mouth daily.   . Coenzyme Q10 (COQ10) 200 MG CAPS Take 1 capsule by mouth daily.   Marland Kitchen CRANBERRY-CALCIUM PO Take 1 capsule by mouth daily.  . Gelatin 600 MG CAPS Take 1 capsule by mouth daily.   . Multiple  Vitamin (MULTIVITAMIN) capsule Take 1 capsule by mouth daily.    . nitroGLYCERIN (NITROSTAT) 0.4 MG SL tablet Place 0.4 mg under the tongue every 5 (five) minutes as needed for chest pain.   Marland Kitchen oxyCODONE-acetaminophen (PERCOCET) 10-325 MG tablet Take 1 tablet by mouth every 4 (four) hours as needed for pain.  . rosuvastatin (CRESTOR) 10 MG tablet Take 1 tablet (10 mg total) by mouth daily.  . [DISCONTINUED] ranitidine (ZANTAC) 150 MG tablet TAKE 1 TABLET BY MOUTH EVERY DAY     Allergies:   Lisinopril; Metoprolol; Pravastatin sodium; and  Oxycodone   Social History   Tobacco Use  . Smoking status: Never Smoker  . Smokeless tobacco: Never Used  Substance Use Topics  . Alcohol use: Yes    Comment: extremely rarely  . Drug use: No     Family Hx: The patient's family history includes Asthma in her son; CVA in her paternal grandmother; CVA (age of onset: 47) in her mother; Cancer in her maternal grandmother; Deep vein thrombosis in her paternal grandmother; Diabetes in her paternal grandmother; Esophageal cancer (age of onset: 69) in her father; Heart attack (age of onset: 56) in her maternal uncle; Heart attack (age of onset: 8) in her father; Heart failure in her mother; Osteoarthritis in her mother; Pancreatic cancer in her paternal uncle; Skin cancer in her mother. There is no history of Colon cancer, Colon polyps, Rectal cancer, or Stomach cancer.  ROS:   Please see the history of present illness.    Review of Systems  Musculoskeletal: Positive for back pain and myalgias.   All other systems reviewed and are negative.   EKGs/Labs/Other Test Reviewed:    EKG:  EKG is  ordered today.  The ekg ordered today demonstrates normal sinus rhythm, heart rate 77, normal axis, QTC 432, similar to prior tracing  Recent Labs: 08/10/2017: ALT 8; BUN 12; Creatinine, Ser 0.93; Hemoglobin 13.7; Platelets 260.0; Potassium 5.2; Sodium 141; TSH 2.23   Recent Lipid Panel Lab Results  Component Value Date/Time   CHOL 165 08/10/2017 09:28 AM   TRIG 101.0 08/10/2017 09:28 AM   TRIG 75 02/08/2006 12:23 PM   HDL 59.50 08/10/2017 09:28 AM   CHOLHDL 3 08/10/2017 09:28 AM   LDLCALC 86 08/10/2017 09:28 AM   LDLDIRECT 158.8 05/24/2010 10:13 AM   From [facility] Cholesterol, total 165.000 08/10/2017 HDL 59.500 08/10/2017 LDL 86.000 08/10/2017 Triglycerides 101.000 08/10/2017 Hemoglobin 13.700 08/10/2017 Creatinine, Serum 0.930 08/10/2017 Potassium 5.200 08/10/2017 ALT (SGPT) 8.000 08/10/2017 TSH 2.230 08/10/2017 Platelets 260.000 08/10/2017  Physical  Exam:    VS:  BP 122/62   Pulse 77   Ht 5' 4.5" (1.638 m)   Wt 140 lb 6.4 oz (63.7 kg)   SpO2 96%   BMI 23.73 kg/m     Wt Readings from Last 3 Encounters:  03/13/18 140 lb 6.4 oz (63.7 kg)  08/10/17 137 lb (62.1 kg)  06/15/17 141 lb (64 kg)     Physical Exam  Constitutional: She is oriented to person, place, and time. She appears well-developed and well-nourished. No distress.  HENT:  Head: Normocephalic and atraumatic.  Neck: Neck supple. No JVD present.  Cardiovascular: Normal rate, regular rhythm, S1 normal, S2 normal and normal heart sounds.  No murmur heard. Pulmonary/Chest: Effort normal. She has no rales.  Abdominal: Soft. There is no hepatomegaly.  Musculoskeletal: She exhibits no edema.  Neurological: She is alert and oriented to person, place, and time.  Skin: Skin is warm and dry.  ASSESSMENT & PLAN:    Coronary artery disease involving native coronary artery of native heart without angina pectoris  History of DES to the LAD and RCA in 2012.  Exercise treadmill test last year was low risk.  She exercises on a regular basis without symptoms to suggest angina.  ECG is unchanged.  Blood pressure is controlled.  Continue aspirin, statin.  Hyperlipidemia, unspecified hyperlipidemia type She has a history of intolerance to statins.  She has been able to tolerate rosuvastatin fairly well.  She has noted occasional cramping in her arms.  This has worsened over the years since she has started taking rosuvastatin.  Recent LDL was 86.  If her LDL remains >70, or if she has worsening myalgias, consider referral to the lipid clinic for PCSK9 inhibitor therapy.  We could also consider the addition of ezetimibe to her medical regimen.   Dispo:  Return in about 1 year (around 03/14/2019) for Routine Follow Up, w/ Dr. Burt Fuller, or Richardson Dopp, PA-C.   Medication Adjustments/Labs and Tests Ordered: Current medicines are reviewed at length with the patient today.  Concerns  regarding medicines are outlined above.  Tests Ordered: Orders Placed This Encounter  Procedures  . EKG 12-Lead   Medication Changes: Meds ordered this encounter  Medications  . rosuvastatin (CRESTOR) 10 MG tablet    Sig: Take 1 tablet (10 mg total) by mouth daily.    Dispense:  90 tablet    Refill:  3    Order Specific Question:   Supervising Provider    Answer:   Thayer Headings [[id number]]    Signed, Richardson Dopp, PA-C  03/13/2018 4:41 PM    [facility] [address], [place], [place]  [address] Phone: [phone]; Fax: [phone]

## 2018-03-13 NOTE — Patient Instructions (Signed)
Medication Instructions:  Your physician recommends that you continue on your current medications as directed. Please refer to the Current Medication list given to you today.   If you need a refill on your cardiac medications before your next appointment, please call your pharmacy.   Lab work: NONE  If you have labs (blood work) drawn today and your tests are completely normal, you will receive your results only by: . MyChart Message (if you have MyChart) OR . A paper copy in the mail If you have any lab test that is abnormal or we need to change your treatment, we will call you to review the results.  Testing/Procedures: NONE  Follow-Up: At [facility], you and your health needs are our priority.  As part of our continuing mission to provide you with exceptional heart care, we have created designated Provider Care Teams.  These Care Teams include your primary Cardiologist (physician) and Advanced Practice Providers (APPs -  Physician Assistants and Nurse Practitioners) who all work together to provide you with the care you need, when you need it. . You will need a follow up appointment in:  12 months.  Please call our office 2 months in advance to schedule this appointment.  You may see Michael Cooper, MD or one of the following Advanced Practice Providers on your designated Care Team: . Scott Weaver, PA-C . Vin Bhagat, PA-C . Janine Hammond, NP  Any Other Special Instructions Will Be Listed Below (If Applicable).    

## 2018-03-19 ENCOUNTER — Other Ambulatory Visit: Payer: Self-pay | Admitting: Family Medicine

## 2018-04-17 DIAGNOSIS — Z23 Encounter for immunization: Secondary | ICD-10-CM | POA: Diagnosis not present

## 2018-04-17 DIAGNOSIS — Z85828 Personal history of other malignant neoplasm of skin: Secondary | ICD-10-CM | POA: Diagnosis not present

## 2018-04-17 DIAGNOSIS — L821 Other seborrheic keratosis: Secondary | ICD-10-CM | POA: Diagnosis not present

## 2018-04-17 DIAGNOSIS — L309 Dermatitis, unspecified: Secondary | ICD-10-CM | POA: Diagnosis not present

## 2018-08-13 ENCOUNTER — Encounter: Payer: PPO | Admitting: Family Medicine

## 2018-10-10 DIAGNOSIS — M4150 Other secondary scoliosis, site unspecified: Secondary | ICD-10-CM | POA: Diagnosis not present

## 2018-10-10 DIAGNOSIS — Z7189 Other specified counseling: Secondary | ICD-10-CM | POA: Diagnosis not present

## 2018-10-15 ENCOUNTER — Emergency Department (HOSPITAL_COMMUNITY)
Admission: EM | Admit: 2018-10-15 | Discharge: 2018-10-15 | Disposition: A | Discharge status: Home / Self Care | Payer: PPO | Attending: Emergency Medicine | Admitting: Emergency Medicine

## 2018-10-15 ENCOUNTER — Encounter (HOSPITAL_COMMUNITY): Payer: Self-pay | Admitting: Emergency Medicine

## 2018-10-15 ENCOUNTER — Other Ambulatory Visit: Payer: Self-pay

## 2018-10-15 ENCOUNTER — Emergency Department (HOSPITAL_COMMUNITY): Payer: PPO

## 2018-10-15 DIAGNOSIS — Z7982 Long term (current) use of aspirin: Secondary | ICD-10-CM | POA: Diagnosis not present

## 2018-10-15 DIAGNOSIS — I259 Chronic ischemic heart disease, unspecified: Secondary | ICD-10-CM | POA: Diagnosis not present

## 2018-10-15 DIAGNOSIS — M5137 Other intervertebral disc degeneration, lumbosacral region: Secondary | ICD-10-CM | POA: Diagnosis not present

## 2018-10-15 DIAGNOSIS — Z955 Presence of coronary angioplasty implant and graft: Secondary | ICD-10-CM | POA: Insufficient documentation

## 2018-10-15 DIAGNOSIS — M545 Low back pain, unspecified: Secondary | ICD-10-CM

## 2018-10-15 DIAGNOSIS — Z79899 Other long term (current) drug therapy: Secondary | ICD-10-CM | POA: Diagnosis not present

## 2018-10-15 DIAGNOSIS — M5136 Other intervertebral disc degeneration, lumbar region: Secondary | ICD-10-CM | POA: Diagnosis not present

## 2018-10-15 DIAGNOSIS — M5416 Radiculopathy, lumbar region: Secondary | ICD-10-CM | POA: Diagnosis not present

## 2018-10-15 DIAGNOSIS — C44311 Basal cell carcinoma of skin of nose: Secondary | ICD-10-CM | POA: Insufficient documentation

## 2018-10-15 DIAGNOSIS — M25552 Pain in left hip: Secondary | ICD-10-CM | POA: Diagnosis not present

## 2018-10-15 DIAGNOSIS — R9431 Abnormal electrocardiogram [ECG] [EKG]: Secondary | ICD-10-CM | POA: Diagnosis not present

## 2018-10-15 LAB — BASIC METABOLIC PANEL
Anion gap: 10 (ref 5–15)
BUN: 16 mg/dL (ref 8–23)
CO2: 23 mmol/L (ref 22–32)
Calcium: 9.3 mg/dL (ref 8.9–10.3)
Chloride: 102 mmol/L (ref 98–111)
Creatinine, Ser: 0.86 mg/dL (ref 0.44–1.00)
GFR calc Af Amer: >60 mL/min (ref >60)
GFR calc non Af Amer: >60 mL/min (ref >60)
Glucose, Bld: 106 mg/dL — ABNORMAL HIGH (ref 70–99)
Potassium: 3.3 mmol/L — ABNORMAL LOW (ref 3.5–5.1)
Sodium: 135 mmol/L (ref 135–145)

## 2018-10-15 LAB — CBC WITH DIFFERENTIAL/PLATELET
Abs Immature Granulocytes: 0.01 10*3/uL (ref 0.00–0.07)
Basophils Absolute: 0 10*3/uL (ref 0.0–0.1)
Basophils Relative: 0 %
Eosinophils Absolute: 0 10*3/uL (ref 0.0–0.5)
Eosinophils Relative: 0 %
HCT: 42.3 % (ref 36.0–46.0)
Hemoglobin: 14 g/dL (ref 12.0–15.0)
Immature Granulocytes: 0 %
Lymphocytes Relative: 17 %
Lymphs Abs: 1.3 10*3/uL (ref 0.7–4.0)
MCH: 30.2 pg (ref 26.0–34.0)
MCHC: 33.1 g/dL (ref 30.0–36.0)
MCV: 91.4 fL (ref 80.0–100.0)
Monocytes Absolute: 0.7 10*3/uL (ref 0.1–1.0)
Monocytes Relative: 9 %
Neutro Abs: 5.5 10*3/uL (ref 1.7–7.7)
Neutrophils Relative %: 74 %
Platelets: 316 10*3/uL (ref 150–400)
RBC: 4.63 MIL/uL (ref 3.87–5.11)
RDW: 12.7 % (ref 11.5–15.5)
WBC: 7.5 10*3/uL (ref 4.0–10.5)
nRBC: 0 % (ref 0.0–0.2)

## 2018-10-15 LAB — URINALYSIS, ROUTINE W REFLEX MICROSCOPIC
Bilirubin Urine: NEGATIVE
Glucose, UA: NEGATIVE mg/dL
Hgb urine dipstick: NEGATIVE
Ketones, ur: 20 mg/dL — AB
Leukocytes,Ua: NEGATIVE
Nitrite: NEGATIVE
Protein, ur: NEGATIVE mg/dL
Specific Gravity, Urine: 1.026 (ref 1.005–1.030)
pH: 5 (ref 5.0–8.0)

## 2018-10-15 MED ORDER — HYDROCODONE-ACETAMINOPHEN 5-325 MG PO TABS: 1.0000 | ORAL_TABLET | Freq: Four times a day (QID) | ORAL | 0 refills | Status: DC | PRN

## 2018-10-15 MED ORDER — MORPHINE SULFATE (PF) 2 MG/ML IV SOLN
2.0000 mg | Freq: Once | INTRAVENOUS | Status: AC
  Administered 2018-10-15: 2 mg via INTRAVENOUS
  Filled 2018-10-15: qty 1

## 2018-10-15 MED ORDER — POTASSIUM CHLORIDE CRYS ER 20 MEQ PO TBCR
30.0000 meq | EXTENDED_RELEASE_TABLET | Freq: Once | ORAL | Status: AC
  Administered 2018-10-15: 30 meq via ORAL
  Filled 2018-10-15: qty 1

## 2018-10-15 MED ORDER — ONDANSETRON HCL 4 MG/2ML IJ SOLN
4.0000 mg | Freq: Once | INTRAMUSCULAR | Status: AC
  Administered 2018-10-15: 4 mg via INTRAVENOUS
  Filled 2018-10-15: qty 2

## 2018-10-15 MED ORDER — ONDANSETRON HCL 4 MG PO TABS: 4.0000 mg | ORAL_TABLET | Freq: Four times a day (QID) | ORAL | 0 refills | Status: DC

## 2018-10-15 MED ORDER — DEXAMETHASONE SODIUM PHOSPHATE 10 MG/ML IJ SOLN
10.0000 mg | Freq: Once | INTRAMUSCULAR | Status: AC
  Administered 2018-10-15: 10 mg via INTRAMUSCULAR
  Filled 2018-10-15: qty 1

## 2018-10-15 MED ORDER — HYDROMORPHONE HCL 1 MG/ML IJ SOLN
1.0000 mg | Freq: Once | INTRAMUSCULAR | Status: AC
  Administered 2018-10-15: 15:00:00 1 mg via INTRAVENOUS
  Filled 2018-10-15: qty 1

## 2018-10-15 MED ORDER — KETOROLAC TROMETHAMINE 15 MG/ML IJ SOLN
15.0000 mg | Freq: Once | INTRAMUSCULAR | Status: AC
  Administered 2018-10-15: 15:00:00 15 mg via INTRAVENOUS
  Filled 2018-10-15: qty 1

## 2018-10-15 NOTE — ED Triage Notes (Signed)
Pt states she has had back pain since July 1st. Pt has hx of scoliosis and stenosis/ back surgeries. Pt was prescribed prednisone and muscle relaxer by her MD this past week. Pt states she has lost 7lb since July 1st. Pt states she started having n/v since this Saturday. Pt feels that her nausea is related to the medication she was prescribed.

## 2018-10-15 NOTE — ED Provider Notes (Signed)
[facility] EMERGENCY DEPARTMENT Provider Note   CSN: [id number] Arrival date & time: 10/15/18  1136     History   Chief Complaint Chief Complaint  Patient presents with  . Back Pain    HPI [name] is a 78 y.o. female.     HPI  Patient is a 78 year old female with past medical history of CAD status post stents in 2012, allergic rhinitis, arthritis, degenerative disc disease status post laminectomy in 2016 presenting for left-sided low back pain.  Patient reports that she has had progressively worsening pain since July 1.  Patient reports that she did some strenuous physical activity in her garden in late June and then began having the left-sided pain ever since.  She reports that everything will make it hurt for movement to lying flat.  Patient denies any numbness or tingling in lower extremities, weakness in lower extremities, saddle anesthesia, loss of bowel or bladder control, fever or chills.  Patient denies any dysuria, urgency, or frequency.  Patient reports that she went to her neurosurgeon's office 5 days ago and they prescribed her prednisone and muscle relaxants.  2 days later, she reports that she had nausea and vomiting and attributed this to the prednisone.  This has since resolved.  Past Medical History:  Diagnosis Date  . Allergy   . Anemia   . Arthritis   . CAD (coronary artery disease)    Dr Aundra Dubin  . Cancer (HCC)    [facility] nose  . Cataract    both eyes- surgically  removed  . Diverticulosis of colon   . GERD (gastroesophageal reflux disease)   . Herpes zoster    1997 L flank  . Hyperlipidemia    intol. to statins in past  . Osteopenia    Dr Radene Knee, Gynecology  . Osteopenia     Patient Active Problem List   Diagnosis Date Noted  . Scoliosis 08/08/2016  . Lumbar stenosis 08/08/2016  . Varicose vein of leg 08/08/2016  . Osteoarthritis, hand 08/07/2015  . Left lumbar radiculopathy 07/03/2014  . Bursitis of left hip 02/21/2013  .  Sacroiliac dysfunction 02/21/2013  . Malignant basal cell neoplasm of skin 06/25/2012  . GERD (gastroesophageal reflux disease) 05/30/2011  . CAD (coronary artery disease) 06/17/2010  . Pain in joint 05/21/2009  . Diverticulosis of large intestine 05/19/2008  . Hyperlipidemia 05/15/2007  . Osteopenia 05/10/2007    Past Surgical History:  Procedure Laterality Date  . CATARACT EXTRACTION  2010   OD  , Dr. Herbert Deaner  . CATARACT EXTRACTION W/ INTRAOCULAR LENS IMPLANT Left 02/2014   Vision not significantly improved  . COLONOSCOPY  2007   [facility]  . CORONARY ANGIOPLASTY WITH STENT PLACEMENT  06/19/2010   2 vessel; Dr Burt Knack  . LUMBAR DISC SURGERY  11/03/2014  . [facility]     OB History   No obstetric history on file.      Home Medications    Prior to Admission medications   Medication Sig Start Date End Date Taking? Authorizing Provider  aspirin 81 MG tablet Take 81 mg by mouth daily.      [provider]  Black Currant Seed Oil 500 MG CAPS Take 1 capsule by mouth daily.     [provider]  Calcium Citrate-Vitamin D (CITRUS CALCIUM 1500 + D PO) Take 1 capsule by mouth daily.     [provider]  Coenzyme Q10 (COQ10) 200 MG CAPS Take 1 capsule by  mouth daily.     [provider]  CRANBERRY-CALCIUM PO Take 1 capsule by mouth daily.    [provider]  Gelatin 600 MG CAPS Take 1 capsule by mouth daily.     [provider]  Multiple Vitamin (MULTIVITAMIN) capsule Take 1 capsule by mouth daily.      [provider]  nitroGLYCERIN (NITROSTAT) 0.4 MG SL tablet Place 0.4 mg under the tongue every 5 (five) minutes as needed for chest pain.     [provider]  oxyCODONE-acetaminophen (PERCOCET) 10-325 MG tablet Take 1 tablet by mouth every 4 (four) hours as needed for pain.    [provider]  ranitidine (ZANTAC) 150 MG tablet TAKE 1 TABLET BY MOUTH EVERY DAY 03/19/18   Vivi Barrack, MD   rosuvastatin (CRESTOR) 10 MG tablet Take 1 tablet (10 mg total) by mouth daily. 03/13/18 03/13/19  Liliane Shi, PA-C    Family History Family History  Problem Relation Age of Onset  . Osteoarthritis Mother   . Skin cancer Mother        ? melanoma  . Heart failure Mother   . CVA Mother 5  . Esophageal cancer Father 33  . Heart attack Father 37       smoker  . CVA Paternal Grandmother   . Diabetes Paternal Grandmother        prediabetic  . Deep vein thrombosis Paternal Grandmother   . Heart attack Maternal Uncle 59  . Cancer Maternal Grandmother        ? GI  . Asthma Son        EIB  . Pancreatic cancer Paternal Uncle   . Colon cancer Neg Hx   . Colon polyps Neg Hx   . Rectal cancer Neg Hx   . Stomach cancer Neg Hx     Social History Social History   Tobacco Use  . Smoking status: Never Smoker  . Smokeless tobacco: Never Used  Substance Use Topics  . Alcohol use: Yes    Comment: extremely rarely  . Drug use: No     Allergies   Lisinopril, Metoprolol, Pravastatin sodium, and Oxycodone   Review of Systems Review of Systems  Constitutional: Negative for chills and fever.  HENT: Negative for congestion and sore throat.   Eyes: Negative for visual disturbance.  Respiratory: Negative for cough, chest tightness and shortness of breath.   Cardiovascular: Negative for chest pain.  Gastrointestinal: Negative for abdominal pain, nausea and vomiting.  Genitourinary: Negative for dysuria, flank pain and frequency.  Musculoskeletal: Positive for arthralgias and back pain. Negative for myalgias.  Skin: Negative for rash.  Neurological: Negative for dizziness, weakness, light-headedness and numbness.     Physical Exam Updated Vital Signs BP (!) 170/85   Pulse 78   Temp 98.7 F (37.1 C) (Oral)   Resp 17   Ht 5\' 4"  (1.626 m)   Wt 61.7 kg   SpO2 100%   BMI 23.34 kg/m   Physical Exam Vitals signs and nursing note reviewed.  Constitutional:      General:  She is not in acute distress.    Appearance: She is well-developed.  HENT:     Head: Normocephalic and atraumatic.  Eyes:     Conjunctiva/sclera: Conjunctivae normal.     Pupils: Pupils are equal, round, and reactive to light.  Neck:     Musculoskeletal: Normal range of motion and neck supple.  Cardiovascular:     Rate and Rhythm: Normal rate  and regular rhythm.     Heart sounds: S1 normal and S2 normal. No murmur.  Pulmonary:     Effort: Pulmonary effort is normal.     Breath sounds: Normal breath sounds. No wheezing or rales.  Abdominal:     General: There is no distension.     Palpations: Abdomen is soft.     Tenderness: There is no abdominal tenderness. There is no guarding.  Musculoskeletal: Normal range of motion.        General: Tenderness present. No deformity.     Comments: Spine Exam: Inspection/Palpation: No midline tenderness of cervical, thoracic, or lumbar spine.  Patient has diffuse left-sided paraspinal muscular tenderness around the iliac crest. Strength: 5/5 throughout LE bilaterally (hip flexion/extension, adduction/abduction; knee flexion/extension; foot dorsiflexion/plantarflexion, inversion/eversion; great toe inversion) Sensation: Intact to light touch in proximal and distal LE bilaterally Reflexes: 3+ left quadriceps and 2+ left achilles reflexes. 2+ RLE throughout   Lymphadenopathy:     Cervical: No cervical adenopathy.  Skin:    General: Skin is warm and dry.     Findings: No erythema or rash.  Neurological:     Mental Status: She is alert.     Comments: Cranial nerves grossly intact. Patient moves extremities symmetrically and with good coordination.  Psychiatric:        Behavior: Behavior normal.        Thought Content: Thought content normal.        Judgment: Judgment normal.      ED Treatments / Results  Labs (all labs ordered are listed, but only abnormal results are displayed) Labs Reviewed  BASIC METABOLIC PANEL - Abnormal; Notable for  the following components:      Result Value   Potassium 3.3 (*)    Glucose, Bld 106 (*)    All other components within normal limits  URINALYSIS, ROUTINE W REFLEX MICROSCOPIC - Abnormal; Notable for the following components:   Ketones, ur 20 (*)    All other components within normal limits  CBC WITH DIFFERENTIAL/PLATELET    EKG EKG Interpretation  Date/Time:  Monday October 15 2018 13:36:45 EDT Ventricular Rate:  66 PR Interval:    QRS Duration: 99 QT Interval:  415 QTC Calculation: 435 R Axis:   13 Text Interpretation:  Sinus rhythm Abnormal R-wave progression, early transition Baseline wander in lead(s) V1 Confirmed by Virgel Manifold [phone]) on 10/15/2018 2:03:23 PM   Radiology Dg Lumbar Spine Complete  Result Date: 10/15/2018 CLINICAL DATA:  Midline to left sided LBP/posterior left hip pain x October 03, 2018 with no known injury, previous lumbar surgery in Sept. 2016 EXAM: LUMBAR SPINE - COMPLETE 4+ VIEW COMPARISON:  05/22/2014 FINDINGS: No fracture.  No spondylolisthesis.  No bone lesion. Moderate levoscoliosis, apex at L2. Moderate loss of disc height with endplate osteophytes and sclerosis at L1-L2 and L2-L3. Mild loss of disc height at L4-L5. Small endplate osteophytes throughout the lumbar spine. Skeletal structures are demineralized. Soft tissues are unremarkable. IMPRESSION: 1. No fracture or acute finding. 2. Degenerative changes and moderate levoscoliosis as described, similar to the prior radiographs. Electronically Signed   By: Lajean Manes M.D.   On: 10/15/2018 14:30   Dg Hip Unilat W Or Wo Pelvis 2-3 Views Left  Result Date: 10/15/2018 CLINICAL DATA:  Low back and left posterior hip pain. No known injury. EXAM: DG HIP (WITH OR WITHOUT PELVIS) 2-3V LEFT COMPARISON:  None. FINDINGS: No fracture or bone lesion. Hip joints normally spaced and aligned. SI joints and symphysis pubis  normally spaced and aligned. Soft tissues are unremarkable. IMPRESSION: No fracture, bone lesion hip  joint abnormality. Electronically Signed   By: Lajean Manes M.D.   On: 10/15/2018 14:31    Procedures Procedures (including critical care time)  Medications Ordered in ED Medications  morphine 2 MG/ML injection 2 mg (has no administration in time range)     Initial Impression / Assessment and Plan / ED Course  I have reviewed the triage vital signs and the nursing notes.  Pertinent labs & imaging results that were available during my care of the patient were reviewed by me and considered in my medical decision making (see chart for details).        Patient denies any concerning symptoms suggestive of cauda equina requiring urgent imaging at this time such as loss of sensation in the lower extremities, lower extremity weakness, loss of bowel or bladder control, saddle anesthesia, urinary retention, fever/chills, IVDU. Exam demonstrated no  weakness on exam today. No preceding injury or trauma to suggest acute fracture. Doubt pelvic or urinary pathology for patient's acute back pain, as patient denies urinary symptoms, has no evidence of infection on UA, has no CVA tenderness, history/pain not consistent with nephrolithiasis. Doubt AAA as cause of patient's back pain as patient lacks major risk factors, had no abdominal TTP, pain is reproduced on exam and has symmetric and intact distal pulses.   Labs obtained given recent episodes of vomiting.  She has slight hypokalemia which was repleted.  Urinalysis without evidence of infection.  CBC unremarkable.  Radiograph of lumbar spine demonstrate stable degenerative changes but no evidence of acute fracture.  Radiograph of left hip without evidence of acute fracture, reviewed by me.  Pain control achieved in emergency department.  Patient ambulated in the emergency department without difficulty and without assistance.  I have reviewed the patient's information in the [facility] for the past 12 months and found  them to have no Rx on record.   Opiates were prescribed for an acute, painful condition. The patient was given information on side effects and encouraged to use other, non-opiate pain medication primary, only using opiate medicine sparingly for severe pain.  Patient given strict return precautions for any symptoms indicating worsening neurologic function in the lower extremities.  This is a shared visit with Dr. Virgel Manifold. Patient was independently evaluated by this attending physician. Attending physician consulted in evaluation and discharge management.  Final Clinical Impressions(s) / ED Diagnoses   Final diagnoses:  Acute left-sided low back pain without sciatica  Disc disease, degenerative, lumbar or lumbosacral    ED Discharge Orders         Ordered    HYDROcodone-acetaminophen (NORCO/VICODIN) 5-325 MG tablet  Every 6 hours PRN     10/15/18 1705    ondansetron (ZOFRAN) 4 MG tablet  Every 6 hours     10/15/18 1705           Tamala Julian 10/15/18 1714    Virgel Manifold, MD 10/19/18 1514

## 2018-10-15 NOTE — Discharge Instructions (Signed)
Please see the information and instructions below regarding your visit.  Your diagnoses today include:  1. Acute left-sided low back pain without sciatica   2. Disc disease, degenerative, lumbar or lumbosacral    About diagnosis. Most episodes of acute low back pain are self-limited. Your exam was reassuring today that the source of your pain is not affecting the spinal cord and nerves that originate in the spinal cord.   If you have a history of disc herniation or arthritis in your spine, the nerves exiting the spine on one side get inflamed. This can cause severe pain. We call this radiculopathy. We do not always know what causes the sudden inflammation.  Tests performed today include: See side panel of your discharge paperwork for testing performed today. Vital signs are listed at the bottom of these instructions.   Medications prescribed:    Take any prescribed medications only as prescribed, and any over the counter medications only as directed on the packaging.  You have been prescribed Norco for pain. This is an opioid pain medication. You may take this medication every 4-6 hours as needed for pain. Only take this medication if you need it for breakthrough pain.   Do not combine this medication with Tylenol, as it may increase the risk of liver problems.  Do not combine this medication with alcohol.  Please be advised to avoid driving or operating heavy machinery while taking this medication, as it may make you drowsy or impair judgment.   You may take Zofran every 6 hours as needed for nausea and vomiting.  Home care instructions:   Low back pain gets worse the longer you stay stationary. Please keep moving and walking as tolerated. There are exercises included in this packet to perform as tolerated for your low back pain.   Apply heat to the areas that are painful. Avoid twisting or bending your trunk to lift something. Do not lift anything above 25 lbs while recovering from  this flare of low back pain.  Please follow any educational materials contained in this packet.   Follow-up instructions: Please follow-up with your primary care provider in one week for further evaluation of your symptoms if they are not completely improved. I recommend discussing physical therapy with your primary care provider if this pain persists.    Return instructions:  Please return to the Emergency Department if you experience worsening symptoms.  Please return for any fever or chills in the setting of your back pain, weakness in the muscles of the legs, numbness in your legs and feet that is new or changing, numbness in the area where you wipe, retention of your urine, loss of bowel or bladder control, or problems with walking. Please return if you have any other emergent concerns.  Additional Information:   Your vital signs today were: BP (!) 152/102    Pulse 63    Temp 98.7 F (37.1 C) (Oral)    Resp 15    Ht 5\' 4"  (1.626 m)    Wt 61.7 kg    SpO2 98%    BMI 23.34 kg/m  If your blood pressure (BP) was elevated on multiple readings during this visit above 130 for the top number or above 80 for the bottom number, please have this repeated by your primary care provider within one month. --------------  Thank you for allowing [place] to participate in your care today.

## 2018-10-15 NOTE — ED Notes (Signed)
Pt ambulated well in hallway. Pt able to ambulate with minimal pain. Pt steady gait with guard assist by this RN.

## 2018-10-22 ENCOUNTER — Other Ambulatory Visit: Payer: Self-pay

## 2018-10-22 ENCOUNTER — Ambulatory Visit (INDEPENDENT_AMBULATORY_CARE_PROVIDER_SITE_OTHER): Payer: PPO | Admitting: Physician Assistant

## 2018-10-22 ENCOUNTER — Telehealth: Payer: Self-pay | Admitting: Internal Medicine

## 2018-10-22 ENCOUNTER — Encounter (HOSPITAL_COMMUNITY): Payer: Self-pay | Admitting: Emergency Medicine

## 2018-10-22 ENCOUNTER — Encounter (HOSPITAL_BASED_OUTPATIENT_CLINIC_OR_DEPARTMENT_OTHER): Payer: Self-pay | Admitting: *Deleted

## 2018-10-22 ENCOUNTER — Emergency Department (HOSPITAL_BASED_OUTPATIENT_CLINIC_OR_DEPARTMENT_OTHER): Payer: PPO

## 2018-10-22 ENCOUNTER — Telehealth: Payer: Self-pay | Admitting: Family Medicine

## 2018-10-22 ENCOUNTER — Emergency Department (HOSPITAL_COMMUNITY)
Admission: EM | Admit: 2018-10-22 | Discharge: 2018-10-22 | Discharge status: Against Medical Advice | Payer: PPO | Attending: Emergency Medicine | Admitting: Emergency Medicine

## 2018-10-22 ENCOUNTER — Emergency Department (HOSPITAL_BASED_OUTPATIENT_CLINIC_OR_DEPARTMENT_OTHER)
Admission: EM | Admit: 2018-10-22 | Discharge: 2018-10-23 | Disposition: A | Discharge status: Home / Self Care | Payer: PPO | Source: Home / Self Care | Attending: Emergency Medicine | Admitting: Emergency Medicine

## 2018-10-22 ENCOUNTER — Encounter: Payer: Self-pay | Admitting: Physician Assistant

## 2018-10-22 VITALS — BP 150/80 | HR 95 | Temp 99.0°F

## 2018-10-22 DIAGNOSIS — K5732 Diverticulitis of large intestine without perforation or abscess without bleeding: Secondary | ICD-10-CM | POA: Insufficient documentation

## 2018-10-22 DIAGNOSIS — M5416 Radiculopathy, lumbar region: Secondary | ICD-10-CM | POA: Diagnosis not present

## 2018-10-22 DIAGNOSIS — Z7982 Long term (current) use of aspirin: Secondary | ICD-10-CM | POA: Insufficient documentation

## 2018-10-22 DIAGNOSIS — Z79899 Other long term (current) drug therapy: Secondary | ICD-10-CM | POA: Insufficient documentation

## 2018-10-22 DIAGNOSIS — R1032 Left lower quadrant pain: Secondary | ICD-10-CM

## 2018-10-22 DIAGNOSIS — R1114 Bilious vomiting: Secondary | ICD-10-CM

## 2018-10-22 DIAGNOSIS — R1030 Lower abdominal pain, unspecified: Secondary | ICD-10-CM | POA: Diagnosis present

## 2018-10-22 DIAGNOSIS — K5792 Diverticulitis of intestine, part unspecified, without perforation or abscess without bleeding: Secondary | ICD-10-CM | POA: Diagnosis not present

## 2018-10-22 DIAGNOSIS — R112 Nausea with vomiting, unspecified: Secondary | ICD-10-CM

## 2018-10-22 DIAGNOSIS — M5489 Other dorsalgia: Secondary | ICD-10-CM | POA: Insufficient documentation

## 2018-10-22 LAB — CBC WITH DIFFERENTIAL/PLATELET
Abs Immature Granulocytes: 0.09 10*3/uL — ABNORMAL HIGH (ref 0.00–0.07)
Basophils Absolute: 0 10*3/uL (ref 0.0–0.1)
Basophils Relative: 0 %
Eosinophils Absolute: 0 10*3/uL (ref 0.0–0.5)
Eosinophils Relative: 0 %
HCT: 42.6 % (ref 36.0–46.0)
Hemoglobin: 13.6 g/dL (ref 12.0–15.0)
Immature Granulocytes: 0 %
Lymphocytes Relative: 8 %
Lymphs Abs: 1.8 10*3/uL (ref 0.7–4.0)
MCH: 30 pg (ref 26.0–34.0)
MCHC: 31.9 g/dL (ref 30.0–36.0)
MCV: 93.8 fL (ref 80.0–100.0)
Monocytes Absolute: 1.2 10*3/uL — ABNORMAL HIGH (ref 0.1–1.0)
Monocytes Relative: 5 %
Neutro Abs: 19.1 10*3/uL — ABNORMAL HIGH (ref 1.7–7.7)
Neutrophils Relative %: 87 %
Platelets: 280 10*3/uL (ref 150–400)
RBC: 4.54 MIL/uL (ref 3.87–5.11)
RDW: 13.1 % (ref 11.5–15.5)
WBC: 22.1 10*3/uL — ABNORMAL HIGH (ref 4.0–10.5)
nRBC: 0 % (ref 0.0–0.2)

## 2018-10-22 LAB — URINALYSIS, MICROSCOPIC (REFLEX): Bacteria, UA: NONE SEEN

## 2018-10-22 LAB — URINALYSIS, ROUTINE W REFLEX MICROSCOPIC
Bilirubin Urine: NEGATIVE
Glucose, UA: NEGATIVE mg/dL
Ketones, ur: 15 mg/dL — AB
Leukocytes,Ua: NEGATIVE
Nitrite: NEGATIVE
Protein, ur: NEGATIVE mg/dL
Specific Gravity, Urine: <1.005 — ABNORMAL LOW (ref 1.005–1.030)
pH: 6.5 (ref 5.0–8.0)

## 2018-10-22 LAB — COMPREHENSIVE METABOLIC PANEL
ALT: 10 U/L (ref 0–44)
AST: 24 U/L (ref 15–41)
Albumin: 4.4 g/dL (ref 3.5–5.0)
Alkaline Phosphatase: 60 U/L (ref 38–126)
Anion gap: 12 (ref 5–15)
BUN: 18 mg/dL (ref 8–23)
CO2: 23 mmol/L (ref 22–32)
Calcium: 9.2 mg/dL (ref 8.9–10.3)
Chloride: 103 mmol/L (ref 98–111)
Creatinine, Ser: 0.85 mg/dL (ref 0.44–1.00)
GFR calc Af Amer: >60 mL/min (ref >60)
GFR calc non Af Amer: >60 mL/min (ref >60)
Glucose, Bld: 134 mg/dL — ABNORMAL HIGH (ref 70–99)
Potassium: 4 mmol/L (ref 3.5–5.1)
Sodium: 138 mmol/L (ref 135–145)
Total Bilirubin: 0.7 mg/dL (ref 0.3–1.2)
Total Protein: 7.3 g/dL (ref 6.5–8.1)

## 2018-10-22 LAB — LIPASE, BLOOD: Lipase: 29 U/L (ref 11–51)

## 2018-10-22 MED ORDER — ONDANSETRON HCL 4 MG/2ML IJ SOLN
4.0000 mg | Freq: Once | INTRAMUSCULAR | Status: AC
  Administered 2018-10-22: 4 mg via INTRAVENOUS
  Filled 2018-10-22: qty 2

## 2018-10-22 MED ORDER — AMOXICILLIN-POT CLAVULANATE 875-125 MG PO TABS
1.0000 | ORAL_TABLET | Freq: Once | ORAL | Status: AC
  Administered 2018-10-22: 23:00:00 1 via ORAL
  Filled 2018-10-22: qty 1

## 2018-10-22 MED ORDER — HYDROCODONE-ACETAMINOPHEN 5-325 MG PO TABS: 1.0000 | ORAL_TABLET | ORAL | 0 refills | Status: DC | PRN

## 2018-10-22 MED ORDER — AMOXICILLIN-POT CLAVULANATE 875-125 MG PO TABS: 1.0000 | ORAL_TABLET | Freq: Two times a day (BID) | ORAL | 0 refills | Status: DC

## 2018-10-22 MED ORDER — ONDANSETRON HCL 4 MG/2ML IJ SOLN
4.0000 mg | INTRAMUSCULAR | Status: AC
  Administered 2018-10-22: 4 mg via INTRAMUSCULAR

## 2018-10-22 MED ORDER — IOHEXOL 300 MG/ML  SOLN
100.0000 mL | Freq: Once | INTRAMUSCULAR | Status: AC | PRN
  Administered 2018-10-22: 22:00:00 100 mL via INTRAVENOUS

## 2018-10-22 MED ORDER — FENTANYL CITRATE (PF) 100 MCG/2ML IJ SOLN
50.0000 ug | Freq: Once | INTRAMUSCULAR | Status: AC
  Administered 2018-10-22: 50 ug via INTRAVENOUS
  Filled 2018-10-22: qty 2

## 2018-10-22 NOTE — Telephone Encounter (Signed)
Patient was seen today by Aldona Bar. Advised to go to the ED for Possible Diverticulitis , Back Pain , Abdominal Pain.  ED has a 7 hour wait. The patient is requesting medication for Possible Diverticulitis , Back Pain , Abdominal Pain.  Patient said that she can return back to the office. Patient is requesting antibiotic by shot because she has been vomiting everything.  Please advise CB- [phone] Preferred [facility]

## 2018-10-22 NOTE — Discharge Instructions (Addendum)
Take the antibiotics as prescribed.  Follow-up with your doctor as needed if your symptoms are not improving.  Return to the emergency room if you have any worsening pain, vomiting, fevers or other worsening symptoms.

## 2018-10-22 NOTE — Progress Notes (Signed)
[name] is a 78 y.o. female here for a follow up of a pre-existing problem.  I acted as a Education administrator for [employer], PA-C Anselmo Pickler, LPN  History of Present Illness:   Chief Complaint  Patient presents with  . Back Pain    HPI   Back pain Patient is s/p laminectomy in 2016. She was doing strenuous activity in late June and pain started shortly after. Went to her neurosurgeon's office around 7/8 and was given oral prednisone and muscle relaxers, told that she likely had arthritis. She had vomiting from the oral prednisone that subsided after stopping the medication. She went to the ER on 7/13 and was found to be slightly hypokalemic, this was replaced. Xray of back showed stable degenerative changes. She was given Norco.  After coming home from the ER that night patient started Norco. She had new onset of sharp shooting abdominal pain with this. After taking the medication for a few days she felt like it was constipating her. She took laxatives without relief. She denies fever/chills. She started having nausea and vomiting today, with severe L-sided abdominal pain. She has hx of diverticulosis but not diverticulitis. She tells me her abdominal pain is 10/10.   Past Medical History:  Diagnosis Date  . Allergy   . Anemia   . Arthritis   . CAD (coronary artery disease)    Dr Aundra Dubin  . Cancer (HCC)    BCC nose  . Cataract    both eyes- surgically  removed  . Diverticulosis of colon   . GERD (gastroesophageal reflux disease)   . Herpes zoster    1997 L flank  . Hyperlipidemia    intol. to statins in past  . Osteopenia    Dr Radene Knee, Gynecology  . Osteopenia      Social History   Socioeconomic History  . Marital status: Married    Spouse name: Not on file  . Number of children: 4  . Years of education: Not on file  . Highest education level: Not on file  Occupational History  . Not on file  Social Needs  . Financial resource strain: Not on file  . Food  insecurity    Worry: Not on file    Inability: Not on file  . Transportation needs    Medical: Not on file    Non-medical: Not on file  Tobacco Use  . Smoking status: Never Smoker  . Smokeless tobacco: Never Used  Substance and Sexual Activity  . Alcohol use: Yes    Comment: extremely rarely  . Drug use: No  . Sexual activity: Not on file  Lifestyle  . Physical activity    Days per week: Not on file    Minutes per session: Not on file  . Stress: Not on file  Relationships  . Social Herbalist on phone: Not on file    Gets together: Not on file    Attends religious service: Not on file    Active member of club or organization: Not on file    Attends meetings of clubs or organizations: Not on file    Relationship status: Not on file  . Intimate partner violence    Fear of current or ex partner: Not on file    Emotionally abused: Not on file    Physically abused: Not on file    Forced sexual activity: Not on file  Other Topics Concern  . Not on file  Social History  Narrative   Exercise: hand weight, crunches, some walking    Past Surgical History:  Procedure Laterality Date  . CATARACT EXTRACTION  2010   [facility]  , Dr. Herbert Deaner  . CATARACT EXTRACTION W/ INTRAOCULAR LENS IMPLANT Left 02/2014   Vision not significantly improved  . COLONOSCOPY  2007   [facility]  . CORONARY ANGIOPLASTY WITH STENT PLACEMENT  06/19/2010   2 vessel; Dr Burt Knack  . LUMBAR DISC SURGERY  11/03/2014  . VARICOSE VEIN SURGERY  1971    Family History  Problem Relation Age of Onset  . Osteoarthritis Mother   . Skin cancer Mother        ? melanoma  . Heart failure Mother   . CVA Mother 46  . Esophageal cancer Father 13  . Heart attack Father 73       smoker  . CVA Paternal Grandmother   . Diabetes Paternal Grandmother        prediabetic  . Deep vein thrombosis Paternal Grandmother   . Heart attack Maternal Uncle 59  . Cancer Maternal Grandmother        ? GI  . Asthma Son         EIB  . Pancreatic cancer Paternal Uncle   . Colon cancer Neg Hx   . Colon polyps Neg Hx   . Rectal cancer Neg Hx   . Stomach cancer Neg Hx     Allergies  Allergen Reactions  . Lisinopril     Diffuse itching w/o rash or fever  . Metoprolol     Shortness of breath  . Pravastatin Sodium     REACTION: muscle pain in arms 2009  . Oxycodone Nausea And Vomiting    Current Medications:   Current Outpatient Medications:  .  aspirin 81 MG tablet, Take 81 mg by mouth daily.  , Disp: , Rfl:  .  Black Currant Seed Oil 500 MG CAPS, Take 1 capsule by mouth daily. , Disp: , Rfl:  .  Calcium Citrate-Vitamin D (CITRUS CALCIUM 1500 + D PO), Take 1 capsule by mouth daily. , Disp: , Rfl:  .  Coenzyme Q10 (COQ10) 200 MG CAPS, Take 1 capsule by mouth daily. , Disp: , Rfl:  .  CRANBERRY-CALCIUM PO, Take 1 capsule by mouth daily., Disp: , Rfl:  .  Gelatin 600 MG CAPS, Take 1 capsule by mouth daily. , Disp: , Rfl:  .  Multiple Vitamin (MULTIVITAMIN) capsule, Take 1 capsule by mouth daily.  , Disp: , Rfl:  .  nitroGLYCERIN (NITROSTAT) 0.4 MG SL tablet, Place 0.4 mg under the tongue every 5 (five) minutes as needed for chest pain. , Disp: , Rfl:  .  ondansetron (ZOFRAN) 4 MG tablet, Take 1 tablet (4 mg total) by mouth every 6 (six) hours., Disp: 12 tablet, Rfl: 0 .  rosuvastatin (CRESTOR) 10 MG tablet, Take 1 tablet (10 mg total) by mouth daily., Disp: 90 tablet, Rfl: 3 .  HYDROcodone-acetaminophen (NORCO/VICODIN) 5-325 MG tablet, Take 1 tablet by mouth every 6 (six) hours as needed. (Patient not taking: Reported on 10/22/2018), Disp: 12 tablet, Rfl: 0   Review of Systems:   ROS  Negative unless otherwise specified per HPI.   Vitals:   Vitals:   10/22/18 1340  BP: (!) 150/80  Pulse: 95  Temp: 99 F (37.2 C)  TempSrc: Oral  SpO2: 98%     There is no height or weight on file to calculate BMI.  Physical Exam:   Physical Exam Vitals  signs and nursing note reviewed.  Constitutional:       General: She is not in acute distress.    Appearance: She is well-developed. She is not ill-appearing or toxic-appearing.  Cardiovascular:     Rate and Rhythm: Regular rhythm. Tachycardia present.     Pulses: Normal pulses.     Heart sounds: Normal heart sounds, S1 normal and S2 normal.     Comments: No LE edema Pulmonary:     Effort: Pulmonary effort is normal.     Breath sounds: Normal breath sounds.  Abdominal:     General: Abdomen is flat. Bowel sounds are decreased.     Palpations: Abdomen is soft.     Tenderness: There is abdominal tenderness in the left lower quadrant. There is guarding. There is no right CVA tenderness, left CVA tenderness or rebound.  Skin:    General: Skin is warm and dry.  Neurological:     Mental Status: She is alert.     GCS: GCS eye subscore is 4. GCS verbal subscore is 5. GCS motor subscore is 6.  Psychiatric:        Speech: Speech normal.        Behavior: Behavior normal. Behavior is cooperative.     Assessment and Plan:   Kristyana was seen today for back pain.  Diagnoses and all orders for this visit:  Left lower quadrant abdominal pain Pain is uncontrolled. She is vomiting from the pain and also is unable to take any medications prescribed for her pain. Zofran IM given in office. Suspect possible diverticulitis however cannot rule out other etiology of pain. Due to severity of symptoms and lack of pain control, I recommended patient go to the ER for further evaluation and treatment. She verbalized understanding. She states that her husband can drive her, declined EMS.  Bilious vomiting with nausea -     ondansetron (ZOFRAN) injection 4 mg  Left lumbar radiculopathy Uncontrolled. Will reach out to Dr. Peter Garter office to see what the status on her transferring to their office.  . Reviewed expectations re: course of current medical issues. . Discussed self-management of symptoms. . Outlined signs and symptoms indicating need for more acute  intervention. . Patient verbalized understanding and all questions were answered. . See orders for this visit as documented in the electronic medical record. . Patient received an After-Visit Summary.  CMA or LPN served as scribe during this visit. History, Physical, and Plan performed by medical provider. The above documentation has been reviewed and is accurate and complete.   Inda Coke, PA-C

## 2018-10-22 NOTE — ED Notes (Signed)
Pt states that she is leaving because she doesn't want to stay due to the wait time.

## 2018-10-22 NOTE — ED Notes (Signed)
Patient transported to CT 

## 2018-10-22 NOTE — ED Triage Notes (Signed)
Pt reports she went to her PCP sent her to the ER for possible diverticulitis, lower abd pains, and back pain. Pt reports multiple visits for the back pain and states same is not getting any better. The diverticulitis and lower abd pains are something new she is having.

## 2018-10-22 NOTE — Telephone Encounter (Signed)
Patient reports that she was seen by her PCP for severe back pain and abdominal pain.  She was told to go to the ED for eval and for imaging.  They suspected possible diverticulitis but felt her pain was not able to be controlled. She went to the ED and the wait was too long at [facility] and [facility] so she went home.  She was advised by PCP again to return to the ED.  Patient called here asking for help.  She is advised that she needs to be evaluated in the ED, that at this time no appts are available this week.  Patient does not want to wait in the ED.  She is advised that she can try [facility] ED.  She verbalized understanding to return to the ED for evaluation.

## 2018-10-22 NOTE — ED Notes (Signed)
Pt tolerated po fluids  

## 2018-10-22 NOTE — ED Notes (Signed)
Pt amb to BR; unable to provide urine sample

## 2018-10-22 NOTE — Patient Instructions (Signed)
It was great to see you!  Please go to the ER immediately.  I will have my staff contact Dr. Poole's/Elsner's office to try to get the ball rolling.  Take care,  Inda Coke PA-C

## 2018-10-22 NOTE — ED Provider Notes (Signed)
[place] EMERGENCY DEPARTMENT Provider Note   CSN: [id number] Arrival date & time: 10/22/18  1759    History   Chief Complaint Chief Complaint  Patient presents with   Abdominal Pain    HPI [name] is a 78 y.o. female.     Patient is a 78 year old female who presents with abdominal pain and back pain.  She has some chronic back pain and is status post laminectomy in 2016.  In June she was doing some activity and exacerbated her pain.  Since that time she has had increasing pain to her right lower back.  She has seen her neurosurgeon who felt that it was likely related to arthritis and was started on prednisone and muscle relaxers.  She states that she started vomiting and was attributed initially the prednisone.  However over the last few days she has had some pain in her lower abdomen, mostly on the left side and above her bladder.  She has had some nausea and vomiting.  No diarrhea.  She does complain of some constipation.  No urinary symptoms.  No known fevers.  Her back pain is in her right lower back.  It does not radiate down her legs.  No numbness or weakness to her extremities.  No loss of bowel or bladder control.  She was seen by her PCP today and advised to come to the emergency room.     Past Medical History:  Diagnosis Date   Allergy    Anemia    Arthritis    CAD (coronary artery disease)    Dr Aundra Dubin   Cancer [facility])    [place] nose   Cataract    both eyes- surgically  removed   Diverticulosis of colon    GERD (gastroesophageal reflux disease)    Herpes zoster    1997 L flank   Hyperlipidemia    intol. to statins in past   Osteopenia    Dr Radene Knee, Gynecology   Osteopenia     Patient Active Problem List   Diagnosis Date Noted   Scoliosis 08/08/2016   Lumbar stenosis 08/08/2016   Varicose vein of leg 08/08/2016   Osteoarthritis, hand 08/07/2015   Left lumbar radiculopathy 07/03/2014   Bursitis of left hip 02/21/2013    Sacroiliac dysfunction 02/21/2013   Malignant basal cell neoplasm of skin 06/25/2012   GERD (gastroesophageal reflux disease) 05/30/2011   CAD (coronary artery disease) 06/17/2010   Pain in joint 05/21/2009   Diverticulosis of large intestine 05/19/2008   Hyperlipidemia 05/15/2007   Osteopenia 05/10/2007    Past Surgical History:  Procedure Laterality Date   CATARACT EXTRACTION  2010   [facility]  , Dr. Herbert Deaner   CATARACT EXTRACTION W/ INTRAOCULAR LENS IMPLANT Left 02/2014   Vision not significantly improved   COLONOSCOPY  2007   [facility]   CORONARY ANGIOPLASTY WITH STENT PLACEMENT  06/19/2010   2 vessel; Dr Burt Knack   LUMBAR DISC SURGERY  11/03/2014   [facility]     OB History   No obstetric history on file.      Home Medications    Prior to Admission medications   Medication Sig Start Date End Date Taking? Authorizing Provider  amoxicillin-clavulanate (AUGMENTIN) 875-125 MG tablet Take 1 tablet by mouth 2 (two) times daily. One po bid x 7 days 10/22/18   Malvin Johns, MD  aspirin 81 MG tablet Take 81 mg by mouth daily.      [provider]  Black Currant Seed Oil 500 MG CAPS Take 1 capsule by mouth daily.     [provider]  Calcium Citrate-Vitamin D (CITRUS CALCIUM 1500 + D PO) Take 1 capsule by mouth daily.     [provider]  Coenzyme Q10 (COQ10) 200 MG CAPS Take 1 capsule by mouth daily.     [provider]  CRANBERRY-CALCIUM PO Take 1 capsule by mouth daily.    [provider]  Gelatin 600 MG CAPS Take 1 capsule by mouth daily.     [provider]  HYDROcodone-acetaminophen (NORCO/VICODIN) 5-325 MG tablet Take 1-2 tablets by mouth every 4 (four) hours as needed. 10/22/18   Malvin Johns, MD  Multiple Vitamin (MULTIVITAMIN) capsule Take 1 capsule by mouth daily.      [provider]  nitroGLYCERIN (NITROSTAT) 0.4 MG SL tablet Place 0.4 mg under the tongue every 5 (five) minutes as  needed for chest pain.     [provider]  ondansetron (ZOFRAN) 4 MG tablet Take 1 tablet (4 mg total) by mouth every 6 (six) hours. 10/15/18   Langston Masker B, PA-C  rosuvastatin (CRESTOR) 10 MG tablet Take 1 tablet (10 mg total) by mouth daily. 03/13/18 03/13/19  Liliane Shi, PA-C    Family History Family History  Problem Relation Age of Onset   Osteoarthritis Mother    Skin cancer Mother        ? melanoma   Heart failure Mother    CVA Mother 22   Esophageal cancer Father 14   Heart attack Father 11       smoker   CVA Paternal Grandmother    Diabetes Paternal Grandmother        prediabetic   Deep vein thrombosis Paternal Grandmother    Heart attack Maternal Uncle 74   Cancer Maternal Grandmother        ? GI   Asthma Son        EIB   Pancreatic cancer Paternal Uncle    Colon cancer Neg Hx    Colon polyps Neg Hx    Rectal cancer Neg Hx    Stomach cancer Neg Hx     Social History Social History   Tobacco Use   Smoking status: Never Smoker   Smokeless tobacco: Never Used  Substance Use Topics   Alcohol use: Yes    Comment: extremely rarely   Drug use: No     Allergies   Lisinopril, Metoprolol, Pravastatin sodium, and Oxycodone   Review of Systems Review of Systems  Constitutional: Negative for chills, diaphoresis, fatigue and fever.  HENT: Negative for congestion, rhinorrhea and sneezing.   Eyes: Negative.   Respiratory: Negative for cough, chest tightness and shortness of breath.   Cardiovascular: Negative for chest pain and leg swelling.  Gastrointestinal: Positive for abdominal pain, constipation, nausea and vomiting. Negative for blood in stool and diarrhea.  Genitourinary: Negative for difficulty urinating, flank pain, frequency and hematuria.  Musculoskeletal: Positive for back pain. Negative for arthralgias.  Skin: Negative for rash.  Neurological: Negative for dizziness, speech difficulty, weakness, numbness and  headaches.     Physical Exam Updated Vital Signs BP (!) 157/79 (BP Location: Left Arm)    Pulse 86    Temp 98.5 F (36.9 C) (Oral)    Resp 18    Ht 5\' 4"  (1.626 m)    Wt 61 kg    SpO2 99%    BMI 23.08 kg/m   Physical Exam Constitutional:  Appearance: She is well-developed.  HENT:     Head: Normocephalic and atraumatic.  Eyes:     Pupils: Pupils are equal, round, and reactive to light.  Neck:     Musculoskeletal: Normal range of motion and neck supple.  Cardiovascular:     Rate and Rhythm: Normal rate and regular rhythm.     Heart sounds: Normal heart sounds.  Pulmonary:     Effort: Pulmonary effort is normal. No respiratory distress.     Breath sounds: Normal breath sounds. No wheezing or rales.  Chest:     Chest wall: No tenderness.  Abdominal:     General: Bowel sounds are normal.     Palpations: Abdomen is soft.     Tenderness: There is abdominal tenderness in the suprapubic area and left lower quadrant. There is no guarding or rebound.  Musculoskeletal: Normal range of motion.     Comments: Positive tenderness in the right lower lumbar area.  No spinal tenderness.  Negative straight leg raise bilaterally.  She has normal sensation and motor function distally.  Pedal pulses are intact.  Lymphadenopathy:     Cervical: No cervical adenopathy.  Skin:    General: Skin is warm and dry.     Findings: No rash.  Neurological:     Mental Status: She is alert and oriented to person, place, and time.      ED Treatments / Results  Labs (all labs ordered are listed, but only abnormal results are displayed) Labs Reviewed  CBC WITH DIFFERENTIAL/PLATELET - Abnormal; Notable for the following components:      Result Value   WBC 22.1 (*)    Neutro Abs 19.1 (*)    Monocytes Absolute 1.2 (*)    Abs Immature Granulocytes 0.09 (*)    All other components within normal limits  COMPREHENSIVE METABOLIC PANEL - Abnormal; Notable for the following components:   Glucose, Bld 134  (*)    All other components within normal limits  LIPASE, BLOOD  URINALYSIS, ROUTINE W REFLEX MICROSCOPIC    EKG None  Radiology Ct Abdomen Pelvis W Contrast  Result Date: 10/22/2018 CLINICAL DATA:  Left lower quadrant abdominal pain for 2 days EXAM: CT ABDOMEN AND PELVIS WITH CONTRAST TECHNIQUE: Multidetector CT imaging of the abdomen and pelvis was performed using the standard protocol following bolus administration of intravenous contrast. CONTRAST:  134mL OMNIPAQUE IOHEXOL 300 MG/ML  SOLN COMPARISON:  None. FINDINGS: Lower chest: Lung bases are within normal limits. Mild pericardial fluid is seen. Hepatobiliary: No focal liver abnormality is seen. No gallstones, gallbladder wall thickening, or biliary dilatation. Pancreas: Unremarkable. No pancreatic ductal dilatation or surrounding inflammatory changes. Spleen: Normal in size without focal abnormality. Adrenals/Urinary Tract: Adrenal glands are within normal limits. Kidneys demonstrate a normal enhancement pattern bilaterally. Scattered small cysts are noted. No renal calculi are noted. No obstructive changes are seen. The bladder is partially distended. Stomach/Bowel: Colon demonstrates evidence of diverticular change with focal diverticulitis at the junction of the descending and sigmoid colons. No perforation or abscess is identified. The appendix is within normal limits. Small bowel is well visualized and within normal limits with the exception of a prominent duodenal diverticulum. Stomach is unremarkable. Vascular/Lymphatic: Aortic atherosclerosis. No enlarged abdominal or pelvic lymph nodes. Reproductive: Uterus is well visualized and within normal limits. Bilateral ovarian cysts are noted which appears simple in nature. Other: No abdominal wall hernia or abnormality. No abdominopelvic ascites. Musculoskeletal: Degenerative changes of lumbar spine are noted. IMPRESSION: Changes of diverticulitis at the  junction of the descending and sigmoid  colons. Electronically Signed   By: Inez Catalina M.D.   On: 10/22/2018 22:32    Procedures Procedures (including critical care time)  Medications Ordered in ED Medications  amoxicillin-clavulanate (AUGMENTIN) 875-125 MG per tablet 1 tablet (has no administration in time range)  iohexol (OMNIPAQUE) 300 MG/ML solution 100 mL (100 mLs Intravenous Contrast Given 10/22/18 2157)  fentaNYL (SUBLIMAZE) injection 50 mcg (50 mcg Intravenous Given 10/22/18 2247)  ondansetron (ZOFRAN) injection 4 mg (4 mg Intravenous Given 10/22/18 2246)     Initial Impression / Assessment and Plan / ED Course  I have reviewed the triage vital signs and the nursing notes.  Pertinent labs & imaging results that were available during my care of the patient were reviewed by me and considered in my medical decision making (see chart for details).        Patient is a 78 year old female who presents with back pain which is been ongoing for the last month.  She has no neurologic deficits.  No signs of cauda equina.  She came in today because she started having some pain across her abdomen associate with some nausea and vomiting.  CT scan shows evidence of diverticulitis.  There is no perforation or abscess.  She is otherwise well-appearing.  No fever.  Her white count is elevated at 22 but her other labs are non-concerning.  She was given a dose of antibiotics as well as pain medications.  She will need a p.o. trial and if she tolerates this, she can be discharged home with outpatient treatment.  She was given strict return precautions.  Final Clinical Impressions(s) / ED Diagnoses   Final diagnoses:  Diverticulitis    ED Discharge Orders         Ordered    amoxicillin-clavulanate (AUGMENTIN) 875-125 MG tablet  2 times daily     10/22/18 2300    HYDROcodone-acetaminophen (NORCO/VICODIN) 5-325 MG tablet  Every 4 hours PRN     10/22/18 2300           Malvin Johns, MD 10/22/18 2302

## 2018-10-22 NOTE — Telephone Encounter (Signed)
See note

## 2018-10-22 NOTE — ED Triage Notes (Signed)
Pt c/o left lower adb pain x 2 days HX diverticulitis

## 2018-10-22 NOTE — Telephone Encounter (Signed)
Spoke to pt, told her unfortunately you need to go to back to the ED to be evaluated and have x-rays we can not give you anything per Aldona Bar. I suggested she try [facility] wait time should be shorter. Pt verbalized understanding.

## 2018-10-22 NOTE — Telephone Encounter (Signed)
Pt reported that she is having a diverticulitis flare up and requested an appt ASAP.  She stated that she is experiencing back pain possibly related to her medications.  Please advise scheduling.

## 2018-10-23 ENCOUNTER — Telehealth: Payer: Self-pay | Admitting: Internal Medicine

## 2018-10-23 ENCOUNTER — Telehealth: Payer: Self-pay | Admitting: Gastroenterology

## 2018-10-23 ENCOUNTER — Telehealth: Payer: Self-pay | Admitting: Physical Therapy

## 2018-10-23 ENCOUNTER — Other Ambulatory Visit: Payer: Self-pay | Admitting: Physician Assistant

## 2018-10-23 DIAGNOSIS — K5792 Diverticulitis of intestine, part unspecified, without perforation or abscess without bleeding: Secondary | ICD-10-CM

## 2018-10-23 MED ORDER — ONDANSETRON HCL 4 MG PO TABS: 4.0000 mg | ORAL_TABLET | Freq: Four times a day (QID) | ORAL | 0 refills | Status: DC | PRN

## 2018-10-23 NOTE — Telephone Encounter (Signed)
Spoke to pt told her referral has already been placed to [facility] and she will put in referral to Dr. Fuller Plan GI at this time per [facility]. Someone will be in touch with you to schedule appointments. Pt verbalized understanding.

## 2018-10-23 NOTE — Telephone Encounter (Signed)
Copied from [place] [id number]. Topic: Referral - Request for Referral >> Oct 23, 2018 10:29 AM Scherrie Gerlach wrote: Pt seen yesterday and advised to go to ED, which she did. Pt would like referral to a GI doctor. Pt would like to see Dr Audrie Lia she has seen him for colonoscopy  Pt also would like referral to a back dr for her back pain. She has seen neurosurgery for back surgery in the past, but he has moved to [place]. She would like to discuss with samantha who would the appropriate type of back dr be.  Marland Kitchen

## 2018-10-23 NOTE — Telephone Encounter (Signed)
Fine with me

## 2018-10-23 NOTE — Telephone Encounter (Signed)
Please see message and advise 

## 2018-10-23 NOTE — Telephone Encounter (Signed)
Referral has already been placed to [facility].  I will put in referral to Dr Fuller Plan at this time.  Someone will be in touch with her regarding these appointments.

## 2018-10-23 NOTE — Telephone Encounter (Signed)
ED eval and CT confirned diverticulitis  I think PCP f/u is fine unless she does not get better

## 2018-10-23 NOTE — ED Provider Notes (Signed)
Care assumed from Dr. Tamera Punt, patient with diverticulitis by CT scan and elevated WBC but generally nontoxic in appearance, given antibiotics here in given a p.o. challenge to make sure she can take fluids at home.  She was able to drink fluids without difficulty.  She continues to be nontoxic in appearance and seems appropriate for home management.  Return precautions have been discussed.   Delora Fuel, MD [birth year] [phone]

## 2018-10-24 NOTE — Telephone Encounter (Signed)
OK 

## 2018-10-25 DIAGNOSIS — R030 Elevated blood-pressure reading, without diagnosis of hypertension: Secondary | ICD-10-CM | POA: Diagnosis not present

## 2018-10-25 DIAGNOSIS — M4150 Other secondary scoliosis, site unspecified: Secondary | ICD-10-CM | POA: Diagnosis not present

## 2018-10-30 ENCOUNTER — Encounter: Payer: PPO | Admitting: Family Medicine

## 2018-11-07 ENCOUNTER — Encounter: Payer: Self-pay | Admitting: Gastroenterology

## 2018-11-07 ENCOUNTER — Ambulatory Visit: Payer: PPO | Admitting: Gastroenterology

## 2018-11-07 ENCOUNTER — Other Ambulatory Visit (INDEPENDENT_AMBULATORY_CARE_PROVIDER_SITE_OTHER): Payer: PPO

## 2018-11-07 VITALS — BP 110/70 | HR 74 | Ht 64.0 in | Wt 129.0 lb

## 2018-11-07 DIAGNOSIS — K5732 Diverticulitis of large intestine without perforation or abscess without bleeding: Secondary | ICD-10-CM | POA: Diagnosis not present

## 2018-11-07 DIAGNOSIS — R1032 Left lower quadrant pain: Secondary | ICD-10-CM | POA: Diagnosis not present

## 2018-11-07 LAB — COMPREHENSIVE METABOLIC PANEL
ALT: 5 U/L (ref 0–35)
AST: 15 U/L (ref 0–37)
Albumin: 4.4 g/dL (ref 3.5–5.2)
Alkaline Phosphatase: 52 U/L (ref 39–117)
BUN: 10 mg/dL (ref 6–23)
CO2: 26 mEq/L (ref 19–32)
Calcium: 9.2 mg/dL (ref 8.4–10.5)
Chloride: 107 mEq/L (ref 96–112)
Creatinine, Ser: 0.77 mg/dL (ref 0.40–1.20)
GFR: 72.64 mL/min (ref >60.00)
Glucose, Bld: 105 mg/dL — ABNORMAL HIGH (ref 70–99)
Potassium: 3.7 mEq/L (ref 3.5–5.1)
Sodium: 140 mEq/L (ref 135–145)
Total Bilirubin: 0.3 mg/dL (ref 0.2–1.2)
Total Protein: 6.8 g/dL (ref 6.0–8.3)

## 2018-11-07 LAB — CBC WITH DIFFERENTIAL/PLATELET
Basophils Absolute: 0 10*3/uL (ref 0.0–0.1)
Basophils Relative: 0.3 % (ref 0.0–3.0)
Eosinophils Absolute: 0.1 10*3/uL (ref 0.0–0.7)
Eosinophils Relative: 2.3 % (ref 0.0–5.0)
HCT: 38.8 % (ref 36.0–46.0)
Hemoglobin: 12.8 g/dL (ref 12.0–15.0)
Lymphocytes Relative: 30.1 % (ref 12.0–46.0)
Lymphs Abs: 1.6 10*3/uL (ref 0.7–4.0)
MCHC: 33.1 g/dL (ref 30.0–36.0)
MCV: 91.4 fl (ref 78.0–100.0)
Monocytes Absolute: 0.5 10*3/uL (ref 0.1–1.0)
Monocytes Relative: 9.5 % (ref 3.0–12.0)
Neutro Abs: 3.1 10*3/uL (ref 1.4–7.7)
Neutrophils Relative %: 57.8 % (ref 43.0–77.0)
Platelets: 291 10*3/uL (ref 150.0–400.0)
RBC: 4.24 Mil/uL (ref 3.87–5.11)
RDW: 14.2 % (ref 11.5–15.5)
WBC: 5.3 10*3/uL (ref 4.0–10.5)

## 2018-11-07 NOTE — Progress Notes (Signed)
History of Present Illness: This is a 78 year old female with left lower quadrant pain.  She relates a history of scoliosis, spinal stenosis and underwent lumbar surgery in 2016 in [place].  She states on July 1 she developed severe recurrent back pain with radiation across her left and right back into her lower flanks.  On July 20 she developed left lower quadrant pain and was evaluated at the [facility] ED. uncomplicated diverticulitis was diagnosed by CT AP.  She was treated with a 7-day course of Augmentin and had substantial improvement in symptoms.  She has persistent, mild left lower quadrant discomfort along with her ongoing bilateral lumbar back pain that radiates into her lower flanks.  She self treated with a liquid diet for the past 7 days and relates a several pound weight loss along with fatigue.  No fevers, chills, bowel habit changes.  Colonoscopy performed last year by Dr. Carlean Purl as below.  Denies constipation, diarrhea, change in stool caliber, melena, hematochezia, nausea, vomiting, dysphagia, reflux symptoms, chest pain.   Colonoscopy to cecum, good prep, 06/2017 - Diverticulosis in the sigmoid colon. - The examination was otherwise normal on direct and retroflexion views.   Allergies  Allergen Reactions  . Lisinopril     Diffuse itching w/o rash or fever  . Metoprolol     Shortness of breath  . Pravastatin Sodium     REACTION: muscle pain in arms 2009  . Oxycodone Nausea And Vomiting   Outpatient Medications Prior to Visit  Medication Sig Dispense Refill  . aspirin 81 MG tablet Take 81 mg by mouth daily.      . Black Currant Seed Oil 500 MG CAPS Take 1 capsule by mouth daily.     . Calcium Citrate-Vitamin D (CITRUS CALCIUM 1500 + D PO) Take 1 capsule by mouth daily.     . Coenzyme Q10 (COQ10) 200 MG CAPS Take 1 capsule by mouth daily.     Marland Kitchen CRANBERRY-CALCIUM PO Take 1 capsule by mouth daily.    . Gelatin 600 MG CAPS Take 1 capsule by mouth daily.     .  Multiple Vitamin (MULTIVITAMIN) capsule Take 1 capsule by mouth daily.      . nitroGLYCERIN (NITROSTAT) 0.4 MG SL tablet Place 0.4 mg under the tongue every 5 (five) minutes as needed for chest pain.     Marland Kitchen ondansetron (ZOFRAN) 4 MG tablet Take 1 tablet (4 mg total) by mouth every 6 (six) hours. 12 tablet 0  . rosuvastatin (CRESTOR) 10 MG tablet Take 1 tablet (10 mg total) by mouth daily. 90 tablet 3  . amoxicillin-clavulanate (AUGMENTIN) 875-125 MG tablet Take 1 tablet by mouth 2 (two) times daily. One po bid x 7 days (Patient not taking: Reported on 11/07/2018) 14 tablet 0  . HYDROcodone-acetaminophen (NORCO/VICODIN) 5-325 MG tablet Take 1-2 tablets by mouth every 4 (four) hours as needed. (Patient not taking: Reported on 11/07/2018) 15 tablet 0  . ondansetron (ZOFRAN) 4 MG tablet Take 1 tablet (4 mg total) by mouth every 6 (six) hours as needed for nausea or vomiting. (Patient not taking: Reported on 11/07/2018) 12 tablet 0   No facility-administered medications prior to visit.    Past Medical History:  Diagnosis Date  . Allergy   . Anemia   . Arthritis   . CAD (coronary artery disease)    Dr Aundra Dubin  . Cancer (HCC)    BCC nose  . Cataract    both eyes- surgically  removed  .  Diverticulosis of colon   . GERD (gastroesophageal reflux disease)   . Herpes zoster    1997 L flank  . Hyperlipidemia    intol. to statins in past  . Osteopenia    Dr Radene Knee, Gynecology  . Osteopenia    Past Surgical History:  Procedure Laterality Date  . CATARACT EXTRACTION  2010   OD  , Dr. Herbert Deaner  . CATARACT EXTRACTION W/ INTRAOCULAR LENS IMPLANT Left 02/2014   Vision not significantly improved  . COLONOSCOPY  2007   [facility]  . CORONARY ANGIOPLASTY WITH STENT PLACEMENT  06/19/2010   2 vessel; Dr Burt Knack  . LUMBAR DISC SURGERY  11/03/2014  . [place]   Social History   Socioeconomic History  . Marital status: Married    Spouse name: Not on file  . Number of children: 4  .  Years of education: Not on file  . Highest education level: Not on file  Occupational History  . Not on file  Social Needs  . Financial resource strain: Not on file  . Food insecurity    Worry: Not on file    Inability: Not on file  . Transportation needs    Medical: Not on file    Non-medical: Not on file  Tobacco Use  . Smoking status: Never Smoker  . Smokeless tobacco: Never Used  Substance and Sexual Activity  . Alcohol use: Yes    Comment: extremely rarely  . Drug use: No  . Sexual activity: Not on file  Lifestyle  . Physical activity    Days per week: Not on file    Minutes per session: Not on file  . Stress: Not on file  Relationships  . Social Herbalist on phone: Not on file    Gets together: Not on file    Attends religious service: Not on file    Active member of club or organization: Not on file    Attends meetings of clubs or organizations: Not on file    Relationship status: Not on file  Other Topics Concern  . Not on file  Social History Narrative   Exercise: hand weight, crunches, some walking   Family History  Problem Relation Age of Onset  . Osteoarthritis Mother   . Skin cancer Mother        ? melanoma  . Heart failure Mother   . CVA Mother 40  . Esophageal cancer Father 52  . Heart attack Father 65       smoker  . CVA Paternal Grandmother   . Diabetes Paternal Grandmother        prediabetic  . Deep vein thrombosis Paternal Grandmother   . Heart attack Maternal Uncle 59  . Cancer Maternal Grandmother        ? GI  . Asthma Son        EIB  . Pancreatic cancer Paternal Uncle   . Colon cancer Neg Hx   . Colon polyps Neg Hx   . Rectal cancer Neg Hx   . Stomach cancer Neg Hx       Physical Exam: General: Well developed, well nourished, no acute distress Head: Normocephalic and atraumatic Eyes:  sclerae anicteric, EOMI Ears: Normal auditory acuity Mouth: No deformity or lesions Lungs: Clear throughout to auscultation Heart:  Regular rate and rhythm; no murmurs, rubs or bruits Abdomen: Soft, non tender and non distended. No masses, hepatosplenomegaly or hernias noted. Normal Bowel sounds Rectal: Not done  Musculoskeletal: Symmetrical with no gross deformities  Pulses:  Normal pulses noted Extremities: No clubbing, cyanosis, edema or deformities noted Neurological: Alert oriented x 4, grossly nonfocal Psychological:  Alert and cooperative. Normal mood and affect   Assessment and Recommendations:  1. LLQ and lumbar back pain.  Uncomplicated diverticulitis, resolved with a course of Augmentin. R/O persistent diverticulitis, abscess however this is unlikely.  I suspect her left lower quadrant pain is referred from her back.  She is advised to resume a regular diet with a moderate fiber intake.  CBC, CMP today. Schedule CT AP.  Follow-up with her neurosurgeon, Dr. Annette Stable.

## 2018-11-07 NOTE — Patient Instructions (Signed)
Your provider has requested that you go to the basement level for lab work before leaving today. Press "B" on the elevator. The lab is located at the first door on the left as you exit the elevator.  You have been scheduled for a CT scan of the abdomen and pelvis at [facility] (Radiology)  You are scheduled on 11/13/18 at [place] should arrive 15 minutes prior to your appointment time for registration. Please follow the written instructions below on the day of your exam:  WARNING: IF YOU ARE ALLERGIC TO IODINE/X-RAY DYE, PLEASE NOTIFY RADIOLOGY IMMEDIATELY AT [phone]! YOU WILL BE GIVEN A 13 HOUR PREMEDICATION PREP.  1) Do not eat or drink anything after 4:00am (4 hours prior to your test) 2) You have been given 2 bottles of oral contrast to drink. The solution may taste better if refrigerated, but do NOT add ice or any other liquid to this solution. Shake well before drinking.    Drink 1 bottle of contrast @ 6:00am (2 hours prior to your exam)  Drink 1 bottle of contrast @ 7:00am (1 hour prior to your exam)  You may take any medications as prescribed with a small amount of water, if necessary. If you take any of the following medications: METFORMIN, GLUCOPHAGE, GLUCOVANCE, AVANDAMET, RIOMET, FORTAMET, [place]en MET, JANUMET, GLUMETZA or METAGLIP, you MAY be asked to HOLD this medication 48 hours AFTER the exam.  The purpose of you drinking the oral contrast is to aid in the visualization of your intestinal tract. The contrast solution may cause some diarrhea. Depending on your individual set of symptoms, you may also receive an intravenous injection of x-ray contrast/dye.   This test typically takes 30-45 minutes to complete.  If you have any questions regarding your exam or if you need to reschedule, you may call the CT department at [phone] between the hours of 8:00 am and 5:00 pm,  Monday-Friday.  __________________________________________________________________  Thank you for choosing me and [facility].  Pricilla Riffle. Dagoberto Ligas., MD., Marval Regal

## 2018-11-08 ENCOUNTER — Telehealth: Payer: Self-pay | Admitting: Family Medicine

## 2018-11-08 NOTE — Telephone Encounter (Signed)
Patient had a comment on the patient survey that stated "it has been extremely hard to get appointments with the proper specialists in a timely manner considering the chronic pain since July 1." I see that he has a CT appointment scheduled for 11/13/2018.   Please contact patient to inquire if another referral was needed or if anything additional was needed at this time for his care and pain.

## 2018-11-09 NOTE — Telephone Encounter (Signed)
Could you please check on referral to Neurosurgery that was placed by [facility]?    Thanks!

## 2018-11-12 NOTE — Telephone Encounter (Signed)
This was sent 7/20.  I am at the [facility] office today, if someone can follow up with [facility], I would appreciate it - [phone].  If they didn't get it for some reason, let me know so I can resend. Thanks.

## 2018-11-12 NOTE — Telephone Encounter (Signed)
Patient had a new patient appt on 07/23 and has a upcoming Appt in a week.

## 2018-11-13 ENCOUNTER — Encounter (HOSPITAL_COMMUNITY): Payer: Self-pay

## 2018-11-13 ENCOUNTER — Ambulatory Visit (HOSPITAL_COMMUNITY)
Admission: RE | Admit: 2018-11-13 | Discharge: 2018-11-13 | Disposition: A | Discharge status: Home / Self Care | Payer: PPO | Source: Ambulatory Visit | Attending: Gastroenterology | Admitting: Gastroenterology

## 2018-11-13 ENCOUNTER — Other Ambulatory Visit: Payer: Self-pay

## 2018-11-13 DIAGNOSIS — R1032 Left lower quadrant pain: Secondary | ICD-10-CM | POA: Insufficient documentation

## 2018-11-13 DIAGNOSIS — K6389 Other specified diseases of intestine: Secondary | ICD-10-CM | POA: Diagnosis not present

## 2018-11-13 DIAGNOSIS — K5732 Diverticulitis of large intestine without perforation or abscess without bleeding: Secondary | ICD-10-CM | POA: Diagnosis not present

## 2018-11-13 MED ORDER — SODIUM CHLORIDE (PF) 0.9 % IJ SOLN
INTRAMUSCULAR | Status: AC
  Filled 2018-11-13: qty 50

## 2018-11-13 MED ORDER — IOHEXOL 300 MG/ML  SOLN
100.0000 mL | Freq: Once | INTRAMUSCULAR | Status: AC | PRN
  Administered 2018-11-13: 100 mL via INTRAVENOUS

## 2018-11-14 ENCOUNTER — Telehealth: Payer: Self-pay | Admitting: Family Medicine

## 2018-11-14 NOTE — Telephone Encounter (Signed)
I called the patient to schedule AWV with Courtney, but there was no answer and no option to leave a message. VDM (Dee-Dee) °

## 2018-11-15 ENCOUNTER — Other Ambulatory Visit: Payer: Self-pay

## 2018-11-15 DIAGNOSIS — K529 Noninfective gastroenteritis and colitis, unspecified: Secondary | ICD-10-CM

## 2018-11-15 DIAGNOSIS — R1032 Left lower quadrant pain: Secondary | ICD-10-CM

## 2018-12-03 ENCOUNTER — Other Ambulatory Visit: Payer: PPO

## 2018-12-06 ENCOUNTER — Telehealth: Payer: Self-pay | Admitting: Gastroenterology

## 2018-12-06 NOTE — Telephone Encounter (Signed)
Pt will come p/u contrast tomorrow, she is requesting the transparent contrast instead of the milky like one. She said that she can tolerate the transparent one better.

## 2018-12-06 NOTE — Telephone Encounter (Signed)
I explained to the patient that she will have to have the REDICAT in order to see.  She understands and will come pick up the contrast and instructions tomorrow.

## 2018-12-17 ENCOUNTER — Other Ambulatory Visit: Payer: Self-pay

## 2018-12-17 ENCOUNTER — Ambulatory Visit (INDEPENDENT_AMBULATORY_CARE_PROVIDER_SITE_OTHER)
Admission: RE | Admit: 2018-12-17 | Discharge: 2018-12-17 | Disposition: A | Discharge status: Home / Self Care | Payer: PPO | Source: Ambulatory Visit | Attending: Gastroenterology | Admitting: Gastroenterology

## 2018-12-17 DIAGNOSIS — R1032 Left lower quadrant pain: Secondary | ICD-10-CM

## 2018-12-17 DIAGNOSIS — K529 Noninfective gastroenteritis and colitis, unspecified: Secondary | ICD-10-CM

## 2018-12-17 DIAGNOSIS — K5792 Diverticulitis of intestine, part unspecified, without perforation or abscess without bleeding: Secondary | ICD-10-CM | POA: Diagnosis not present

## 2018-12-17 MED ORDER — IOHEXOL 300 MG/ML  SOLN
100.0000 mL | Freq: Once | INTRAMUSCULAR | Status: AC | PRN
  Administered 2018-12-17: 100 mL via INTRAVENOUS

## 2019-01-01 DIAGNOSIS — Z6822 Body mass index (BMI) 22.0-22.9, adult: Secondary | ICD-10-CM | POA: Diagnosis not present

## 2019-01-01 DIAGNOSIS — Z1231 Encounter for screening mammogram for malignant neoplasm of breast: Secondary | ICD-10-CM | POA: Diagnosis not present

## 2019-01-01 DIAGNOSIS — Z01419 Encounter for gynecological examination (general) (routine) without abnormal findings: Secondary | ICD-10-CM | POA: Diagnosis not present

## 2019-01-01 DIAGNOSIS — N958 Other specified menopausal and perimenopausal disorders: Secondary | ICD-10-CM | POA: Diagnosis not present

## 2019-01-01 DIAGNOSIS — M8588 Other specified disorders of bone density and structure, other site: Secondary | ICD-10-CM | POA: Diagnosis not present

## 2019-01-08 ENCOUNTER — Encounter: Payer: PPO | Admitting: Family Medicine

## 2019-01-21 ENCOUNTER — Other Ambulatory Visit: Payer: Self-pay

## 2019-01-21 ENCOUNTER — Ambulatory Visit (INDEPENDENT_AMBULATORY_CARE_PROVIDER_SITE_OTHER): Payer: PPO | Admitting: Gastroenterology

## 2019-01-21 ENCOUNTER — Encounter: Payer: Self-pay | Admitting: Gastroenterology

## 2019-01-21 VITALS — BP 122/68 | HR 72 | Temp 97.0°F | Ht 64.0 in | Wt 132.4 lb

## 2019-01-21 DIAGNOSIS — K219 Gastro-esophageal reflux disease without esophagitis: Secondary | ICD-10-CM

## 2019-01-21 MED ORDER — FAMOTIDINE 40 MG PO TABS: 40.0000 mg | ORAL_TABLET | Freq: Every day | ORAL | 11 refills | Status: DC

## 2019-01-21 NOTE — Patient Instructions (Signed)
We have sent the following medications to your pharmacy for you to pick up at your convenience:  Pepcid  You have been given some reflux precaution information.  You may also use bed blocks.  You have been scheduled for an endoscopy. Please follow written instructions given to you at your visit today. If you use inhalers (even only as needed), please bring them with you on the day of your procedure.

## 2019-01-21 NOTE — Progress Notes (Signed)
    History of Present Illness: This is a 78 year old female who relates many years of reflux symptoms.  She notes episodes of nocturnal reflux and regurgitation about once or twice per month for many years.  She has tried modifying her diet, eating earlier in the evening however her symptoms persist.  She was treated with ranitidine hs for a number of years which did not seem to improve symptoms.  She discontinued ranitidine and 2018.  No prior EGD.  She relates that her father died of esophageal cancer and her brother has a precancerous esophageal disorder.  She has had resolution of her left-sided pain following retreatment for diverticulitis in August.  Denies weight loss, abdominal pain, constipation, diarrhea, change in stool caliber, melena, hematochezia, nausea, vomiting, dysphagia, chest pain.   Current Medications, Allergies, Past Medical History, Past Surgical History, Family History and Social History were reviewed in [employer] record.   Physical Exam: General: Well developed, well nourished, no acute distress Head: Normocephalic and atraumatic Eyes:  sclerae anicteric, EOMI Ears: Normal auditory acuity Mouth: No deformity or lesions Lungs: Clear throughout to auscultation Heart: Regular rate and rhythm; no murmurs, rubs or bruits Abdomen: Soft, non tender and non distended. No masses, hepatosplenomegaly or hernias noted. Normal Bowel sounds Rectal: Not done Musculoskeletal: Symmetrical with no gross deformities  Pulses:  Normal pulses noted Extremities: No clubbing, cyanosis, edema or deformities noted Neurological: Alert oriented x 4, grossly nonfocal Psychological:  Alert and cooperative. Normal mood and affect   Assessment and Recommendations:  1. GERD.  Rule out esophagitis, Barrett's.  Follow all standard antireflux measures, begin the use of 4 inch bed blocks, begin famotidine 40 mg at bedtime.  Schedule EGD. The risks (including bleeding,  perforation, infection, missed lesions, medication reactions and possible hospitalization or surgery if complications occur), benefits, and alternatives to endoscopy with possible biopsy and possible dilation were discussed with the patient and they consent to proceed.   2. Diverticulitis, resolved.  Diverticulosis.  Long term high-fiber diet with adequate daily fluid intake.

## 2019-01-22 ENCOUNTER — Encounter: Payer: Self-pay | Admitting: Gastroenterology

## 2019-01-23 ENCOUNTER — Telehealth: Payer: Self-pay | Admitting: Gastroenterology

## 2019-01-23 NOTE — Telephone Encounter (Signed)

## 2019-01-24 ENCOUNTER — Encounter: Payer: Self-pay | Admitting: Gastroenterology

## 2019-01-24 ENCOUNTER — Other Ambulatory Visit: Payer: Self-pay

## 2019-01-24 ENCOUNTER — Ambulatory Visit (AMBULATORY_SURGERY_CENTER): Payer: PPO | Admitting: Gastroenterology

## 2019-01-24 VITALS — BP 127/70 | HR 68 | Temp 98.7°F | Resp 14 | Ht 64.0 in | Wt 132.0 lb

## 2019-01-24 DIAGNOSIS — K2100 Gastro-esophageal reflux disease with esophagitis, without bleeding: Secondary | ICD-10-CM | POA: Diagnosis not present

## 2019-01-24 DIAGNOSIS — K219 Gastro-esophageal reflux disease without esophagitis: Secondary | ICD-10-CM

## 2019-01-24 DIAGNOSIS — K5710 Diverticulosis of small intestine without perforation or abscess without bleeding: Secondary | ICD-10-CM | POA: Diagnosis not present

## 2019-01-24 MED ORDER — SODIUM CHLORIDE 0.9 % IV SOLN: 500.0000 mL | Freq: Once | INTRAVENOUS | Status: DC

## 2019-01-24 MED ORDER — PANTOPRAZOLE SODIUM 40 MG PO TBEC: 40.0000 mg | DELAYED_RELEASE_TABLET | Freq: Every day | ORAL | 3 refills | Status: DC

## 2019-01-24 NOTE — Op Note (Signed)
[employer] Patient Name: [name] Procedure Date: 01/24/2019 8:26 AM MRN: [id number] Endoscopist: Ladene Artist , MD Age: 78 Referring MD:  Date of Birth: [birth year] Gender: Female Account #: [id number] Procedure:                Upper GI endoscopy Indications:              Screening for Barrett's esophagus, Gastroesophageal                            reflux disease Medicines:                Monitored Anesthesia Care Procedure:                Pre-Anesthesia Assessment:                           - Prior to the procedure, a History and Physical                            was performed, and patient medications and                            allergies were reviewed. The patient's tolerance of                            previous anesthesia was also reviewed. The risks                            and benefits of the procedure and the sedation                            options and risks were discussed with the patient.                            All questions were answered, and informed consent                            was obtained. Prior Anticoagulants: The patient has                            taken no previous anticoagulant or antiplatelet                            agents. ASA Grade Assessment: II - A patient with                            mild systemic disease. After reviewing the risks                            and benefits, the patient was deemed in                            satisfactory condition to undergo the procedure.  After obtaining informed consent, the endoscope was                            passed under direct vision. Throughout the                            procedure, the patient's blood pressure, pulse, and                            oxygen saturations were monitored continuously. The                            Endoscope was introduced through the mouth, and                            advanced to the second part of duodenum.  The upper                            GI endoscopy was accomplished without difficulty.                            The patient tolerated the procedure well. Scope In: Scope Out: Findings:                 LA Grade A (one or more mucosal breaks less than 5                            mm, not extending between tops of 2 mucosal folds)                            esophagitis with no bleeding was found at the                            gastroesophageal junction.                           The exam of the esophagus was otherwise normal.                           The entire examined stomach was normal.                           A 10 mm non-bleeding diverticulum was found in the                            area of the papilla.                           The exam of the duodenum was otherwise normal. Complications:            No immediate complications. Estimated Blood Loss:     Estimated blood loss: none. Impression:               - LA Grade A reflux esophagitis.                           -  Normal stomach.                           - Non-bleeding duodenal diverticulum.                           - No specimens collected. Recommendation:           - Patient has a contact number available for                            emergencies. The signs and symptoms of potential                            delayed complications were discussed with the                            patient. Return to normal activities tomorrow.                            Written discharge instructions were provided to the                            patient.                           - Resume previous diet.                           - Follow antireflux meausres.                           - Continue present medications.                           - Protonix (pantoprazole) 40 mg PO daily, refills                            for 1 year.                           - Return to GI office in 2 months. Ladene Artist, MD 01/24/2019 8:42:07  AM This report has been signed electronically.

## 2019-01-24 NOTE — Patient Instructions (Signed)
Handout given for esophagitis Start medication Protonix 40mg  daily    YOU HAD AN ENDOSCOPIC PROCEDURE TODAY AT [facility]:   Refer to the procedure report that was given to you for any specific questions about what was found during the examination.  If the procedure report does not answer your questions, please call your gastroenterologist to clarify.  If you requested that your care partner not be given the details of your procedure findings, then the procedure report has been included in a sealed envelope for you to review at your convenience later.  YOU SHOULD EXPECT: Some feelings of bloating in the abdomen. Passage of more gas than usual.  Walking can help get rid of the air that was put into your GI tract during the procedure and reduce the bloating. If you had a lower endoscopy (such as a colonoscopy or flexible sigmoidoscopy) you may notice spotting of blood in your stool or on the toilet paper. If you underwent a bowel prep for your procedure, you may not have a normal bowel movement for a few days.  Please Note:  You might notice some irritation and congestion in your nose or some drainage.  This is from the oxygen used during your procedure.  There is no need for concern and it should clear up in a day or so.  SYMPTOMS TO REPORT IMMEDIATELY:    Following upper endoscopy (EGD)  Vomiting of blood or coffee ground material  New chest pain or pain under the shoulder blades  Painful or persistently difficult swallowing  New shortness of breath  Fever of 100F or higher  Black, tarry-looking stools  For urgent or emergent issues, a gastroenterologist can be reached at any hour by calling [phone].   DIET:  We do recommend a small meal at first, but then you may proceed to your regular diet.  Drink plenty of fluids but you should avoid alcoholic beverages for 24 hours.  ACTIVITY:  You should plan to take it easy for the rest of today and you should NOT DRIVE or  use heavy machinery until tomorrow (because of the sedation medicines used during the test).    FOLLOW UP: Our staff will call the number listed on your records 48-72 hours following your procedure to check on you and address any questions or concerns that you may have regarding the information given to you following your procedure. If we do not reach you, we will leave a message.  We will attempt to reach you two times.  During this call, we will ask if you have developed any symptoms of COVID 19. If you develop any symptoms (ie: fever, flu-like symptoms, shortness of breath, cough etc.) before then, please call [phone].  If you test positive for Covid 19 in the 2 weeks post procedure, please call and report this information to [place].    If any biopsies were taken you will be contacted by phone or by letter within the next 1-3 weeks.  Please call us at [phone] if you have not heard about the biopsies in 3 weeks.    SIGNATURES/CONFIDENTIALITY: You and/or your care partner have signed paperwork which will be entered into your electronic medical record.  These signatures attest to the fact that that the information above on your After Visit Summary has been reviewed and is understood.  Full responsibility of the confidentiality of this discharge information lies with you and/or your care-partner.

## 2019-01-24 NOTE — Progress Notes (Signed)
A and O x3. Report to RN. Tolerated MAC anesthesia well.Teeth unchanged after procedure.

## 2019-01-28 ENCOUNTER — Telehealth: Payer: Self-pay | Admitting: *Deleted

## 2019-01-28 NOTE — Telephone Encounter (Signed)
Second follow up call attempt. Left message on voicemail to call with questions or concerns.

## 2019-01-28 NOTE — Telephone Encounter (Signed)
Left message on f/u call 

## 2019-02-04 DIAGNOSIS — I2510 Atherosclerotic heart disease of native coronary artery without angina pectoris: Secondary | ICD-10-CM | POA: Diagnosis not present

## 2019-02-04 DIAGNOSIS — C44310 Basal cell carcinoma of skin of unspecified parts of face: Secondary | ICD-10-CM | POA: Diagnosis not present

## 2019-02-18 DIAGNOSIS — N83209 Unspecified ovarian cyst, unspecified side: Secondary | ICD-10-CM | POA: Diagnosis not present

## 2019-03-05 DIAGNOSIS — C44310 Basal cell carcinoma of skin of unspecified parts of face: Secondary | ICD-10-CM | POA: Diagnosis not present

## 2019-03-05 DIAGNOSIS — M419 Scoliosis, unspecified: Secondary | ICD-10-CM | POA: Diagnosis not present

## 2019-03-05 DIAGNOSIS — I2510 Atherosclerotic heart disease of native coronary artery without angina pectoris: Secondary | ICD-10-CM | POA: Diagnosis not present

## 2019-03-15 ENCOUNTER — Encounter: Payer: Self-pay | Admitting: Cardiovascular Disease

## 2019-03-15 ENCOUNTER — Other Ambulatory Visit: Payer: Self-pay

## 2019-03-15 ENCOUNTER — Ambulatory Visit: Payer: PPO | Admitting: Cardiovascular Disease

## 2019-03-15 VITALS — BP 112/60 | HR 91 | Ht 64.0 in | Wt 134.4 lb

## 2019-03-15 DIAGNOSIS — E782 Mixed hyperlipidemia: Secondary | ICD-10-CM | POA: Diagnosis not present

## 2019-03-15 DIAGNOSIS — I10 Essential (primary) hypertension: Secondary | ICD-10-CM | POA: Diagnosis not present

## 2019-03-15 DIAGNOSIS — I2510 Atherosclerotic heart disease of native coronary artery without angina pectoris: Secondary | ICD-10-CM | POA: Diagnosis not present

## 2019-03-15 DIAGNOSIS — I313 Pericardial effusion (noninflammatory): Secondary | ICD-10-CM

## 2019-03-15 DIAGNOSIS — I3139 Other pericardial effusion (noninflammatory): Secondary | ICD-10-CM

## 2019-03-15 NOTE — Progress Notes (Signed)
Cardiology Office Note:    Date:  03/15/2019   ID:  [name], DOB 02/16/1941, MRN [id number]  PCP:  Vivi Barrack, MD  Cardiologist:  Sherren Mocha, MD  Electrophysiologist:  None   Referring MD: Vivi Barrack, MD   Chief Complaint  Patient presents with   Coronary Artery Disease    History of Present Illness:    [name] is a 78 y.o. female with a hx of coronary artery disease, presenting for follow-up evaluation.  She underwent stenting of the LAD and right coronary arteries in 2012 using drug-eluting stents.  She is here alone today. Reports that over the summer she was limited by severe back pain. She has a hx of back surgery for scoliosis and spinal stenosis.  She has occasional shortness of breath with activity but nothing on a regular basis.  She denies chest pain.  She denies orthopnea, PND, lightheadedness, palpitations, or syncope.  She has some concerns about a CT scan that she had to evaluate diverticulitis.  This demonstrated aortic atherosclerosis and a trace pericardial effusion.  Past Medical History:  Diagnosis Date   Allergy    Anemia    Arthritis    CAD (coronary artery disease)    Dr Aundra Dubin   Cancer [facility])    Stanton nose   Cataract    both eyes- surgically  removed   Diverticulosis of colon    GERD (gastroesophageal reflux disease)    Herpes zoster    1997 L flank   Hyperlipidemia    intol. to statins in past   Osteopenia    Dr Radene Knee, Gynecology   Osteopenia     Past Surgical History:  Procedure Laterality Date   CATARACT EXTRACTION  2010   OD  , Dr. Herbert Deaner   CATARACT EXTRACTION W/ INTRAOCULAR LENS IMPLANT Left 02/2014   Vision not significantly improved   COLONOSCOPY  2007   [facility]   CORONARY ANGIOPLASTY WITH STENT PLACEMENT  06/19/2010   2 vessel; Dr Burt Knack   LUMBAR DISC SURGERY  11/03/2014   VARICOSE VEIN SURGERY  1971    Current Medications: Current Meds  Medication Sig   aspirin 81 MG tablet  Take 81 mg by mouth daily.     Black Currant Seed Oil 500 MG CAPS Take 1 capsule by mouth daily.    Calcium Citrate-Vitamin D (CITRUS CALCIUM 1500 + D PO) Take 1 capsule by mouth daily.    Coenzyme Q10 (COQ10) 200 MG CAPS Take 1 capsule by mouth daily.    CRANBERRY-CALCIUM PO Take 1 capsule by mouth daily.   famotidine (PEPCID) 40 MG tablet Take 1 tablet (40 mg total) by mouth at bedtime.   Gelatin 600 MG CAPS Take 1 capsule by mouth daily.    Influenza Vac High-Dose Quad (FLUZONE HIGH-DOSE QUADRIVALENT) 0.7 ML SUSY Fluzone High-Dose Quad 2020-21 (PF) 240 mcg/0.7 mL IM syringe  PHARMACY ADMINISTERED   Multiple Vitamin (MULTIVITAMIN) capsule Take 1 capsule by mouth daily.     nitroGLYCERIN (NITROSTAT) 0.4 MG SL tablet Place 0.4 mg under the tongue every 5 (five) minutes as needed for chest pain.    ondansetron (ZOFRAN) 4 MG tablet Take 1 tablet (4 mg total) by mouth every 6 (six) hours.   pantoprazole (PROTONIX) 40 MG tablet Take 1 tablet (40 mg total) by mouth daily.   rosuvastatin (CRESTOR) 10 MG tablet Take 1 tablet (10 mg total) by mouth daily.   Current Facility-Administered Medications for the 03/15/19 encounter (Office Visit)  with Sherren Mocha, MD  Medication   0.9 %  sodium chloride infusion     Allergies:   Lisinopril, Metoprolol, Pravastatin sodium, and Oxycodone   Social History   Socioeconomic History   Marital status: Married    Spouse name: Not on file   Number of children: 4   Years of education: Not on file   Highest education level: Not on file  Occupational History   Not on file  Tobacco Use   Smoking status: Never Smoker   Smokeless tobacco: Never Used  Substance and Sexual Activity   Alcohol use: Yes    Comment: extremely rarely   Drug use: No   Sexual activity: Not on file  Other Topics Concern   Not on file  Social History Narrative   Exercise: hand weight, crunches, some walking   Social Determinants of Health   Financial  Resource Strain:    Difficulty of Paying Living Expenses: Not on file  Food Insecurity:    Worried About Running Out of Food in the Last Year: Not on file   Ran Out of Food in the Last Year: Not on file  Transportation Needs:    Lack of Transportation (Medical): Not on file   Lack of Transportation (Non-Medical): Not on file  Physical Activity:    Days of Exercise per Week: Not on file   Minutes of Exercise per Session: Not on file  Stress:    Feeling of Stress : Not on file  Social Connections:    Frequency of Communication with Friends and Family: Not on file   Frequency of Social Gatherings with Friends and Family: Not on file   Attends Religious Services: Not on file   Active Member of Clubs or Organizations: Not on file   Attends Archivist Meetings: Not on file   Marital Status: Not on file     Family History: The patient's family history includes Asthma in her son; CVA in her paternal grandmother; CVA (age of onset: 17) in her mother; Cancer in her maternal grandmother; Deep vein thrombosis in her paternal grandmother; Diabetes in her paternal grandmother; Esophageal cancer (age of onset: 31) in her father; Heart attack (age of onset: 57) in her maternal uncle; Heart attack (age of onset: 67) in her father; Heart failure in her mother; Osteoarthritis in her mother; Pancreatic cancer in her paternal uncle; Skin cancer in her mother. There is no history of Colon cancer, Colon polyps, Rectal cancer, or Stomach cancer.  ROS:   Please see the history of present illness.    All other systems reviewed and are negative.  EKGs/Labs/Other Studies Reviewed:    The following studies were reviewed today: CT abdomen/pelvis images are reviewed with the patient today.  EKG:  EKG is not ordered today.   Recent Labs: 11/07/2018: ALT 5; BUN 10; Creatinine, Ser 0.77; Hemoglobin 12.8; Platelets 291.0; Potassium 3.7; Sodium 140  Recent Lipid Panel    Component Value  Date/Time   CHOL 165 08/10/2017 0928   TRIG 101.0 08/10/2017 0928   TRIG 75 02/08/2006 1223   HDL 59.50 08/10/2017 0928   CHOLHDL 3 08/10/2017 0928   VLDL 20.2 08/10/2017 0928   LDLCALC 86 08/10/2017 0928   LDLDIRECT 158.8 05/24/2010 1013    Physical Exam:    VS:  BP 112/60    Pulse 91    Ht 5\' 4"  (1.626 m)    Wt 134 lb 6.4 oz (61 kg)    SpO2 99%  BMI 23.07 kg/m     Wt Readings from Last 3 Encounters:  03/15/19 134 lb 6.4 oz (61 kg)  01/24/19 132 lb (59.9 kg)  01/21/19 132 lb 6 oz (60 kg)     GEN:  Well nourished, well developed in no acute distress HEENT: Normal NECK: No JVD; No carotid bruits LYMPHATICS: No lymphadenopathy CARDIAC: RRR, no murmurs, rubs, gallops RESPIRATORY:  Clear to auscultation without rales, wheezing or rhonchi  ABDOMEN: Soft, non-tender, non-distended MUSCULOSKELETAL:  No edema; No deformity  SKIN: Warm and dry NEUROLOGIC:  Alert and oriented x 3 PSYCHIATRIC:  Normal affect   ASSESSMENT:    1. Coronary artery disease involving native coronary artery of native heart without angina pectoris   2. Essential hypertension   3. Mixed hyperlipidemia   4. Pericardial effusion    PLAN:    In order of problems listed above:  1. The patient is stable without symptoms of angina.  She continues on aspirin for antiplatelet therapy.  She is tolerating a low-dose statin drug with dosing limited by myalgias. 2. Blood pressure is currently well controlled.  Not requiring pharmacotherapy. 3. Reviewed most recent lipids from May 2019.  LDL cholesterol was above goal of 70 mg/dL.  She has continued on low-dose Crestor with symptoms limited by side effects.  We will update her lipid panel today. 4. The patient had a trace anterior pericardial effusion seen on CT scan incidentally.  I reviewed the images and demonstrated the findings to her today.  There was a tiny amount of fluid just on the anterior surface of the heart.  No further studies are indicated.  The  patient is reassured.  Medication Adjustments/Labs and Tests Ordered: Current medicines are reviewed at length with the patient today.  Concerns regarding medicines are outlined above.  Orders Placed This Encounter  Procedures   LFTs   Lipid panel   No orders of the defined types were placed in this encounter.   Patient Instructions  Medication Instructions:  Your provider recommends that you continue on your current medications as directed. Please refer to the Current Medication list given to you today.   *If you need a refill on your cardiac medications before your next appointment, please call your pharmacy*  Lab Work: TODAY: lipids, liver function If you have labs (blood work) drawn today and your tests are completely normal, you will receive your results only by:  [place] (if you have MyChart) OR  A paper copy in the mail If you have any lab test that is abnormal or we need to change your treatment, we will call you to review the results.  Follow-Up: At [facility], you and your health needs are our priority.  As part of our continuing mission to provide you with exceptional heart care, we have created designated Provider Care Teams.  These Care Teams include your primary Cardiologist (physician) and Advanced Practice Providers (APPs -  Physician Assistants and Nurse Practitioners) who all work together to provide you with the care you need, when you need it. Your next appointment:   12 month(s) The format for your next appointment:   In Person Provider:   You may see Sherren Mocha, MD or one of the following Advanced Practice Providers on your designated Care Team:    Richardson Dopp, PA-C  Vin Solomon, PA-C  Daune Perch, [place]    Signed, Sherren Mocha, MD  03/15/2019 1:30 PM    [place]

## 2019-03-15 NOTE — Patient Instructions (Signed)
Medication Instructions:  Your provider recommends that you continue on your current medications as directed. Please refer to the Current Medication list given to you today.   *If you need a refill on your cardiac medications before your next appointment, please call your pharmacy*  Lab Work: TODAY: lipids, liver function If you have labs (blood work) drawn today and your tests are completely normal, you will receive your results only by: Marland Kitchen MyChart Message (if you have MyChart) OR . A paper copy in the mail If you have any lab test that is abnormal or we need to change your treatment, we will call you to review the results.  Follow-Up: At [facility], you and your health needs are our priority.  As part of our continuing mission to provide you with exceptional heart care, we have created designated Provider Care Teams.  These Care Teams include your primary Cardiologist (physician) and Advanced Practice Providers (APPs -  Physician Assistants and Nurse Practitioners) who all work together to provide you with the care you need, when you need it. Your next appointment:   12 month(s) The format for your next appointment:   In Person Provider:   You may see Sherren Mocha, MD or one of the following Advanced Practice Providers on your designated Care Team:    Richardson Dopp, PA-C  Vin Electra, [place]  Daune Perch, [place]

## 2019-03-20 ENCOUNTER — Other Ambulatory Visit: Payer: PPO

## 2019-03-21 ENCOUNTER — Other Ambulatory Visit: Payer: PPO | Admitting: *Deleted

## 2019-03-21 ENCOUNTER — Other Ambulatory Visit: Payer: Self-pay

## 2019-03-21 ENCOUNTER — Other Ambulatory Visit: Payer: Self-pay | Admitting: Physician Assistant

## 2019-03-21 DIAGNOSIS — I2510 Atherosclerotic heart disease of native coronary artery without angina pectoris: Secondary | ICD-10-CM

## 2019-03-21 LAB — HEPATIC FUNCTION PANEL
ALT: 10 IU/L (ref 0–32)
AST: 26 IU/L (ref 0–40)
Albumin: 4.8 g/dL — ABNORMAL HIGH (ref 3.7–4.7)
Alkaline Phosphatase: 70 IU/L (ref 39–117)
Bilirubin Total: 0.4 mg/dL (ref 0.0–1.2)
Bilirubin, Direct: 0.1 mg/dL (ref 0.00–0.40)
Total Protein: 6.9 g/dL (ref 6.0–8.5)

## 2019-03-21 LAB — LIPID PANEL
Chol/HDL Ratio: 2.3 ratio (ref 0.0–4.4)
Cholesterol, Total: 150 mg/dL (ref 100–199)
HDL: 64 mg/dL (ref >39)
LDL Chol Calc (NIH): 67 mg/dL (ref 0–99)
Triglycerides: 103 mg/dL (ref 0–149)
VLDL Cholesterol Cal: 19 mg/dL (ref 5–40)

## 2019-04-02 ENCOUNTER — Telehealth: Payer: Self-pay | Admitting: Family Medicine

## 2019-04-02 NOTE — Telephone Encounter (Signed)
I left a message asking the patient to call and schedule Medicare AWV with Loma Sousa ([place]).  If patient calls back, please schedule Medicare Wellness Visit at next available opening. Last AWV 08/08/16 VDM ([place])

## 2019-04-17 ENCOUNTER — Encounter: Payer: Self-pay | Admitting: Gastroenterology

## 2019-04-17 ENCOUNTER — Other Ambulatory Visit: Payer: Self-pay

## 2019-04-17 ENCOUNTER — Ambulatory Visit (INDEPENDENT_AMBULATORY_CARE_PROVIDER_SITE_OTHER): Payer: PPO | Admitting: Gastroenterology

## 2019-04-17 VITALS — BP 126/68 | HR 104 | Temp 98.5°F | Ht 64.0 in | Wt 134.8 lb

## 2019-04-17 DIAGNOSIS — K2100 Gastro-esophageal reflux disease with esophagitis, without bleeding: Secondary | ICD-10-CM | POA: Diagnosis not present

## 2019-04-17 NOTE — Patient Instructions (Signed)
Continue pantoprazole and pepcid.   If your symptoms are under control then you can stop taking the pepcid.   Thank you for choosing me and [facility].  Pricilla Riffle. Dagoberto Ligas., MD., Marval Regal

## 2019-04-17 NOTE — Progress Notes (Signed)
    History of Present Illness: This is a 79 year old female returning for follow-up of GERD with erosive esophagitis.  She states her reflux symptoms have been well controlled on current regimen including elevation of the head of her bed.  She has very rare breakthrough symptoms.  Prior typical breakthrough symptoms were in the middle of the night.  She has questions about long-term management and if medications will be needed long-term.  She relates she is scheduled to receive the SARS-CoV-2 vaccine tomorrow and her husband is scheduled for next week.  EGD 01/2019 - LA Grade A reflux esophagitis. - Normal stomach. - Non-bleeding duodenal diverticulum. - No specimens collected.  Current Medications, Allergies, Past Medical History, Past Surgical History, Family History and Social History were reviewed in [employer] record.   Physical Exam: General: Well developed, well nourished, no acute distress Head: Normocephalic and atraumatic Eyes:  sclerae anicteric, EOMI  Neurological: Alert oriented x 4, grossly nonfocal Psychological:  Alert and cooperative. Normal mood and affect   Assessment and Recommendations:  1. GERD with LA Class A erosive esophagitis.  Follow antireflux measures.  Continue 4 inch head of bed elevation.  Continue pantoprazole 40 mg p.o. every morning and famotidine 40 mg at bedtime.  If symptoms remain under very good control for another month she may discontinue famotidine and assess response. If not adequately controlled consider changing pantoprazole dosing to every afternoon before dinner or resume famotidine at bedtime. REV in 1 year.

## 2019-04-18 ENCOUNTER — Ambulatory Visit: Payer: Medicare Other | Attending: Internal Medicine

## 2019-04-18 DIAGNOSIS — Z23 Encounter for immunization: Secondary | ICD-10-CM | POA: Insufficient documentation

## 2019-04-18 NOTE — Progress Notes (Signed)
   Covid-19 Vaccination Clinic  Name:  [name]    MRN: [id number] DOB: 1941-03-17  04/18/2019  [name] was observed post Covid-19 immunization for 15 minutes without incidence. She was provided with Vaccine Information Sheet and instruction to access the V-Safe system.   [name] was instructed to call 911 with any severe reactions post vaccine: Marland Kitchen Difficulty breathing  . Swelling of your face and throat  . A fast heartbeat  . A bad rash all over your body  . Dizziness and weakness    Immunizations Administered    Name Date Dose VIS Date Route   Pfizer COVID-19 Vaccine 04/18/2019 10:31 AM 0.3 mL 03/15/2019 Intramuscular   Manufacturer: [place]   Lot: [id number]   [place]: [id number]

## 2019-04-23 DIAGNOSIS — Z23 Encounter for immunization: Secondary | ICD-10-CM | POA: Diagnosis not present

## 2019-04-23 DIAGNOSIS — L578 Other skin changes due to chronic exposure to nonionizing radiation: Secondary | ICD-10-CM | POA: Diagnosis not present

## 2019-04-23 DIAGNOSIS — L821 Other seborrheic keratosis: Secondary | ICD-10-CM | POA: Diagnosis not present

## 2019-04-23 DIAGNOSIS — L570 Actinic keratosis: Secondary | ICD-10-CM | POA: Diagnosis not present

## 2019-04-23 DIAGNOSIS — Z85828 Personal history of other malignant neoplasm of skin: Secondary | ICD-10-CM | POA: Diagnosis not present

## 2019-05-08 ENCOUNTER — Ambulatory Visit: Payer: PPO | Attending: Internal Medicine

## 2019-05-08 DIAGNOSIS — Z23 Encounter for immunization: Secondary | ICD-10-CM

## 2019-05-08 NOTE — Progress Notes (Signed)
   Covid-19 Vaccination Clinic  Name:  [name]    MRN: [id number] DOB: 09-Sep-1940  05/08/2019  [name] was observed post Covid-19 immunization for 15 minutes without incidence. She was provided with Vaccine Information Sheet and instruction to access the V-Safe system.   [name] was instructed to call 911 with any severe reactions post vaccine: Marland Kitchen Difficulty breathing  . Swelling of your face and throat  . A fast heartbeat  . A bad rash all over your body  . Dizziness and weakness    Immunizations Administered    Name Date Dose VIS Date Route   Pfizer COVID-19 Vaccine 05/08/2019 10:29 AM 0.3 mL 03/15/2019 Intramuscular   Manufacturer: [place]   Lot: [id number]   [place]: [id number]

## 2019-06-13 DIAGNOSIS — N83209 Unspecified ovarian cyst, unspecified side: Secondary | ICD-10-CM | POA: Diagnosis not present

## 2019-07-08 ENCOUNTER — Telehealth: Payer: Self-pay | Admitting: Family Medicine

## 2019-07-08 NOTE — Telephone Encounter (Signed)
I left a message asking the patient to call and schedule Medicare AWV with Loma Sousa ([place]) and CPE w/ Dr. Jerline Pain.  If patient calls back, please schedule both at next available opening. Last AWV 08/08/2016 VDM ([place])

## 2019-07-10 DIAGNOSIS — H26493 Other secondary cataract, bilateral: Secondary | ICD-10-CM | POA: Diagnosis not present

## 2019-07-10 DIAGNOSIS — H40013 Open angle with borderline findings, low risk, bilateral: Secondary | ICD-10-CM | POA: Diagnosis not present

## 2019-07-10 DIAGNOSIS — H04123 Dry eye syndrome of bilateral lacrimal glands: Secondary | ICD-10-CM | POA: Diagnosis not present

## 2019-07-10 DIAGNOSIS — D3132 Benign neoplasm of left choroid: Secondary | ICD-10-CM | POA: Diagnosis not present

## 2019-12-29 ENCOUNTER — Other Ambulatory Visit: Payer: Self-pay | Admitting: Gastroenterology

## 2020-01-15 DIAGNOSIS — N83202 Unspecified ovarian cyst, left side: Secondary | ICD-10-CM | POA: Diagnosis not present

## 2020-01-15 DIAGNOSIS — N83201 Unspecified ovarian cyst, right side: Secondary | ICD-10-CM | POA: Diagnosis not present

## 2020-01-18 DIAGNOSIS — Z20822 Contact with and (suspected) exposure to covid-19: Secondary | ICD-10-CM | POA: Diagnosis not present

## 2020-01-30 ENCOUNTER — Other Ambulatory Visit: Payer: Self-pay | Admitting: Gastroenterology

## 2020-01-30 DIAGNOSIS — K219 Gastro-esophageal reflux disease without esophagitis: Secondary | ICD-10-CM

## 2020-01-30 DIAGNOSIS — Z1231 Encounter for screening mammogram for malignant neoplasm of breast: Secondary | ICD-10-CM | POA: Diagnosis not present

## 2020-01-30 DIAGNOSIS — N3281 Overactive bladder: Secondary | ICD-10-CM | POA: Diagnosis not present

## 2020-01-30 DIAGNOSIS — Z124 Encounter for screening for malignant neoplasm of cervix: Secondary | ICD-10-CM | POA: Diagnosis not present

## 2020-01-30 DIAGNOSIS — Z01419 Encounter for gynecological examination (general) (routine) without abnormal findings: Secondary | ICD-10-CM | POA: Diagnosis not present

## 2020-01-30 DIAGNOSIS — N3945 Continuous leakage: Secondary | ICD-10-CM | POA: Diagnosis not present

## 2020-01-30 DIAGNOSIS — Z6823 Body mass index (BMI) 23.0-23.9, adult: Secondary | ICD-10-CM | POA: Diagnosis not present

## 2020-02-05 ENCOUNTER — Encounter: Payer: Self-pay | Admitting: Gastroenterology

## 2020-03-06 NOTE — Telephone Encounter (Signed)
Due to provider schedule change 12/15, called patient to reschedule appointment. Left her a message that a MyChart message would be sent with alternative options for appointment and asked her to respond with her choice.

## 2020-03-09 ENCOUNTER — Encounter: Payer: Self-pay | Admitting: Cardiovascular Disease

## 2020-03-09 ENCOUNTER — Ambulatory Visit: Payer: PPO | Admitting: Cardiovascular Disease

## 2020-03-09 ENCOUNTER — Other Ambulatory Visit: Payer: Self-pay

## 2020-03-09 VITALS — BP 132/62 | HR 80 | Ht 64.0 in | Wt 135.0 lb

## 2020-03-09 DIAGNOSIS — I2510 Atherosclerotic heart disease of native coronary artery without angina pectoris: Secondary | ICD-10-CM | POA: Diagnosis not present

## 2020-03-09 DIAGNOSIS — E782 Mixed hyperlipidemia: Secondary | ICD-10-CM

## 2020-03-09 DIAGNOSIS — I10 Essential (primary) hypertension: Secondary | ICD-10-CM

## 2020-03-09 NOTE — Patient Instructions (Signed)
Medication Instructions:  Your provider recommends that you continue on your current medications as directed. Please refer to the Current Medication list given to you today.   *If you need a refill on your cardiac medications before your next appointment, please call your pharmacy*  Testing/Procedures: Your provider has requested that you have a lexiscan myoview. For further information please visit [email or web address]. Please follow instruction sheet, as given.  Follow-Up: At [facility], you and your health needs are our priority.  As part of our continuing mission to provide you with exceptional heart care, we have created designated Provider Care Teams.  These Care Teams include your primary Cardiologist (physician) and Advanced Practice Providers (APPs -  Physician Assistants and Nurse Practitioners) who all work together to provide you with the care you need, when you need it. Your next appointment:   12 month(s) The format for your next appointment:   In Person Provider:   You may see Michael Cooper, MD or one of the following Advanced Practice Providers on your designated Care Team:    Scott Weaver, PA-C  Vin Bhagat, PA-C   

## 2020-03-09 NOTE — Progress Notes (Signed)
Cardiology Office Note:    Date:  03/09/2020   ID:  [name], DOB January 02, 1941, MRN [id number]  PCP:  Vivi Barrack, MD  [facility] Cardiologist:  Sherren Mocha, MD  [place] Electrophysiologist:  None   Referring MD: Vivi Barrack, MD   Chief Complaint  Patient presents with  . Coronary Artery Disease   History of Present Illness:    [name] is a 79 y.o. female with a hx of coronary artery disease, presenting for follow-up evaluation.  The patient has undergone stenting of the LAD and right coronary arteries in 2012 using drug-eluting stents in each vessel.  She has done well since that time with no recurrent ischemic events.  She was last seen March 15, 2019 for routine cardiology follow-up.  At the time of her last visit, she had undergone a CT scan to evaluate diverticulitis and was incidentally noted to have aortic atherosclerosis and a trace pericardial effusion.  We discussed those findings at last year's visit.  The patient is here alone today. She had a recent episode when she was standing up cooking breakfast and began to feel nauseated. She then broke out in a profuse sweat. She went to lay down and felt better within 15 minutes. She has not had any other events like this. Otherwise doing very well. She's walking 9 holes of golf twice per week without exertional symptoms. She also does light weight lifting and crunches on a regular basis.   Past Medical History:  Diagnosis Date  . Allergy   . Anemia   . Arthritis   . CAD (coronary artery disease)    Dr Aundra Dubin  . Cancer (HCC)    BCC nose  . Cataract    both eyes- surgically  removed  . Diverticulosis of colon   . GERD (gastroesophageal reflux disease)   . Herpes zoster    1997 L flank  . Hyperlipidemia    intol. to statins in past  . Osteopenia    Dr Radene Knee, Gynecology  . Osteopenia     Past Surgical History:  Procedure Laterality Date  . CATARACT EXTRACTION  2010   OD  , Dr. Herbert Deaner   . CATARACT EXTRACTION W/ INTRAOCULAR LENS IMPLANT Left 02/2014   Vision not significantly improved  . COLONOSCOPY  2007   [facility]  . CORONARY ANGIOPLASTY WITH STENT PLACEMENT  06/19/2010   2 vessel; Dr Burt Knack  . LUMBAR DISC SURGERY  11/03/2014  . VARICOSE VEIN SURGERY  1971    Current Medications: Current Meds  Medication Sig  . aspirin 81 MG tablet Take 81 mg by mouth daily.    . Black Currant Seed Oil 500 MG CAPS Take 1 capsule by mouth daily.   . Calcium Citrate-Vitamin D (CITRUS CALCIUM 1500 + D PO) Take 1 capsule by mouth daily.   . Coenzyme Q10 (COQ10) 200 MG CAPS Take 1 capsule by mouth daily.   Marland Kitchen CRANBERRY-CALCIUM PO Take 1 capsule by mouth daily.  . famotidine (PEPCID) 40 MG tablet TAKE 1 TABLET BY MOUTH EVERYDAY AT BEDTIME  . Gelatin 600 MG CAPS Take 1 capsule by mouth daily.   . Influenza Vac High-Dose Quad (FLUZONE HIGH-DOSE QUADRIVALENT) 0.7 ML SUSY Fluzone High-Dose Quad 2020-21 (PF) 240 mcg/0.7 mL IM syringe  PHARMACY ADMINISTERED  . Multiple Vitamin (MULTIVITAMIN) capsule Take 1 capsule by mouth daily.    . nitroGLYCERIN (NITROSTAT) 0.4 MG SL tablet Place 0.4 mg under the tongue every 5 (five) minutes as  needed for chest pain.   Marland Kitchen ondansetron (ZOFRAN) 4 MG tablet Take 4 mg by mouth every 8 (eight) hours as needed for nausea or vomiting.  . pantoprazole (PROTONIX) 40 MG tablet TAKE 1 TABLET BY MOUTH EVERY DAY  . rosuvastatin (CRESTOR) 10 MG tablet TAKE 1 TABLET BY MOUTH EVERY DAY   Current Facility-Administered Medications for the 03/09/20 encounter (Office Visit) with Sherren Mocha, MD  Medication  . 0.9 %  sodium chloride infusion     Allergies:   Lisinopril, Metoprolol, Pravastatin sodium, and Oxycodone   Social History   Socioeconomic History  . Marital status: Married    Spouse name: Not on file  . Number of children: 4  . Years of education: Not on file  . Highest education level: Not on file  Occupational History  . Not on file  Tobacco Use  .  Smoking status: Never Smoker  . Smokeless tobacco: Never Used  Vaping Use  . Vaping Use: Never used  Substance and Sexual Activity  . Alcohol use: Yes    Comment: extremely rarely  . Drug use: No  . Sexual activity: Not on file  Other Topics Concern  . Not on file  Social History Narrative   Exercise: hand weight, crunches, some walking   Social Determinants of Health   Financial Resource Strain:   . Difficulty of Paying Living Expenses: Not on file  Food Insecurity:   . Worried About Charity fundraiser in the Last Year: Not on file  . Ran Out of Food in the Last Year: Not on file  Transportation Needs:   . Lack of Transportation (Medical): Not on file  . Lack of Transportation (Non-Medical): Not on file  Physical Activity:   . Days of Exercise per Week: Not on file  . Minutes of Exercise per Session: Not on file  Stress:   . Feeling of Stress : Not on file  Social Connections:   . Frequency of Communication with Friends and Family: Not on file  . Frequency of Social Gatherings with Friends and Family: Not on file  . Attends Religious Services: Not on file  . Active Member of Clubs or Organizations: Not on file  . Attends Archivist Meetings: Not on file  . Marital Status: Not on file     Family History: The patient's family history includes Asthma in her son; CVA in her paternal grandmother; CVA (age of onset: 72) in her mother; Cancer in her maternal grandmother; Deep vein thrombosis in her paternal grandmother; Diabetes in her paternal grandmother; Esophageal cancer (age of onset: 23) in her father; Heart attack (age of onset: 30) in her maternal uncle; Heart attack (age of onset: 21) in her father; Heart failure in her mother; Osteoarthritis in her mother; Pancreatic cancer in her paternal uncle; Skin cancer in her mother. There is no history of Colon cancer, Colon polyps, Rectal cancer, or Stomach cancer.  ROS:   Please see the history of present illness.     Back pain, otherwise all other systems reviewed and are negative.  EKGs/Labs/Other Studies Reviewed:    The following studies were reviewed today: CPX 07/01/12: Conclusion: Exercise testing with gas exchange demonstrates a  normal functional capacity when compared to matched sedentary  norms. The flattened O2 pulse suggests a very mild circulatory  limitation at peak in the setting of a hypertensive response to  exercise which likely represents mild diastolic dysfunction.  Resting spirometry showed mild obstruction but this was not  a  limiting factor at peak.   GXT 2018: Study Result    Blood pressure demonstrated a hypertensive response to exercise.   There was no ST segment deviation noted during stress.    Poor exercise capacity. 85% of maximum heart rate was achieved after 1.4  minutes.   Hypertensive response to exercise with max blood pressure 206/78 mmHg.  No ischemia.    EKG:  EKG is ordered today.  The ekg ordered today demonstrates NSR 83 bpm, within normal limts  Recent Labs: 03/21/2019: ALT 10  Recent Lipid Panel    Component Value Date/Time   CHOL 150 03/21/2019 0818   TRIG 103 03/21/2019 0818   TRIG 75 02/08/2006 1223   HDL 64 03/21/2019 0818   CHOLHDL 2.3 03/21/2019 0818   CHOLHDL 3 08/10/2017 0928   VLDL 20.2 08/10/2017 0928   LDLCALC 67 03/21/2019 0818   LDLDIRECT 158.8 05/24/2010 1013     Risk Assessment/Calculations:       Physical Exam:    VS:  BP 132/62   Pulse 80   Ht 5\' 4"  (1.626 m)   Wt 135 lb (61.2 kg)   SpO2 98%   BMI 23.17 kg/m     Wt Readings from Last 3 Encounters:  03/09/20 135 lb (61.2 kg)  04/17/19 134 lb 12.8 oz (61.1 kg)  03/15/19 134 lb 6.4 oz (61 kg)     GEN:  Well nourished, well developed in no acute distress HEENT: Normal NECK: No JVD; No carotid bruits LYMPHATICS: No lymphadenopathy CARDIAC: RRR, no murmurs, rubs, gallops RESPIRATORY:  Clear to auscultation without rales, wheezing or rhonchi  ABDOMEN:  Soft, non-tender, non-distended MUSCULOSKELETAL:  No edema; No deformity  SKIN: Warm and dry NEUROLOGIC:  Alert and oriented x 3 PSYCHIATRIC:  Normal affect   ASSESSMENT:    1. Coronary artery disease involving native coronary artery of native heart without angina pectoris   2. Essential hypertension   3. Mixed hyperlipidemia    PLAN:    In order of problems listed above:  1. Pt clinically stable with an atypical episode recently when she developed nausea and diaphoresis without near-syncope or chest pain. She initially presented with atypical symptoms and no angina when she was found to have severe LAD stenosis and a CTO of the RCA, both treated with PCI in 2012. Her last stress test showed poor exercise tolerance and hypertensive response without ischemic changes on EKG. I have recommended a Lexiscan Myoview for further evaluation. Otherwise continue ASA and rosuvastatin at current doses.  2. BP controlled 3. Last lipids one year ago reviewed - LDL 59m, HDL 64. Continue rosuvastatin. Labs followed by PCP.    Shared Decision Making/Informed Consent    The risks [chest pain, shortness of breath, cardiac arrhythmias, dizziness, blood pressure fluctuations, myocardial infarction, stroke/transient ischemic attack, nausea, vomiting, allergic reaction, radiation exposure, metallic taste sensation and life-threatening complications (estimated to be 1 in 10,000)], benefits (risk stratification, diagnosing coronary artery disease, treatment guidance) and alternatives of a nuclear stress test were discussed in detail with Ms. Kilroy and she agrees to proceed.       Medication Adjustments/Labs and Tests Ordered: Current medicines are reviewed at length with the patient today.  Concerns regarding medicines are outlined above.  Orders Placed This Encounter  Procedures  . EKG 12-Lead   No orders of the defined types were placed in this encounter.   There are no Patient Instructions on file  for this visit.   Signed, Sherren Mocha, MD  03/09/2020 12:33 PM    [facility]

## 2020-03-17 ENCOUNTER — Other Ambulatory Visit: Payer: Self-pay | Admitting: Cardiovascular Disease

## 2020-03-17 ENCOUNTER — Other Ambulatory Visit: Payer: Self-pay | Admitting: Gastroenterology

## 2020-03-18 ENCOUNTER — Ambulatory Visit: Payer: PPO | Admitting: Cardiovascular Disease

## 2020-04-06 ENCOUNTER — Telehealth (HOSPITAL_COMMUNITY): Payer: Self-pay | Admitting: *Deleted

## 2020-04-06 NOTE — Telephone Encounter (Signed)
Patient given detailed instructions per Myocardial Perfusion Study Information Sheet for the test on 04/09/19. Patient notified to arrive 15 minutes early and that it is imperative to arrive on time for appointment to keep from having the test rescheduled.  If you need to cancel or reschedule your appointment, please call the office within 24 hours of your appointment. . Patient verbalized understanding. Latisia Hilaire Jacqueline    

## 2020-04-08 ENCOUNTER — Other Ambulatory Visit: Payer: Self-pay

## 2020-04-08 ENCOUNTER — Ambulatory Visit (HOSPITAL_COMMUNITY): Payer: PPO | Attending: Cardiovascular Disease

## 2020-04-08 DIAGNOSIS — I2510 Atherosclerotic heart disease of native coronary artery without angina pectoris: Secondary | ICD-10-CM | POA: Insufficient documentation

## 2020-04-08 LAB — MYOCARDIAL PERFUSION IMAGING
LV dias vol: 59 mL (ref 46–106)
LV sys vol: 21 mL
Peak HR: 101 {beats}/min
Rest HR: 62 {beats}/min
SDS: 2
SRS: 0
SSS: 2
TID: 0.87

## 2020-04-08 MED ORDER — TECHNETIUM TC 99M TETROFOSMIN IV KIT
10.8000 | PACK | Freq: Once | INTRAVENOUS | Status: AC | PRN
  Administered 2020-04-08: 10.8 via INTRAVENOUS
  Filled 2020-04-08: qty 11

## 2020-04-08 MED ORDER — TECHNETIUM TC 99M TETROFOSMIN IV KIT
31.5000 | PACK | Freq: Once | INTRAVENOUS | Status: AC | PRN
  Administered 2020-04-08: 31.5 via INTRAVENOUS
  Filled 2020-04-08: qty 32

## 2020-04-08 MED ORDER — REGADENOSON 0.4 MG/5ML IV SOLN
0.4000 mg | Freq: Once | INTRAVENOUS | Status: AC
  Administered 2020-04-08: 0.4 mg via INTRAVENOUS

## 2020-04-15 DIAGNOSIS — N83209 Unspecified ovarian cyst, unspecified side: Secondary | ICD-10-CM | POA: Diagnosis not present

## 2020-05-03 ENCOUNTER — Other Ambulatory Visit: Payer: Self-pay | Admitting: Gastroenterology

## 2020-05-03 DIAGNOSIS — K219 Gastro-esophageal reflux disease without esophagitis: Secondary | ICD-10-CM

## 2020-06-03 DIAGNOSIS — M1812 Unilateral primary osteoarthritis of first carpometacarpal joint, left hand: Secondary | ICD-10-CM | POA: Diagnosis not present

## 2020-06-03 DIAGNOSIS — M65312 Trigger thumb, left thumb: Secondary | ICD-10-CM | POA: Diagnosis not present

## 2020-06-22 ENCOUNTER — Other Ambulatory Visit: Payer: Self-pay | Admitting: Gastroenterology

## 2020-07-09 DIAGNOSIS — H26493 Other secondary cataract, bilateral: Secondary | ICD-10-CM | POA: Diagnosis not present

## 2020-07-09 DIAGNOSIS — H40013 Open angle with borderline findings, low risk, bilateral: Secondary | ICD-10-CM | POA: Diagnosis not present

## 2020-07-09 DIAGNOSIS — H35363 Drusen (degenerative) of macula, bilateral: Secondary | ICD-10-CM | POA: Diagnosis not present

## 2020-07-09 DIAGNOSIS — H26491 Other secondary cataract, right eye: Secondary | ICD-10-CM | POA: Diagnosis not present

## 2020-07-09 DIAGNOSIS — D3132 Benign neoplasm of left choroid: Secondary | ICD-10-CM | POA: Diagnosis not present

## 2020-07-29 ENCOUNTER — Telehealth: Payer: Self-pay | Admitting: Gastroenterology

## 2020-07-29 DIAGNOSIS — H26492 Other secondary cataract, left eye: Secondary | ICD-10-CM | POA: Diagnosis not present

## 2020-07-29 MED ORDER — FAMOTIDINE 40 MG PO TABS: ORAL_TABLET | ORAL | 0 refills | Status: DC

## 2020-07-29 NOTE — Telephone Encounter (Signed)
Prescription sent to patient's pharmacy until scheduled appt on 08/28/20

## 2020-08-10 IMAGING — CR LUMBAR SPINE - COMPLETE 4+ VIEW
5 series · 5 of 5 positions shown · non-contrast
Comparison: 05/22/2014

CLINICAL DATA: Midline to left sided LBP/posterior left hip pain x
October 03, 2018 with no known injury, previous lumbar surgery in December 2014

EXAM:
LUMBAR SPINE - COMPLETE 4+ VIEW

[l-spine ap]
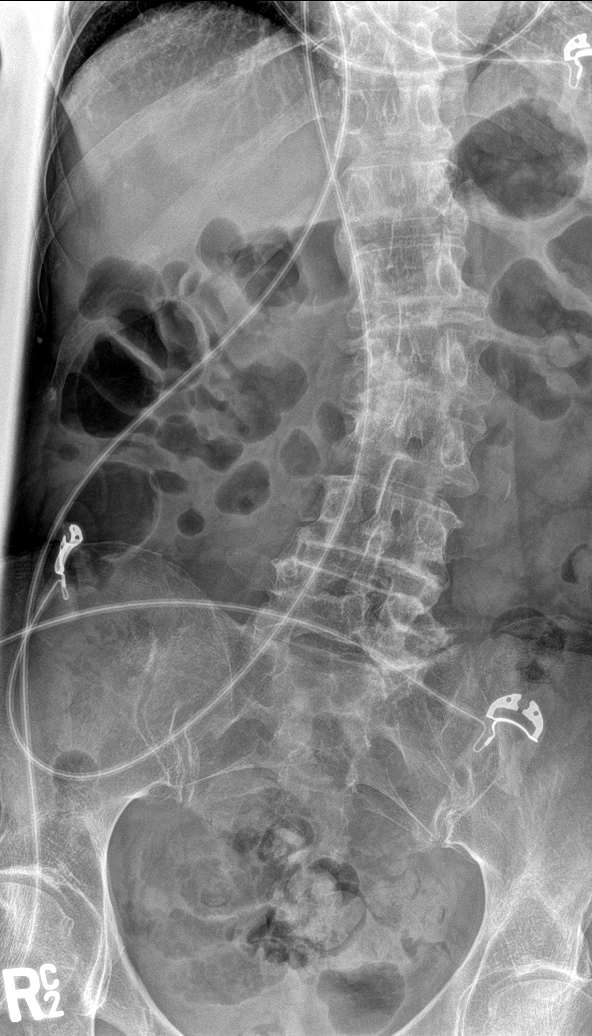

[l-spine obl (1 of 2)]
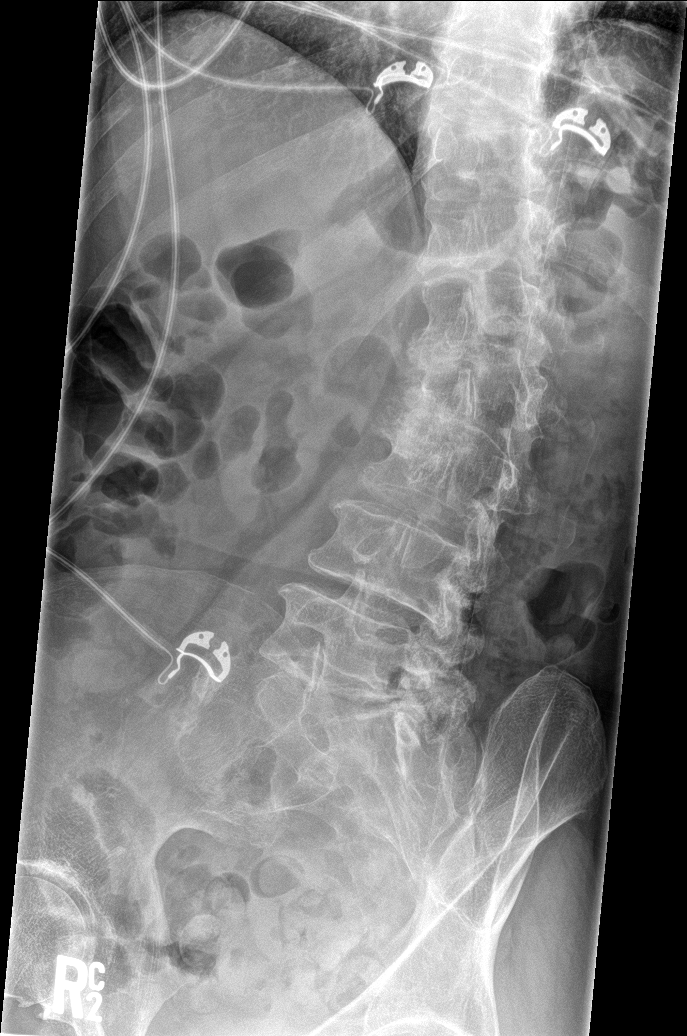

[l-spine obl (2 of 2)]
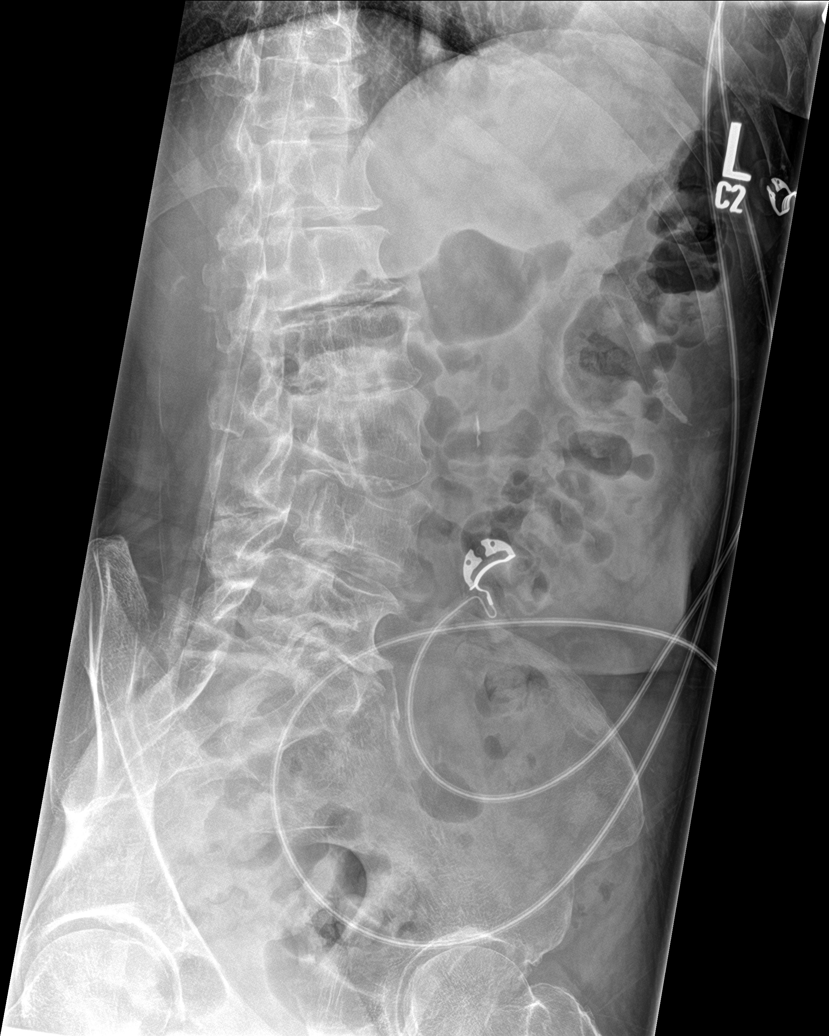

[l-spine spot]
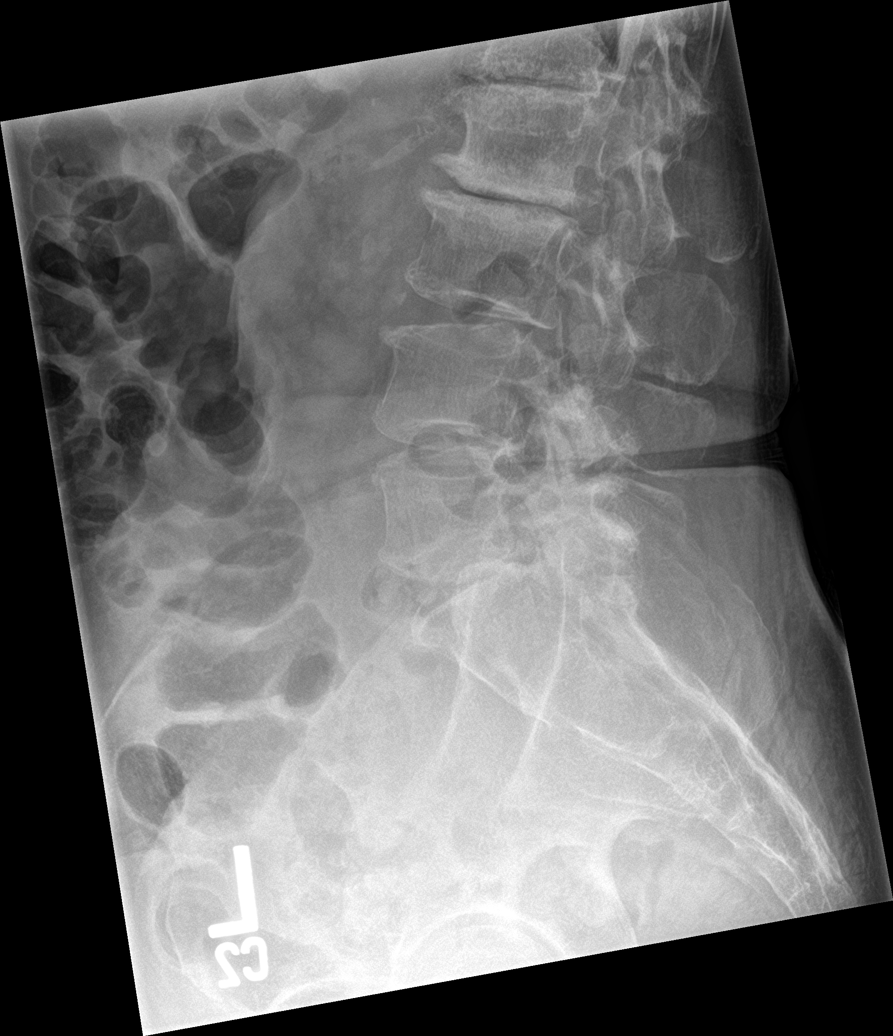

[l-spine lat]
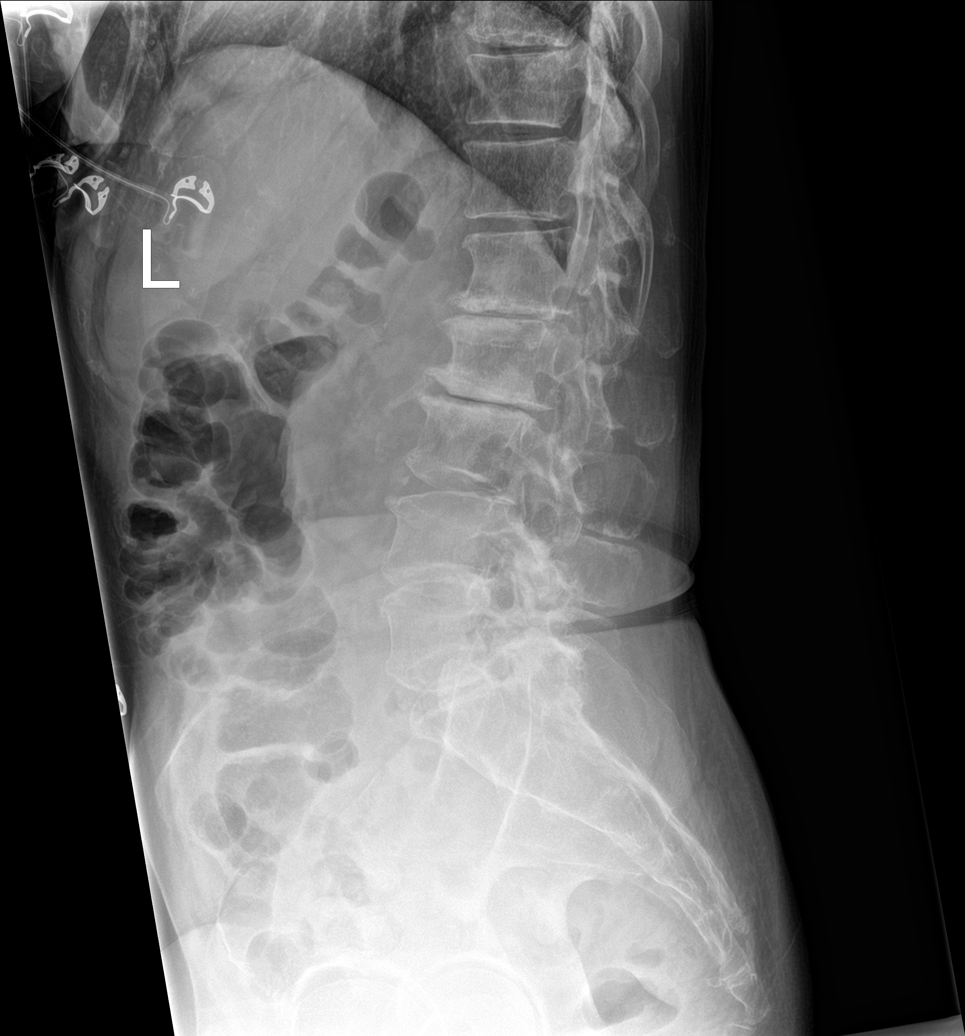

[5 of 5 positions shown; findings below may reference images not displayed]

FINDINGS: No fracture.  No spondylolisthesis.  No bone lesion.

Moderate levoscoliosis, apex at L2.

Moderate loss of disc height with endplate osteophytes and sclerosis
at L1-L2 and L2-L3. Mild loss of disc height at L4-L5. Small
endplate osteophytes throughout the lumbar spine.

Skeletal structures are demineralized.

Soft tissues are unremarkable.
IMPRESSION: 1. No fracture or acute finding.
2. Degenerative changes and moderate levoscoliosis as described,
similar to the prior radiographs.

## 2020-08-13 DIAGNOSIS — M48061 Spinal stenosis, lumbar region without neurogenic claudication: Secondary | ICD-10-CM | POA: Diagnosis not present

## 2020-08-26 DIAGNOSIS — M545 Low back pain, unspecified: Secondary | ICD-10-CM | POA: Diagnosis not present

## 2020-08-26 DIAGNOSIS — M48061 Spinal stenosis, lumbar region without neurogenic claudication: Secondary | ICD-10-CM | POA: Diagnosis not present

## 2020-08-28 ENCOUNTER — Encounter: Payer: Self-pay | Admitting: Gastroenterology

## 2020-08-28 ENCOUNTER — Ambulatory Visit: Payer: PPO | Admitting: Gastroenterology

## 2020-08-28 VITALS — BP 128/72 | HR 76 | Ht 64.0 in | Wt 137.4 lb

## 2020-08-28 DIAGNOSIS — K2100 Gastro-esophageal reflux disease with esophagitis, without bleeding: Secondary | ICD-10-CM | POA: Diagnosis not present

## 2020-08-28 DIAGNOSIS — K219 Gastro-esophageal reflux disease without esophagitis: Secondary | ICD-10-CM | POA: Diagnosis not present

## 2020-08-28 MED ORDER — FAMOTIDINE 40 MG PO TABS: ORAL_TABLET | ORAL | 3 refills | Status: DC

## 2020-08-28 MED ORDER — PANTOPRAZOLE SODIUM 40 MG PO TBEC: 1.0000 | DELAYED_RELEASE_TABLET | Freq: Every day | ORAL | 3 refills | Status: DC

## 2020-08-28 MED ORDER — ONDANSETRON HCL 4 MG PO TABS: 4.0000 mg | ORAL_TABLET | Freq: Three times a day (TID) | ORAL | 2 refills | Status: DC | PRN

## 2020-08-28 NOTE — Patient Instructions (Signed)
We have sent the following medications to your pharmacy for you to pick up at your convenience: pantoprazole and zofran.   Thank you for choosing me and [facility].  Malcolm T. Stark, Jr., MD., FACG   

## 2020-08-28 NOTE — Progress Notes (Signed)
    History of Present Illness: This is a 80 year old female with GERD and erosive esophagitis.  She relates that her reflux symptoms are under very good control on her current regimen.  She rarely has breakthrough symptoms.  She relates an episode of dyspeptic symptoms and constipation after taking an antibiotic for dental infection.  She took MiraLAX for few days and her symptoms resolved completely.  She notes intermittent nausea especially if she needs to take medication for pain and requests a refill of Zofran to use intermittently.  EGD 01/2019 - LA Grade A reflux esophagitis. - Normal stomach. - Non-bleeding duodenal diverticulum. - No specimens collected.  Current Medications, Allergies, Past Medical History, Past Surgical History, Family History and Social History were reviewed in [employer] record.   Physical Exam: General: Well developed, well nourished, no acute distress Head: Normocephalic and atraumatic Eyes: Sclerae anicteric, EOMI Ears: Normal auditory acuity Mouth: Not examined, mask on during Covid-19 pandemic Lungs: Clear throughout to auscultation Heart: Regular rate and rhythm; no murmurs, rubs or bruits Abdomen: Soft, non tender and non distended. No masses, hepatosplenomegaly or hernias noted. Normal Bowel sounds Rectal: Not done Musculoskeletal: Symmetrical with no gross deformities  Pulses:  Normal pulses noted Extremities: No clubbing, cyanosis, edema or deformities noted Neurological: Alert oriented x 4, grossly nonfocal Psychological:  Alert and cooperative. Normal mood and affect   Assessment and Recommendations:  1. GERD with LA class A esophagitis.  Follow antireflux measures.  Continue pantoprazole 40 mg every morning and famotidine 40 mg nightly.  Zofran 4 mg 3 times daily as needed nausea vomiting. REV in 1 year.

## 2020-08-28 NOTE — Addendum Note (Signed)
Addended by: Dorisann Frames L on: 08/28/2020 10:17 AM   Modules accepted: Orders

## 2020-09-07 DIAGNOSIS — M65312 Trigger thumb, left thumb: Secondary | ICD-10-CM | POA: Diagnosis not present

## 2020-09-23 DIAGNOSIS — M48061 Spinal stenosis, lumbar region without neurogenic claudication: Secondary | ICD-10-CM | POA: Diagnosis not present

## 2020-10-01 DIAGNOSIS — C44310 Basal cell carcinoma of skin of unspecified parts of face: Secondary | ICD-10-CM | POA: Diagnosis not present

## 2020-10-01 DIAGNOSIS — N390 Urinary tract infection, site not specified: Secondary | ICD-10-CM | POA: Diagnosis not present

## 2020-10-01 DIAGNOSIS — I2510 Atherosclerotic heart disease of native coronary artery without angina pectoris: Secondary | ICD-10-CM | POA: Diagnosis not present

## 2020-10-01 DIAGNOSIS — M419 Scoliosis, unspecified: Secondary | ICD-10-CM | POA: Diagnosis not present

## 2020-10-01 DIAGNOSIS — M1990 Unspecified osteoarthritis, unspecified site: Secondary | ICD-10-CM | POA: Diagnosis not present

## 2020-10-06 DIAGNOSIS — E611 Iron deficiency: Secondary | ICD-10-CM | POA: Diagnosis not present

## 2020-10-06 DIAGNOSIS — N390 Urinary tract infection, site not specified: Secondary | ICD-10-CM | POA: Diagnosis not present

## 2020-10-06 DIAGNOSIS — R030 Elevated blood-pressure reading, without diagnosis of hypertension: Secondary | ICD-10-CM | POA: Diagnosis not present

## 2020-10-06 DIAGNOSIS — R1031 Right lower quadrant pain: Secondary | ICD-10-CM | POA: Diagnosis not present

## 2020-10-06 DIAGNOSIS — K5790 Diverticulosis of intestine, part unspecified, without perforation or abscess without bleeding: Secondary | ICD-10-CM | POA: Diagnosis not present

## 2020-10-06 DIAGNOSIS — K219 Gastro-esophageal reflux disease without esophagitis: Secondary | ICD-10-CM | POA: Diagnosis not present

## 2020-10-06 DIAGNOSIS — E559 Vitamin D deficiency, unspecified: Secondary | ICD-10-CM | POA: Diagnosis not present

## 2020-10-06 DIAGNOSIS — Z0001 Encounter for general adult medical examination with abnormal findings: Secondary | ICD-10-CM | POA: Diagnosis not present

## 2020-10-06 DIAGNOSIS — E785 Hyperlipidemia, unspecified: Secondary | ICD-10-CM | POA: Diagnosis not present

## 2020-10-06 DIAGNOSIS — R7309 Other abnormal glucose: Secondary | ICD-10-CM | POA: Diagnosis not present

## 2020-10-06 DIAGNOSIS — I2510 Atherosclerotic heart disease of native coronary artery without angina pectoris: Secondary | ICD-10-CM | POA: Diagnosis not present

## 2020-10-08 DIAGNOSIS — N390 Urinary tract infection, site not specified: Secondary | ICD-10-CM | POA: Diagnosis not present

## 2020-11-12 DIAGNOSIS — M48061 Spinal stenosis, lumbar region without neurogenic claudication: Secondary | ICD-10-CM | POA: Diagnosis not present

## 2020-11-17 DIAGNOSIS — E785 Hyperlipidemia, unspecified: Secondary | ICD-10-CM | POA: Diagnosis not present

## 2020-11-17 DIAGNOSIS — Z0001 Encounter for general adult medical examination with abnormal findings: Secondary | ICD-10-CM | POA: Diagnosis not present

## 2020-11-17 DIAGNOSIS — I2510 Atherosclerotic heart disease of native coronary artery without angina pectoris: Secondary | ICD-10-CM | POA: Diagnosis not present

## 2020-11-17 DIAGNOSIS — D509 Iron deficiency anemia, unspecified: Secondary | ICD-10-CM | POA: Diagnosis not present

## 2020-12-18 DIAGNOSIS — Z23 Encounter for immunization: Secondary | ICD-10-CM | POA: Diagnosis not present

## 2020-12-29 ENCOUNTER — Ambulatory Visit: Payer: PPO | Admitting: Cardiovascular Disease

## 2021-02-04 DIAGNOSIS — D3132 Benign neoplasm of left choroid: Secondary | ICD-10-CM | POA: Diagnosis not present

## 2021-02-04 DIAGNOSIS — H35363 Drusen (degenerative) of macula, bilateral: Secondary | ICD-10-CM | POA: Diagnosis not present

## 2021-02-04 DIAGNOSIS — H40013 Open angle with borderline findings, low risk, bilateral: Secondary | ICD-10-CM | POA: Diagnosis not present

## 2021-02-04 DIAGNOSIS — H538 Other visual disturbances: Secondary | ICD-10-CM | POA: Diagnosis not present

## 2021-02-04 DIAGNOSIS — H524 Presbyopia: Secondary | ICD-10-CM | POA: Diagnosis not present

## 2021-04-11 ENCOUNTER — Other Ambulatory Visit: Payer: Self-pay | Admitting: Cardiovascular Disease

## 2021-04-15 ENCOUNTER — Other Ambulatory Visit: Payer: Self-pay

## 2021-04-15 ENCOUNTER — Encounter: Payer: Self-pay | Admitting: Cardiovascular Disease

## 2021-04-15 ENCOUNTER — Ambulatory Visit: Payer: PPO | Admitting: Cardiovascular Disease

## 2021-04-15 VITALS — BP 130/80 | HR 75 | Ht 64.0 in | Wt 134.4 lb

## 2021-04-15 DIAGNOSIS — E782 Mixed hyperlipidemia: Secondary | ICD-10-CM | POA: Diagnosis not present

## 2021-04-15 DIAGNOSIS — I10 Essential (primary) hypertension: Secondary | ICD-10-CM | POA: Diagnosis not present

## 2021-04-15 DIAGNOSIS — I2510 Atherosclerotic heart disease of native coronary artery without angina pectoris: Secondary | ICD-10-CM | POA: Diagnosis not present

## 2021-04-15 MED ORDER — AMLODIPINE BESYLATE 5 MG PO TABS: 5.0000 mg | ORAL_TABLET | Freq: Every day | ORAL | 3 refills | Status: DC

## 2021-04-15 NOTE — Progress Notes (Signed)
Cardiology Office Note:    Date:  04/15/2021   ID:  [name], DOB April 15, 1940, MRN [id number]  PCP:  Hayden Rasmussen, MD   [facility] Providers Cardiologist:  Sherren Mocha, MD     Referring MD: Vivi Barrack, MD   Chief Complaint  Patient presents with   Coronary Artery Disease    History of Present Illness:    [name] is a 81 y.o. female with a hx of coronary artery disease, presenting for follow-up evaluation.  The patient has undergone stenting of the LAD and right coronary arteries in 2012 using drug-eluting stents in each vessel.  She has done well since that time with no recurrent ischemic events.    The patient is here alone today. She remains active with no exertional symptoms. Today, she denies symptoms of palpitations, chest pain, shortness of breath, orthopnea, PND, lower extremity edema, dizziness, or syncope. She has some limitation from back, hip, and leg pain at times. She stopped taking Coenzyme Q10 because of headaches. She uses Aleve intermittently for pain symptoms.   Past Medical History:  Diagnosis Date   Allergy    Anemia    Arthritis    CAD (coronary artery disease)    Dr Aundra Dubin   Cancer [facility])    [place] nose   Cataract    both eyes- surgically  removed   Diverticulosis of colon    GERD (gastroesophageal reflux disease)    Herpes zoster    1997 L flank   Hyperlipidemia    intol. to statins in past   Osteopenia    Dr Radene Knee, Gynecology   Osteopenia     Past Surgical History:  Procedure Laterality Date   CATARACT EXTRACTION  2010   OD  , Dr. Herbert Deaner   CATARACT EXTRACTION W/ INTRAOCULAR LENS IMPLANT Left 02/2014   Vision not significantly improved   COLONOSCOPY  2007   [facility]   CORONARY ANGIOPLASTY WITH STENT PLACEMENT  06/19/2010   2 vessel; Dr Burt Knack   LUMBAR DISC SURGERY  11/03/2014   VARICOSE VEIN SURGERY  1971    Current Medications: Current Meds  Medication Sig   Acetaminophen-Codeine 300-30 MG tablet  acetaminophen 300 mg-codeine 30 mg tablet  TAKE 1 TABLET BY MOUTH EVERY 4 TO 6 HRS AS NEEDED FOR PAIN   amLODipine (NORVASC) 5 MG tablet Take 1 tablet (5 mg total) by mouth daily.   aspirin 81 MG tablet Take 81 mg by mouth daily.   Black Currant Seed Oil 500 MG CAPS Take 1 capsule by mouth daily.   Calcium Citrate-Vitamin D (CITRUS CALCIUM 1500 + D PO) Take 1 capsule by mouth daily.   famotidine (PEPCID) 40 MG tablet TAKE 1 TABLET BY MOUTH EVERYDAY AT BEDTIME   Gelatin 600 MG CAPS Take 1 capsule by mouth daily.    Multiple Vitamin (MULTIVITAMIN) capsule Take 1 capsule by mouth daily.   nitroGLYCERIN (NITROSTAT) 0.4 MG SL tablet Place 0.4 mg under the tongue every 5 (five) minutes as needed for chest pain.   ondansetron (ZOFRAN) 4 MG tablet Take 1 tablet (4 mg total) by mouth every 8 (eight) hours as needed for nausea or vomiting.   pantoprazole (PROTONIX) 40 MG tablet Take 1 tablet (40 mg total) by mouth daily.   rosuvastatin (CRESTOR) 10 MG tablet Take 1 tablet (10 mg total) by mouth daily. Pt needs to keep upcoming appt in Jan, 2023 for further refills   Current Facility-Administered Medications for the 04/15/21 encounter (Office  Visit) with Sherren Mocha, MD  Medication   0.9 %  sodium chloride infusion     Allergies:   Lisinopril, Metoprolol, Pravastatin sodium, and Oxycodone   Social History   Socioeconomic History   Marital status: Married    Spouse name: Not on file   Number of children: 4   Years of education: Not on file   Highest education level: Not on file  Occupational History   Not on file  Tobacco Use   Smoking status: Never   Smokeless tobacco: Never  Vaping Use   Vaping Use: Never used  Substance and Sexual Activity   Alcohol use: Yes    Comment: extremely rarely   Drug use: No   Sexual activity: Not on file  Other Topics Concern   Not on file  Social History Narrative   Exercise: hand weight, crunches, some walking   Social Determinants of Health    Financial Resource Strain: Not on file  Food Insecurity: Not on file  Transportation Needs: Not on file  Physical Activity: Not on file  Stress: Not on file  Social Connections: Not on file     Family History: The patient's family history includes Asthma in her son; CVA in her paternal grandmother; CVA (age of onset: 72) in her mother; Cancer in her maternal grandmother; Deep vein thrombosis in her paternal grandmother; Diabetes in her paternal grandmother; Esophageal cancer (age of onset: 36) in her father; Heart attack (age of onset: 59) in her maternal uncle; Heart attack (age of onset: 43) in her father; Heart failure in her mother; Osteoarthritis in her mother; Pancreatic cancer in her paternal uncle; Skin cancer in her mother. There is no history of Colon cancer, Colon polyps, Rectal cancer, or Stomach cancer.  ROS:   Please see the history of present illness.    All other systems reviewed and are negative.  EKGs/Labs/Other Studies Reviewed:    The following studies were reviewed today: Myoview Scan 04/08/20: The left ventricular ejection fraction is normal (55-65%). Nuclear stress EF: 65%. Blood pressure demonstrated a normal response to exercise. There was no ST segment deviation noted during stress. The study is normal. This is a low risk study.   Normal resting and stress perfusion. No ischemia or infarction EF 64%  EKG:  EKG is ordered today.  The ekg ordered today demonstrates NSR, 75 bpm, within normal limits  Recent Labs: No results found for requested labs within last 8760 hours.  Recent Lipid Panel    Component Value Date/Time   CHOL 150 03/21/2019 0818   TRIG 103 03/21/2019 0818   TRIG 75 02/08/2006 1223   HDL 64 03/21/2019 0818   CHOLHDL 2.3 03/21/2019 0818   CHOLHDL 3 08/10/2017 0928   VLDL 20.2 08/10/2017 0928   LDLCALC 67 03/21/2019 0818   LDLDIRECT 158.8 05/24/2010 1013     Risk Assessment/Calculations:           Physical Exam:    VS:  BP  130/80    Pulse 75    Ht 5\' 4"  (1.626 m)    Wt 134 lb 6.4 oz (61 kg)    SpO2 98%    BMI 23.07 kg/m     Wt Readings from Last 3 Encounters:  04/15/21 134 lb 6.4 oz (61 kg)  08/28/20 137 lb 6.4 oz (62.3 kg)  03/09/20 135 lb (61.2 kg)     GEN:  Well nourished, well developed in no acute distress HEENT: Normal NECK: No JVD; No carotid bruits  LYMPHATICS: No lymphadenopathy CARDIAC: RRR, no murmurs, rubs, gallops RESPIRATORY:  Clear to auscultation without rales, wheezing or rhonchi  ABDOMEN: Soft, non-tender, non-distended MUSCULOSKELETAL:  No edema; No deformity  SKIN: Warm and dry NEUROLOGIC:  Alert and oriented x 3 PSYCHIATRIC:  Normal affect   ASSESSMENT:    1. Coronary artery disease involving native coronary artery of native heart without angina pectoris   2. Essential hypertension   3. Mixed hyperlipidemia    PLAN:    In order of problems listed above:  Stable without symptoms of angina. Continue current Rx with ASA and statin drug.  BP uncontrolled. She brings in home readings consistently above goal (see scanned document of home readings). I have reviewed these. Discussed low sodium diet, exercise, and she is already compliant with this. Adding amlodipine 5 mg daily. She will send readings in for review in 4 weeks.  Treated with rosuvastatin. Last LDL 67 mg/dL. Labs followed by her PCP.       I will see her back in one year. Will refer to [facility] if difficulty controlling BP.      Medication Adjustments/Labs and Tests Ordered: Current medicines are reviewed at length with the patient today.  Concerns regarding medicines are outlined above.  Orders Placed This Encounter  Procedures   EKG 12-Lead   Meds ordered this encounter  Medications   amLODipine (NORVASC) 5 MG tablet    Sig: Take 1 tablet (5 mg total) by mouth daily.    Dispense:  90 tablet    Refill:  3    Patient Instructions  Medication Instructions:  Begin AMLODIPINE 5mg  once daily  *If  you need a refill on your cardiac medications before your next appointment, please call your pharmacy*   Lab Work: NONE  Testing/Procedures: NONE   Follow-Up: At [facility], you and your health needs are our priority.  As part of our continuing mission to provide you with exceptional heart care, we have created designated Provider Care Teams.  These Care Teams include your primary Cardiologist (physician) and Advanced Practice Providers (APPs -  Physician Assistants and Nurse Practitioners) who all work together to provide you with the care you need, when you need it.   Your next appointment:   1 year(s)  The format for your next appointment:   In Person  Provider:   Sherren Mocha, MD     Other Instructions Please check your blood pressure at home 3-4 times per week on same arm at approximately same time each day and keep a log. Please forward this log to [place] in approximately 1 month via MyChart.    Signed, Sherren Mocha, MD  04/15/2021 5:02 PM    [facility]

## 2021-04-15 NOTE — Patient Instructions (Signed)
Medication Instructions:  Begin AMLODIPINE 5mg  once daily  *If you need a refill on your cardiac medications before your next appointment, please call your pharmacy*   Lab Work: NONE  Testing/Procedures: NONE   Follow-Up: At [facility], you and your health needs are our priority.  As part of our continuing mission to provide you with exceptional heart care, we have created designated Provider Care Teams.  These Care Teams include your primary Cardiologist (physician) and Advanced Practice Providers (APPs -  Physician Assistants and Nurse Practitioners) who all work together to provide you with the care you need, when you need it.   Your next appointment:   1 year(s)  The format for your next appointment:   In Person  Provider:   Sherren Mocha, MD     Other Instructions Please check your blood pressure at home 3-4 times per week on same arm at approximately same time each day and keep a log. Please forward this log to [place] in approximately 1 month via MyChart.

## 2021-04-20 ENCOUNTER — Ambulatory Visit: Payer: PPO | Admitting: Cardiovascular Disease

## 2021-05-11 ENCOUNTER — Other Ambulatory Visit: Payer: Self-pay | Admitting: Cardiovascular Disease

## 2021-05-19 ENCOUNTER — Encounter: Payer: Self-pay | Admitting: Cardiovascular Disease

## 2021-05-26 LAB — HM DEXA SCAN

## 2021-06-11 ENCOUNTER — Ambulatory Visit
Admission: RE | Admit: 2021-06-11 | Discharge: 2021-06-11 | Disposition: A | Discharge status: Home / Self Care | Payer: PPO | Source: Ambulatory Visit | Attending: Obstetrics and Gynecology | Admitting: Obstetrics and Gynecology

## 2021-06-11 ENCOUNTER — Other Ambulatory Visit: Payer: Self-pay | Admitting: Obstetrics and Gynecology

## 2021-06-11 ENCOUNTER — Other Ambulatory Visit: Payer: Self-pay

## 2021-06-11 DIAGNOSIS — M545 Low back pain, unspecified: Secondary | ICD-10-CM

## 2021-09-03 ENCOUNTER — Other Ambulatory Visit: Payer: Self-pay | Admitting: Gastroenterology

## 2021-09-03 DIAGNOSIS — K219 Gastro-esophageal reflux disease without esophagitis: Secondary | ICD-10-CM

## 2021-09-11 ENCOUNTER — Other Ambulatory Visit: Payer: Self-pay | Admitting: Gastroenterology

## 2021-11-02 ENCOUNTER — Ambulatory Visit: Payer: PPO | Admitting: Gastroenterology

## 2021-11-02 ENCOUNTER — Encounter: Payer: Self-pay | Admitting: Gastroenterology

## 2021-11-02 VITALS — BP 114/58 | HR 88 | Ht 64.0 in | Wt 136.0 lb

## 2021-11-02 DIAGNOSIS — K219 Gastro-esophageal reflux disease without esophagitis: Secondary | ICD-10-CM

## 2021-11-02 DIAGNOSIS — K2100 Gastro-esophageal reflux disease with esophagitis, without bleeding: Secondary | ICD-10-CM | POA: Diagnosis not present

## 2021-11-02 MED ORDER — FAMOTIDINE 40 MG PO TABS: ORAL_TABLET | ORAL | 3 refills | Status: DC

## 2021-11-02 MED ORDER — PANTOPRAZOLE SODIUM 40 MG PO TBEC: 40.0000 mg | DELAYED_RELEASE_TABLET | Freq: Every day | ORAL | 3 refills | Status: DC

## 2021-11-02 NOTE — Patient Instructions (Signed)
We have sent the following medications to your pharmacy for you to pick up at your convenience: pantoprazole and famotidine.   The [facility] providers would like to encourage you to use MYCHART to communicate with providers for non-urgent requests or questions.  Due to long hold times on the telephone, sending your provider a message by MYCHART may be a faster and more efficient way to get a response.  Please allow 48 business hours for a response.  Please remember that this is for non-urgent requests.   Thank you for choosing me and [facility].  Malcolm T. Stark, Jr., MD., FACG   

## 2021-11-02 NOTE — Progress Notes (Signed)
    Assessment     GERD with LA Grade A reflux esophagitis CRC screening is up to date   Recommendations    Continue pantoprazole 40 mg po qd and famotidine 40 mg po hs. Follow antireflux measures.  REV in 1 year   HPI    This is an 81 year old female returning for follow-up of GERD.  She relates her reflux symptoms are under very good control on her current regimen.  She notes on rare occasions if she overeats or has certain foods her symptoms can be active. Denies weight loss, abdominal pain, constipation, diarrhea, change in stool caliber, melena, hematochezia, nausea, vomiting, dysphagia, chest pain.   EGD Oct. 2020 - LA Grade A reflux esophagitis. - Normal stomach. - Non-bleeding duodenal diverticulum. - No specimens collected   Labs / Imaging       Latest Ref Rng & Units 03/21/2019    8:18 AM 11/07/2018    3:03 PM 10/22/2018    8:12 PM  Hepatic Function  Total Protein 6.0 - 8.5 g/dL 6.9  6.8  7.3   Albumin 3.7 - 4.7 g/dL 4.8  4.4  4.4   AST 0 - 40 IU/L $Remov'26  15  24   'KAcKTZ$ ALT 0 - 32 IU/L $Remov'10  5  10   'Yxaevc$ Alk Phosphatase 39 - 117 IU/L 70  52  60   Total Bilirubin 0.0 - 1.2 mg/dL 0.4  0.3  0.7   Bilirubin, Direct 0.00 - 0.40 mg/dL 0.10          Latest Ref Rng & Units 11/07/2018    3:03 PM 10/22/2018    8:12 PM 10/15/2018    1:28 PM  CBC  WBC 4.0 - 10.5 K/uL 5.3  22.1  7.5   Hemoglobin 12.0 - 15.0 g/dL 12.8  13.6  14.0   Hematocrit 36.0 - 46.0 % 38.8  42.6  42.3   Platelets 150.0 - 400.0 K/uL 291.0  280  316     Current Medications, Allergies, Past Medical History, Past Surgical History, Family History and Social History were reviewed in [employer] record.   Physical Exam: General: Well developed, well nourished, no acute distress Head: Normocephalic and atraumatic Eyes: Sclerae anicteric, EOMI Ears: Normal auditory acuity Mouth: Not examined Lungs: Clear throughout to auscultation Heart: Regular rate and rhythm; no murmurs, rubs or  bruits Abdomen: Soft, non tender and non distended. No masses, hepatosplenomegaly or hernias noted. Normal Bowel sounds Rectal: Not done Musculoskeletal: Symmetrical with no gross deformities  Pulses:  Normal pulses noted Extremities: No clubbing, cyanosis, edema or deformities noted Neurological: Alert oriented x 4, grossly nonfocal Psychological:  Alert and cooperative. Normal mood and affect   Rubyann Lingle T. Fuller Plan, MD 11/02/2021, 1:53 PM

## 2022-03-16 ENCOUNTER — Other Ambulatory Visit: Payer: Self-pay | Admitting: Cardiovascular Disease

## 2022-04-18 ENCOUNTER — Encounter: Payer: Self-pay | Admitting: Cardiovascular Disease

## 2022-04-18 ENCOUNTER — Ambulatory Visit: Payer: PPO | Attending: Cardiovascular Disease | Admitting: Cardiovascular Disease

## 2022-04-18 VITALS — BP 110/60 | HR 81 | Ht 64.0 in | Wt 138.0 lb

## 2022-04-18 DIAGNOSIS — E782 Mixed hyperlipidemia: Secondary | ICD-10-CM | POA: Diagnosis not present

## 2022-04-18 DIAGNOSIS — I2510 Atherosclerotic heart disease of native coronary artery without angina pectoris: Secondary | ICD-10-CM | POA: Diagnosis not present

## 2022-04-18 DIAGNOSIS — I10 Essential (primary) hypertension: Secondary | ICD-10-CM

## 2022-04-18 NOTE — Patient Instructions (Signed)
Medication Instructions:  Your physician recommends that you continue on your current medications as directed. Please refer to the Current Medication list given to you today.  *If you need a refill on your cardiac medications before your next appointment, please call your pharmacy*   Lab Work: NONE If you have labs (blood work) drawn today and your tests are completely normal, you will receive your results only by: MyChart Message (if you have MyChart) OR A paper copy in the mail If you have any lab test that is abnormal or we need to change your treatment, we will call you to review the results.   Testing/Procedures: NONE   Follow-Up: At [facility], you and your health needs are our priority.  As part of our continuing mission to provide you with exceptional heart care, we have created designated Provider Care Teams.  These Care Teams include your primary Cardiologist (physician) and Advanced Practice Providers (APPs -  Physician Assistants and Nurse Practitioners) who all work together to provide you with the care you need, when you need it.  We recommend signing up for the patient portal called "MyChart".  Sign up information is provided on this After Visit Summary.  MyChart is used to connect with patients for Virtual Visits (Telemedicine).  Patients are able to view lab/test results, encounter notes, upcoming appointments, etc.  Non-urgent messages can be sent to your provider as well.   To learn more about what you can do with MyChart, go to [email or web address].    Your next appointment:   1 year(s)  Provider:   Michael Cooper, MD      

## 2022-04-18 NOTE — Progress Notes (Signed)
Cardiology Office Note:    Date:  04/18/2022   ID:  [name], DOB 1940-09-14, MRN [id number]  PCP:  Hayden Rasmussen, MD   [facility] Providers Cardiologist:  Sherren Mocha, MD     Referring MD: Hayden Rasmussen, MD   Chief Complaint  Patient presents with   Coronary Artery Disease    History of Present Illness:    [name] is a 82 y.o. female with a hx of coronary artery disease, presenting for follow-up evaluation.  The patient has undergone stenting of the LAD and right coronary arteries in 2012 using drug-eluting stents in each vessel.  She has done well since that time with no recurrent ischemic events.     The patient is here alone today. She is doing well. She continues to have some physical limitation related to back problems, but maintains a relatively active lifestyle with golf, light weights, and core exercises. She hasn't been walking for exercise as much as in the past. Today, she denies symptoms of palpitations, chest pain, shortness of breath, orthopnea, PND, lower extremity edema, dizziness, or syncope.  At the time of her visit last year her blood pressure was noted to be consistently above goal and she was started on amlodipine.  She has done well on this medication and reports much improved blood pressure control.   Past Medical History:  Diagnosis Date   Allergy    Anemia    Arthritis    CAD (coronary artery disease)    Dr Aundra Dubin   [facility])    [place] nose   Cataract    both eyes- surgically  removed   Diverticulosis of colon    GERD (gastroesophageal reflux disease)    Herpes zoster    1997 L flank   Hyperlipidemia    intol. to statins in past   Hypertension    Osteopenia    Dr Radene Knee, Gynecology   Osteopenia     Past Surgical History:  Procedure Laterality Date   CATARACT EXTRACTION  2010   OD  , Dr. Herbert Deaner   CATARACT EXTRACTION W/ INTRAOCULAR LENS IMPLANT Left 02/2014   Vision not significantly improved   COLONOSCOPY   2007   [facility]   CORONARY ANGIOPLASTY WITH STENT PLACEMENT  06/19/2010   2 vessel; Dr Burt Knack   LUMBAR DISC SURGERY  11/03/2014   VARICOSE VEIN SURGERY  1971    Current Medications: Current Meds  Medication Sig   acetaminophen (TYLENOL) 325 MG tablet Take 650 mg by mouth every 6 (six) hours as needed.   amLODipine (NORVASC) 5 MG tablet TAKE 1 TABLET (5 MG TOTAL) BY MOUTH DAILY.   aspirin 81 MG tablet Take 81 mg by mouth daily.   Black Currant Seed Oil 500 MG CAPS Take 1 capsule by mouth daily.   Calcium Citrate-Vitamin D (CITRUS CALCIUM 1500 + D PO) Take 1 capsule by mouth daily.   famotidine (PEPCID) 40 MG tablet TAKE 1 TABLET BY MOUTH EVERYDAY AT BEDTIME   Gelatin 600 MG CAPS Take 1 capsule by mouth daily.    Multiple Vitamin (MULTIVITAMIN) capsule Take 1 capsule by mouth daily.   nitroGLYCERIN (NITROSTAT) 0.4 MG SL tablet Place 0.4 mg under the tongue every 5 (five) minutes as needed for chest pain.   ondansetron (ZOFRAN) 4 MG tablet Take 1 tablet (4 mg total) by mouth every 8 (eight) hours as needed for nausea or vomiting.   pantoprazole (PROTONIX) 40 MG tablet Take 1 tablet (40  mg total) by mouth daily.   rosuvastatin (CRESTOR) 10 MG tablet TAKE 1 TABLET BY MOUTH EVERY DAY   saccharomyces boulardii (FLORASTOR) 250 MG capsule daily at 6 (six) AM.     Allergies:   Lisinopril, Metoprolol, Pravastatin sodium, Hydrocodone, and Oxycodone   Social History   Socioeconomic History   Marital status: Married    Spouse name: Not on file   Number of children: 4   Years of education: Not on file   Highest education level: Not on file  Occupational History   Not on file  Tobacco Use   Smoking status: Never   Smokeless tobacco: Never  Vaping Use   Vaping Use: Never used  Substance and Sexual Activity   Alcohol use: Yes    Comment: extremely rarely   Drug use: No   Sexual activity: Not on file  Other Topics Concern   Not on file  Social History Narrative   Exercise: hand  weight, crunches, some walking   Social Determinants of Health   Financial Resource Strain: Not on file  Food Insecurity: Not on file  Transportation Needs: Not on file  Physical Activity: Not on file  Stress: Not on file  Social Connections: Not on file     Family History: The patient's family history includes Asthma in her son; CVA in her paternal grandmother; CVA (age of onset: 46) in her mother; Cancer in her maternal grandmother; Deep vein thrombosis in her paternal grandmother; Diabetes in her paternal grandmother; Esophageal cancer (age of onset: 25) in her father; Heart attack (age of onset: 46) in her maternal uncle; Heart attack (age of onset: 81) in her father; Heart failure in her mother; Osteoarthritis in her mother; Pancreatic cancer in her paternal uncle; Skin cancer in her mother. There is no history of Colon cancer, Colon polyps, Rectal cancer, or Stomach cancer.  ROS:   Please see the history of present illness.    All other systems reviewed and are negative.  EKGs/Labs/Other Studies Reviewed:    The following studies were reviewed today: Myoview Scan 04/08/2020: The left ventricular ejection fraction is normal (55-65%). Nuclear stress EF: 65%. Blood pressure demonstrated a normal response to exercise. There was no ST segment deviation noted during stress. The study is normal. This is a low risk study.   Normal resting and stress perfusion. No ischemia or infarction EF 64%  EKG:  EKG is ordered today.  The ekg ordered today demonstrates NSR 81 bpm, within normal limits  Recent Labs: No results found for requested labs within last 365 days.  Recent Lipid Panel    Component Value Date/Time   CHOL 150 03/21/2019 0818   TRIG 103 03/21/2019 0818   TRIG 75 02/08/2006 1223   HDL 64 03/21/2019 0818   CHOLHDL 2.3 03/21/2019 0818   CHOLHDL 3 08/10/2017 0928   VLDL 20.2 08/10/2017 0928   LDLCALC 67 03/21/2019 0818   LDLDIRECT 158.8 05/24/2010 1013     Risk  Assessment/Calculations:                Physical Exam:    VS:  BP 110/60   Pulse 81   Ht '5\' 4"'$  (1.626 m)   Wt 138 lb (62.6 kg)   SpO2 97%   BMI 23.69 kg/m     Wt Readings from Last 3 Encounters:  04/18/22 138 lb (62.6 kg)  11/02/21 136 lb (61.7 kg)  04/15/21 134 lb 6.4 oz (61 kg)     GEN:  Well nourished,  well developed in no acute distress HEENT: Normal NECK: No JVD; No carotid bruits LYMPHATICS: No lymphadenopathy CARDIAC: RRR, no murmurs, rubs, gallops RESPIRATORY:  Clear to auscultation without rales, wheezing or rhonchi  ABDOMEN: Soft, non-tender, non-distended MUSCULOSKELETAL:  No edema; No deformity  SKIN: Warm and dry NEUROLOGIC:  Alert and oriented x 3 PSYCHIATRIC:  Normal affect   ASSESSMENT:    1. Coronary artery disease involving native coronary artery of native heart without angina pectoris   2. Essential hypertension   3. Mixed hyperlipidemia    PLAN:    In order of problems listed above:  The patient continues to do well with no symptoms of angina.  Her medical regimen includes aspirin for antiplatelet therapy statin drug.  I will see her back in 1 year for follow-up evaluation.  Last stress test from 2022 is reviewed and showed no ischemia. Blood pressure well-controlled on current regimen.  Last year she was started on amlodipine with marked improvement in her blood pressure.  She will continue on this medication. Treated with rosuvastatin.  Followed by her primary care physician.  Goal LDL cholesterol less than 70, ideally less than 55 mg/dL.  She has been stable now over a decade out from PCI.     Medication Adjustments/Labs and Tests Ordered: Current medicines are reviewed at length with the patient today.  Concerns regarding medicines are outlined above.  Orders Placed This Encounter  Procedures   EKG 12-Lead   No orders of the defined types were placed in this encounter.   Patient Instructions  Medication Instructions:  Your physician  recommends that you continue on your current medications as directed. Please refer to the Current Medication list given to you today.  *If you need a refill on your cardiac medications before your next appointment, please call your pharmacy*   Lab Work: NONE If you have labs (blood work) drawn today and your tests are completely normal, you will receive your results only by: [place] (if you have MyChart) OR A paper copy in the mail If you have any lab test that is abnormal or we need to change your treatment, we will call you to review the results.   Testing/Procedures: NONE   Follow-Up: At [facility], you and your health needs are our priority.  As part of our continuing mission to provide you with exceptional heart care, we have created designated Provider Care Teams.  These Care Teams include your primary Cardiologist (physician) and Advanced Practice Providers (APPs -  Physician Assistants and Nurse Practitioners) who all work together to provide you with the care you need, when you need it.  We recommend signing up for the patient portal called "MyChart".  Sign up information is provided on this After Visit Summary.  MyChart is used to connect with patients for Virtual Visits (Telemedicine).  Patients are able to view lab/test results, encounter notes, upcoming appointments, etc.  Non-urgent messages can be sent to your provider as well.   To learn more about what you can do with MyChart, go to [email or web address].    Your next appointment:   1 year(s)  Provider:   Sherren Mocha, MD        Signed, Sherren Mocha, MD  04/18/2022 12:49 PM    [place]

## 2022-05-13 ENCOUNTER — Other Ambulatory Visit: Payer: Self-pay | Admitting: Cardiovascular Disease

## 2022-08-04 ENCOUNTER — Telehealth: Payer: Self-pay | Admitting: *Deleted

## 2022-08-04 NOTE — Telephone Encounter (Signed)
LMOM for the patient to call the office back. Patient needs to be scheduled for new patient appt with Dr Pricilla Holm 5/23 or 5/24

## 2022-08-04 NOTE — Telephone Encounter (Signed)
Pt returned call.   Spoke with Ms. [name] regarding her referral to GYN oncology. She has an appointment scheduled with Dr. Pricilla Holm on Aug 25, 2022 at 0945. Patient agrees to date and time. She has been provided with office address and location. She is also aware of our mask and visitor policy. Patient verbalized understanding and will call with any questions.

## 2022-08-24 ENCOUNTER — Encounter: Payer: Self-pay | Admitting: Gynecologic Oncology

## 2022-08-24 NOTE — Progress Notes (Unsigned)
GYNECOLOGIC ONCOLOGY NEW PATIENT CONSULTATION   Patient Name: [name]  Patient Age: 82 y.o. Date of Service: 08/25/22 Referring Provider: Richardean Chimera, MD  Primary Care Provider: Dois Davenport, MD Consulting Provider: Eugene Garnet, MD   Assessment/Plan:  Postmenopausal patient with asymptomatic simple appearing adnexal cysts.  I spent some time with the patient discussing her past imaging.  We discussed her MRI back in 2015, CT scans in 2020, and recent ultrasound with her OB/GYN in April of this year.  While I am unable to see the ultrasound pictures, cysts are described as simple in appearance.  Additionally, although it is difficult to compare size measurements across imaging, there has been minimal if any change in size of these adnexal cysts since they were initially noted on MRI in 2015.  Patient also had a recent Ca1 25.  We discussed that this is not a diagnostic marker but can sometimes be elevated in the setting of ovarian cancer.  We discussed that up to 50% of early-stage ovarian cancer can have a normal CA125 and that many noncancerous disease processes such as diverticulosis can cause its elevation.  Although I cannot see her ultrasound, based on prior imaging within our system and ultrasound report, I do not feel that surgery is indicated.  Due to their simple nature and stable size, the cysts are almost certainly benign.  The patient was very happy to hear this and would like to avoid surgery if at all possible.  Patient asked about need for percutaneous drainage.  We discussed that sometimes this is pursued for symptom management, diagnostic purposes especially in the setting of someone who cannot undergo definitive surgery.  Given her lack of symptoms as well as no urgent need from a diagnostic standpoint, I would not recommend percutaneous drainage.  I think very likely, even if successful, the cyst would likely reaccumulate.  Although I think it is unlikely, we  reviewed torsion precautions.  I stressed the importance of calling her OB/GYN or my office if she were to develop acute pain and/or other symptoms such as nausea and vomiting.  In terms of follow-up, I encouraged the patient to follow-up with her OB/GYN.  I do not think that additional imaging is necessary but it would not be unreasonable to get an ultrasound for surveillance next year.  Any change in symptoms should prompt imaging sooner.  A copy of this note was sent to the patient's referring provider.   55 minutes of total time was spent for this patient encounter, including preparation, face-to-face counseling with the patient and coordination of care, and documentation of the encounter.  Eugene Garnet, MD  Division of Gynecologic Oncology  Department of Obstetrics and Gynecology  [facility]  ___________________________________________  Chief Complaint: No chief complaint on file.   History of Present Illness:  [name] is a 82 y.o. y.o. female who is seen in consultation at the request of Dr. Arelia Sneddon for an evaluation of bilateral adnexal masses.  12/2018: CT A/P Sigmoid diverticulosis, without evidence of diverticulitis. Chronic bilateral ovarian cystic lesions (5.4 x 4.1 cm right ovarian cyst (series 2/image 65), previously 5.0 x 4.0 cm. 3.6 x 3.0 cm left ovarian cyst (series 2/image 62), previously 3.4 x 2.8 cm.) . Annual follow-up ultrasound is suggested in a postmenopausal patient.  Cysts are noted as chronically stable, similar size on MRI 2015.  [facility] shows a uterus measuring 5.5 x 3.7 x 1.9 cm.  Left ovary measures 6.2 x 3.6 x 6.6 cm and has 2 cysts, one measuring up to 5.7 cm and 1 measuring up to 2.7 cm.  Left ovary measures 5.7 x 2.6 x 3.5 cm.  Left ovarian cyst measures up to 3.4 cm and a second cyst measuring up to 2.8 cm.  Impression is that bilateral ovaries contain simple appearing cyst  with minimal change in size from last ultrasound.  CA-125 was performed on 4/25 and normal at 14.1.  Patient presents today noting that she feels very good.  She has arthritis and some underlying joint and back pain.  She denies any pelvic symptoms including pressure or pain.  She has a history of diverticular disease with 2 bouts of diverticulitis treated previously.  Notes normal bowel function currently.  Denies any urinary symptoms.  She denies any vaginal bleeding or discharge.  PAST MEDICAL HISTORY:  Past Medical History:  Diagnosis Date   Allergy    Anemia    Arthritis    CAD (coronary artery disease)    Dr Shirlee Latch   Cancer [facility])    BCC nose   Cataract    both eyes- surgically  removed   Diverticulosis of colon    GERD (gastroesophageal reflux disease)    Herpes zoster    1997 L flank   Hyperlipidemia    intol. to statins in past   Hypertension    Osteopenia    Dr Arelia Sneddon, Gynecology   Osteopenia    Scoliosis      PAST SURGICAL HISTORY:  Past Surgical History:  Procedure Laterality Date   CATARACT EXTRACTION  2010   OD  , Dr. Elmer Picker   CATARACT EXTRACTION W/ INTRAOCULAR LENS IMPLANT Left 02/2014   Vision not significantly improved   COLONOSCOPY  2007   [facility]   CORONARY ANGIOPLASTY WITH STENT PLACEMENT  06/19/2010   2 vessel; Dr Excell Seltzer   LUMBAR DISC SURGERY  11/03/2014   VARICOSE VEIN SURGERY  1971    OB/GYN HISTORY:  OB History  Gravida Para Term Preterm AB Living  5 4          SAB IAB Ectopic Multiple Live Births               # Outcome Date GA Lbr Len/2nd Weight Sex Delivery Anes PTL Lv  5 Gravida           4 Para           3 Para           2 Para           1 Para             No LMP recorded. Patient is postmenopausal.  Age at menarche: 50  Age at menopause: [age] Hx of HRT: Denies Hx of STDs: Denies Last pap: 06/2021 History of abnormal pap smears: no  SCREENING STUDIES:  Last mammogram: 07/2022  Last colonoscopy: 2019 per chart  review.  Patient notes that she has had a colonoscopy within the last 2 years. Last bone mineral density: 2023  MEDICATIONS: Outpatient Encounter Medications as of 08/25/2022  Medication Sig   acetaminophen (TYLENOL) 325 MG tablet Take 650 mg by mouth every 6 (six) hours as needed.   amLODipine (NORVASC) 5 MG tablet TAKE 1 TABLET (5 MG TOTAL) BY MOUTH DAILY.   aspirin 81 MG tablet Take 81 mg by mouth daily.   Black Currant Seed Oil 500 MG CAPS Take 1 capsule by mouth daily.  Calcium Citrate-Vitamin D (CITRUS CALCIUM 1500 + D PO) Take 1 capsule by mouth daily.   famotidine (PEPCID) 40 MG tablet TAKE 1 TABLET BY MOUTH EVERYDAY AT BEDTIME   Gelatin 600 MG CAPS Take 1 capsule by mouth daily.    Multiple Vitamin (MULTIVITAMIN) capsule Take 1 capsule by mouth daily.   nitroGLYCERIN (NITROSTAT) 0.4 MG SL tablet Place 0.4 mg under the tongue every 5 (five) minutes as needed for chest pain.   ondansetron (ZOFRAN) 4 MG tablet Take 1 tablet (4 mg total) by mouth every 8 (eight) hours as needed for nausea or vomiting.   pantoprazole (PROTONIX) 40 MG tablet Take 1 tablet (40 mg total) by mouth daily.   PROLIA 60 MG/ML SOSY injection Inject 60 mg into the skin every 6 (six) months.   rosuvastatin (CRESTOR) 10 MG tablet TAKE 1 TABLET BY MOUTH EVERY DAY   saccharomyces boulardii (FLORASTOR) 250 MG capsule daily at 6 (six) AM.   No facility-administered encounter medications on file as of 08/25/2022.    ALLERGIES:  Allergies  Allergen Reactions   Lisinopril     Diffuse itching w/o rash or fever   Metoprolol     Shortness of breath   Hydrocodone Nausea And Vomiting   Pravastatin Sodium     REACTION: muscle pain in arms 2009   Oxycodone Nausea And Vomiting     FAMILY HISTORY:  Family History  Problem Relation Age of Onset   Osteoarthritis Mother    Skin cancer Mother        ? melanoma   Heart failure Mother    CVA Mother 41   Esophageal cancer Father 88   Heart attack Father 6        smoker   Cancer Maternal Grandmother        ? GI   CVA Paternal Grandmother    Diabetes Paternal Grandmother        prediabetic   Deep vein thrombosis Paternal Grandmother    Asthma Son        EIB   Heart attack Maternal Uncle 95   Pancreatic disease Paternal Uncle        unsure if failure or cancer   Stomach cancer Maternal Aunt    Colon cancer Neg Hx    Colon polyps Neg Hx    Rectal cancer Neg Hx    Breast cancer Neg Hx    Endometrial cancer Neg Hx    Ovarian cancer Neg Hx    Prostate cancer Neg Hx      SOCIAL HISTORY:  Social Connections: Socially Integrated (08/24/2022)   Social Connection and Isolation Panel [NHANES]    Frequency of Communication with Friends and Family: More than three times a week    Frequency of Social Gatherings with Friends and Family: Three times a week    Attends Religious Services: 1 to 4 times per year    Active Member of Clubs or Organizations: Yes    Attends Engineer, structural: More than 4 times per year    Marital Status: Married    REVIEW OF SYSTEMS:  + Intermittent ringing in the ears, joint pain, intermittent back pain. Denies appetite changes, fevers, chills, fatigue, unexplained weight changes. Denies hearing loss, neck lumps or masses, mouth sores or voice changes. Denies cough or wheezing.  Denies shortness of breath. Denies chest pain or palpitations. Denies leg swelling. Denies abdominal distention, pain, blood in stools, constipation, diarrhea, nausea, vomiting, or early satiety. Denies pain with intercourse, dysuria, frequency,  hematuria or incontinence. Denies hot flashes, pelvic pain, vaginal bleeding or vaginal discharge.   Denies muscle pain/cramps. Denies itching, rash, or wounds. Denies dizziness, headaches, numbness or seizures. Denies swollen lymph nodes or glands, denies easy bruising or bleeding. Denies anxiety, depression, confusion, or decreased concentration.  Physical Exam:  Vital Signs for this  encounter:  Blood pressure (!) 142/74, pulse 80, temperature 98 F (36.7 C), temperature source Oral, resp. rate 16, height 5' 4.96" (1.65 m), weight 134 lb 9.6 oz (61.1 kg), SpO2 98 %. Body mass index is 22.43 kg/m. General: Alert, oriented, no acute distress.  HEENT: Normocephalic, atraumatic. Sclera anicteric.  Chest: Clear to auscultation bilaterally. No wheezes, rhonchi, or rales. Cardiovascular: Regular rate and rhythm, no murmurs, rubs, or gallops.  Abdomen: Normoactive bowel sounds. Soft, nondistended, nontender to palpation. No masses or hepatosplenomegaly appreciated. No palpable fluid wave.  Extremities: Grossly normal range of motion. Warm, well perfused. No edema bilaterally.  Skin: No rashes or lesions.  Lymphatics: No cervical, supraclavicular, or inguinal adenopathy.  GU:  Normal external female genitalia. No lesions. No discharge or bleeding.             Bladder/urethra:  No lesions or masses, well supported bladder             Vagina: Moderately atrophic, no lesions noted.             Cervix: Normal appearing, no lesions.             Uterus:  Small, mobile, no parametrial involvement or nodularity.             Adnexa: 5-6 cm smooth and mobile mass within the cul-de-sac to the right, left adnexa not able to be palpated on vaginal exam.    Rectal: Deferred.  LABORATORY AND RADIOLOGIC DATA:  Outside medical records were reviewed to synthesize the above history, along with the history and physical obtained during the visit.

## 2022-08-25 ENCOUNTER — Encounter: Payer: Self-pay | Admitting: Gynecologic Oncology

## 2022-08-25 ENCOUNTER — Inpatient Hospital Stay: Payer: PPO | Attending: Gynecologic Oncology | Admitting: Gynecologic Oncology

## 2022-08-25 ENCOUNTER — Other Ambulatory Visit: Payer: Self-pay

## 2022-08-25 VITALS — BP 142/74 | HR 80 | Temp 98.0°F | Resp 16 | Ht 64.96 in | Wt 134.6 lb

## 2022-08-25 DIAGNOSIS — M1990 Unspecified osteoarthritis, unspecified site: Secondary | ICD-10-CM | POA: Diagnosis not present

## 2022-08-25 DIAGNOSIS — N83201 Unspecified ovarian cyst, right side: Secondary | ICD-10-CM

## 2022-08-25 DIAGNOSIS — Z780 Asymptomatic menopausal state: Secondary | ICD-10-CM

## 2022-08-25 DIAGNOSIS — N83202 Unspecified ovarian cyst, left side: Secondary | ICD-10-CM | POA: Diagnosis not present

## 2022-08-25 NOTE — Patient Instructions (Signed)
It was very nice to meet you today.    We discussed your recent ultrasound as well as reviewed CT scan from 2020 and an MRI from 2015.  When we look at the images within our system (I cannot see your recent ultrasound from your OB/GYN's office), your ovarian cysts look simple, meaning they look like a water balloon.  This is very reassuring.  Additionally, there has been minimal if any change in size to the cyst over a period of approximately 9 years.  This all supports cyst very likely being benign.  If you were to start having symptoms as we discussed today, please call your OB/GYN or my clinic.  Otherwise, I would recommend continued yearly follow-up with your OB/GYN.  While I do not think that further imaging with pelvic ultrasounds is necessary, I do not think it is unreasonable to continue to have a yearly ultrasound to monitor the size of the cysts.  Please do not hesitate to call my clinic if you have any questions or concerns.  Our number is [phone].

## 2022-09-09 ENCOUNTER — Other Ambulatory Visit: Payer: Self-pay | Admitting: Gastroenterology

## 2022-09-09 DIAGNOSIS — K219 Gastro-esophageal reflux disease without esophagitis: Secondary | ICD-10-CM

## 2022-10-14 ENCOUNTER — Encounter: Payer: Self-pay | Admitting: Gynecologic Oncology

## 2022-12-08 ENCOUNTER — Other Ambulatory Visit: Payer: Self-pay | Admitting: Gastroenterology

## 2023-02-08 ENCOUNTER — Ambulatory Visit: Payer: PPO | Admitting: Gastroenterology

## 2023-02-08 ENCOUNTER — Encounter: Payer: Self-pay | Admitting: Gastroenterology

## 2023-02-08 DIAGNOSIS — K219 Gastro-esophageal reflux disease without esophagitis: Secondary | ICD-10-CM | POA: Diagnosis not present

## 2023-02-08 MED ORDER — PANTOPRAZOLE SODIUM 40 MG PO TBEC: DELAYED_RELEASE_TABLET | ORAL | 1 refills | Status: DC

## 2023-02-08 NOTE — Progress Notes (Signed)
    Assessment     GERD with LA Grade A esophagitis  CRC screening is up-to-date    Recommendations    Increase pantoprazole to 40 mg bid for 1 month and then resume every day, continue famotidne 40 mg hs, follow antireflux measures, discontinue Pepto-Bismol for breakthrough, Tums or Maalox for breakthrough symptoms.  Contact us in 2 to 3 weeks if symptoms not adequately controlled.  Consider changing PPI and EGD.  REV in 1 year   HPI    This is an 82 year old female with GERD with LA Grade A esophagitis.  Her reflux symptoms have been under good control on her current regimen until the past month when she has had several prolonged episodes of heartburn.  She takes Pepto-Bismol for breakthrough symptoms which has not been as effective.  Her husband recently suffered a small stroke with visual impairment however he is making a very good recovery.  No dysphagia, odynophagia, weight loss, melena, hematochezia.   Labs / Imaging       Latest Ref Rng & Units 03/21/2019    8:18 AM 11/07/2018    3:03 PM 10/22/2018    8:12 PM  Hepatic Function  Total Protein 6.0 - 8.5 g/dL 6.9  6.8  7.3   Albumin 3.7 - 4.7 g/dL 4.8  4.4  4.4   AST 0 - 40 IU/L 26  15  24    ALT 0 - 32 IU/L 10  5  10    Alk Phosphatase 39 - 117 IU/L 70  52  60   Total Bilirubin 0.0 - 1.2 mg/dL 0.4  0.3  0.7   Bilirubin, Direct 0.00 - 0.40 mg/dL 4.09          Latest Ref Rng & Units 11/07/2018    3:03 PM 10/22/2018    8:12 PM 10/15/2018    1:28 PM  CBC  WBC 4.0 - 10.5 K/uL 5.3  22.1  7.5   Hemoglobin 12.0 - 15.0 g/dL 81.1  91.4  78.2   Hematocrit 36.0 - 46.0 % 38.8  42.6  42.3   Platelets 150.0 - 400.0 K/uL 291.0  280  316    Current Medications, Allergies, Past Medical History, Past Surgical History, Family History and Social History were reviewed in [employer] record.   Physical Exam: General: Well developed, well nourished, no acute distress Head: Normocephalic and atraumatic Eyes: Sclerae  anicteric, EOMI Ears: Normal auditory acuity Mouth: No deformities or lesions noted Lungs: Clear throughout to auscultation Heart: Regular rate and rhythm; No murmurs, rubs or bruits Abdomen: Soft, non tender and non distended. No masses, hepatosplenomegaly or hernias noted. Normal Bowel sounds Rectal: Not done Musculoskeletal: Symmetrical with no gross deformities  Pulses:  Normal pulses noted Extremities: No edema or deformities noted Neurological: Alert oriented x 4, grossly nonfocal Psychological:  Alert and cooperative. Normal mood and affect   [name] T. Russella Dar, MD 02/08/2023, 2:48 PM

## 2023-02-08 NOTE — Patient Instructions (Signed)
We have sent the following medications to your pharmacy for you to pick up at your convenience: pantoprazole 40 mg twice daily x 1 month then decrease to once daily.   If this does not help your reflux symptoms, then please contact our office.   The [facility] providers would like to encourage you to use [facility] to communicate with providers for non-urgent requests or questions.  Due to long hold times on the telephone, sending your provider a message by [facility] may be a faster and more efficient way to get a response.  Please allow 48 business hours for a response.  Please remember that this is for non-urgent requests.   Thank you for choosing me and [facility].  Venita Lick. Pleas Koch., MD., Clementeen Graham

## 2023-03-06 ENCOUNTER — Other Ambulatory Visit: Payer: Self-pay | Admitting: Gastroenterology

## 2023-03-28 ENCOUNTER — Other Ambulatory Visit: Payer: Self-pay | Admitting: Gastroenterology

## 2023-03-28 DIAGNOSIS — K219 Gastro-esophageal reflux disease without esophagitis: Secondary | ICD-10-CM

## 2023-04-05 HISTORY — PX: HYSTEROTOMY: SHX1776

## 2023-05-05 ENCOUNTER — Encounter: Payer: Self-pay | Admitting: *Deleted

## 2023-05-08 ENCOUNTER — Encounter: Payer: Self-pay | Admitting: Cardiovascular Disease

## 2023-05-08 ENCOUNTER — Ambulatory Visit: Payer: PPO | Attending: Cardiovascular Disease | Admitting: Cardiovascular Disease

## 2023-05-08 VITALS — BP 130/90 | HR 90 | Ht 64.0 in | Wt 133.4 lb

## 2023-05-08 DIAGNOSIS — E782 Mixed hyperlipidemia: Secondary | ICD-10-CM | POA: Diagnosis not present

## 2023-05-08 DIAGNOSIS — I10 Essential (primary) hypertension: Secondary | ICD-10-CM | POA: Diagnosis not present

## 2023-05-08 DIAGNOSIS — I2510 Atherosclerotic heart disease of native coronary artery without angina pectoris: Secondary | ICD-10-CM

## 2023-05-08 NOTE — Patient Instructions (Signed)
 Follow-Up: At [facility], you and your health needs are our priority.  As part of our continuing mission to provide you with exceptional heart care, we have created designated Provider Care Teams.  These Care Teams include your primary Cardiologist (physician) and Advanced Practice Providers (APPs -  Physician Assistants and Nurse Practitioners) who all work together to provide you with the care you need, when you need it.  We recommend signing up for the patient portal called "MyChart".  Sign up information is provided on this After Visit Summary.  MyChart is used to connect with patients for Virtual Visits (Telemedicine).  Patients are able to view lab/test results, encounter notes, upcoming appointments, etc.  Non-urgent messages can be sent to your provider as well.   To learn more about what you can do with MyChart, go to [email or web address].    Your next appointment:   1 year(s)  Provider:   Tonny Bollman, MD     Other Instructions   1st Floor: - Lobby - Registration  - Pharmacy  - Lab - Cafe  2nd Floor: - PV Lab - Diagnostic Testing (echo, CT, nuclear med)  3rd Floor: - Vacant  4th Floor: - TCTS (cardiothoracic surgery) - AFib Clinic - Structural Heart Clinic - Vascular Surgery  - Vascular Ultrasound  5th Floor: - [facility] (general and EP) - Clinical Pharmacy for coumadin, hypertension, lipid, weight-loss medications, and med management appointments    Valet parking services will be available as well.

## 2023-05-08 NOTE — Progress Notes (Signed)
Cardiology Office Note:    Date:  05/08/2023   ID:  [name], DOB 01/03/1941, MRN [id number]  PCP:  Dois Davenport, MD   [facility] Providers Cardiologist:  Tonny Bollman, MD     Referring MD: Dois Davenport, MD   Chief Complaint  Patient presents with   Hypertension    History of Present Illness:    [name] is a 83 y.o. female with a hx of coronary artery disease, presenting for follow-up evaluation.  The patient has undergone stenting of the LAD and right coronary arteries in 2012 using drug-eluting stents in each vessel.  She has done well since that time with no recurrent ischemic events.     She is here alone today. She stays active, still walking 9 holes of golf a few times per week and she lifts light weights. Today, she denies symptoms of palpitations, shortness of breath, orthopnea, PND, lower extremity edema, dizziness, or syncope. Three weeks ago, she had an episode of aching in her chest, under the year, felt that the left breast at 3am.  It resolved spontaneously within 20 minutes and hasn't recurred.   Current Medications: Current Meds  Medication Sig   acetaminophen (TYLENOL) 325 MG tablet Take 650 mg by mouth every 6 (six) hours as needed.   amLODipine (NORVASC) 5 MG tablet TAKE 1 TABLET (5 MG TOTAL) BY MOUTH DAILY.   aspirin 81 MG tablet Take 81 mg by mouth daily.   Black Currant Seed Oil 500 MG CAPS Take 1 capsule by mouth daily.   Calcium Citrate-Vitamin D (CITRUS CALCIUM 1500 + D PO) Take 1 capsule by mouth daily.   famotidine (PEPCID) 40 MG tablet TAKE 1 TABLET BY MOUTH EVERYDAY AT BEDTIME   Gelatin 600 MG CAPS Take 1 capsule by mouth daily.    Multiple Vitamin (MULTIVITAMIN) capsule Take 1 capsule by mouth daily.   nitroGLYCERIN (NITROSTAT) 0.4 MG SL tablet Place 0.4 mg under the tongue every 5 (five) minutes as needed for chest pain.   ondansetron (ZOFRAN) 4 MG tablet Take 1 tablet (4 mg total) by mouth every 8 (eight) hours as  needed for nausea or vomiting.   pantoprazole (PROTONIX) 40 MG tablet TAKE ONE TABLET BY MOUTH TWICE DAILY X 1 MONTH THEN DECREASE TO ONCE DAILY.   PROLIA 60 MG/ML SOSY injection Inject 60 mg into the skin every 6 (six) months.   rosuvastatin (CRESTOR) 10 MG tablet TAKE 1 TABLET BY MOUTH EVERY DAY   saccharomyces boulardii (FLORASTOR) 250 MG capsule daily at 6 (six) AM.     Allergies:   Lisinopril, Metoprolol, Hydrocodone, Pravastatin sodium, and Oxycodone   ROS:   Please see the history of present illness.    All other systems reviewed and are negative.  EKGs/Labs/Other Studies Reviewed:    The following studies were reviewed today: Cardiac Studies & Procedures     STRESS TESTS  MYOCARDIAL PERFUSION IMAGING 04/08/2020  Narrative  The left ventricular ejection fraction is normal (55-65%).  Nuclear stress EF: 65%.  Blood pressure demonstrated a normal response to exercise.  There was no ST segment deviation noted during stress.  The study is normal.  This is a low risk study.  Normal resting and stress perfusion. No ischemia or infarction EF 64%              EKG:   EKG Interpretation Date/Time:  Monday May 08 2023 10:11:31 EST Ventricular Rate:  90 PR Interval:  134 QRS Duration:  80 QT Interval:  356 QTC Calculation: 435 R Axis:   44  Text Interpretation: Normal sinus rhythm Nonspecific ST abnormality When compared with ECG of 15-Oct-2018 13:36, PREVIOUS ECG IS PRESENTthe heart rate has increased from 66 bpm to 90 bpm, otherwise no significant change Confirmed by Tonny Bollman [phone]) on 05/08/2023 10:27:03 AM    Recent Labs: No results found for requested labs within last 365 days.  Recent Lipid Panel    Component Value Date/Time   CHOL 150 03/21/2019 0818   TRIG 103 03/21/2019 0818   TRIG 75 02/08/2006 1223   HDL 64 03/21/2019 0818   CHOLHDL 2.3 03/21/2019 0818   CHOLHDL 3 08/10/2017 0928   VLDL 20.2 08/10/2017 0928   LDLCALC 67 03/21/2019 0818    LDLDIRECT 158.8 05/24/2010 1013         Physical Exam:    VS:  BP (!) 130/90   Pulse 90   Ht 5\' 4"  (1.626 m)   Wt 133 lb 6.4 oz (60.5 kg)   SpO2 98%   BMI 22.90 kg/m     Wt Readings from Last 3 Encounters:  05/08/23 133 lb 6.4 oz (60.5 kg)  02/08/23 132 lb (59.9 kg)  08/25/22 134 lb 9.6 oz (61.1 kg)     GEN:  Well nourished, well developed in no acute distress HEENT: Normal NECK: No JVD; No carotid bruits LYMPHATICS: No lymphadenopathy CARDIAC: RRR, no murmurs, rubs, gallops RESPIRATORY:  Clear to auscultation without rales, wheezing or rhonchi  ABDOMEN: Soft, non-tender, non-distended MUSCULOSKELETAL:  No edema; No deformity  SKIN: Warm and dry NEUROLOGIC:  Alert and oriented x 3 PSYCHIATRIC:  Normal affect   Assessment & Plan Coronary artery disease involving native heart without angina pectoris, unspecified vessel or lesion type Patient continues to do well with no symptoms of exertional angina.  She had an isolated episode of chest discomfort at night that resolved spontaneously and has not recurred.  I do not think this was an anginal episode but she will keep an eye on it.  If symptoms recur I would consider stress testing.  For now, we will continue her on aspirin for antiplatelet therapy, amlodipine, and rosuvastatin.  No changes are made today. Essential hypertension On my recheck the patient's blood pressure is 118/68.  Continue current management.  Treated with amlodipine.  She reports home readings in the 120s over 70s generally. Mixed hyperlipidemia Treated with rosuvastatin.  Goal LDL cholesterol less than 70 mg/dL.  Lipids followed by PCP.      Medication Adjustments/Labs and Tests Ordered: Current medicines are reviewed at length with the patient today.  Concerns regarding medicines are outlined above.  Orders Placed This Encounter  Procedures   EKG 12-Lead   No orders of the defined types were placed in this encounter.   Patient Instructions   Follow-Up: At [facility], you and your health needs are our priority.  As part of our continuing mission to provide you with exceptional heart care, we have created designated Provider Care Teams.  These Care Teams include your primary Cardiologist (physician) and Advanced Practice Providers (APPs -  Physician Assistants and Nurse Practitioners) who all work together to provide you with the care you need, when you need it.  We recommend signing up for the patient portal called "MyChart".  Sign up information is provided on this After Visit Summary.  MyChart is used to connect with patients for Virtual Visits (Telemedicine).  Patients are able to view lab/test results, encounter notes, upcoming  appointments, etc.  Non-urgent messages can be sent to your provider as well.   To learn more about what you can do with MyChart, go to [email or web address].    Your next appointment:   1 year(s)  Provider:   Tonny Bollman, MD     Other Instructions   1st Floor: - Lobby - Registration  - Pharmacy  - Lab - Cafe  2nd Floor: - PV Lab - Diagnostic Testing (echo, CT, nuclear med)  3rd Floor: - Vacant  4th Floor: - TCTS (cardiothoracic surgery) - AFib Clinic - Structural Heart Clinic - Vascular Surgery  - Vascular Ultrasound  5th Floor: - HeartCare Cardiology (general and EP) - Clinical Pharmacy for coumadin, hypertension, lipid, weight-loss medications, and med management appointments    Valet parking services will be available as well.          Signed, Tonny Bollman, MD  05/08/2023 1:40 PM    [facility]

## 2023-05-08 NOTE — Assessment & Plan Note (Signed)
Patient continues to do well with no symptoms of exertional angina.  She had an isolated episode of chest discomfort at night that resolved spontaneously and has not recurred.  I do not think this was an anginal episode but she will keep an eye on it.  If symptoms recur I would consider stress testing.  For now, we will continue her on aspirin for antiplatelet therapy, amlodipine, and rosuvastatin.  No changes are made today.

## 2023-05-08 NOTE — Assessment & Plan Note (Signed)
Treated with rosuvastatin.  Goal LDL cholesterol less than 70 mg/dL.  Lipids followed by PCP.

## 2023-05-16 NOTE — Progress Notes (Unsigned)
Rubin Payor, PhD, LAT, ATC acting as a scribe for Clementeen Graham, MD.  [name] is a 83 y.o. female who presents to [facility] at [facility] today for osteoporosis management. Family hx of osteoporosis, mom  DEXA scan (date, T-score): 05/26/21: Spine= -0.6, L-FN= -2.5, R-FN= -2.3 Prior treatment: Prolia- 1 shot, around last April History of Hip, Spine, or Wrist Fx: no Heart disease or stroke: yes- heart disease, 2 stents Cancer: no Kidney Disease: slightly abnormal kidney labs Gastric/Peptic Ulcer: no Gastric bypass surgery: no Severe GERD: yes- on pantoprazole and IBS Hx of seizures: no Age at Menopause: 83y/o Calcium intake: yes Vitamin D intake: yes Hormone replacement therapy: no Smoking history: never smoke Alcohol: occasionally Exercise: yes: weights bid, walking Major dental work in past year: yes- implant last fall Parents with hip/spine fracture: mom- hip fx Height loss: yes: 65.5" to 64"  Additionally she notes chronic left knee pain.  Pain is located in the lateral aspect of the knee and worse with activity.  She notes some clicking and popping.  She has used Tylenol but has not tried other treatments yet.   Pertinent review of systems: No fevers or chills  Relevant historical information: CAD   Exam:  BP 128/70   Pulse 79   Ht 5\' 4"  (1.626 m)   Wt 137 lb (62.1 kg)   SpO2 99%   BMI 23.52 kg/m  General: Well Developed, well nourished, and in no acute distress.   MSK: Left knee normal-appearing normal motion.  Tender palpation lateral joint line. Stable ligamentous exam. Intact strength.    Lab and Radiology Results  X-ray images left knee obtained today personally and independently interpreted. Mild DJD. Await formal radiology review   [facility] Renal function panel May 05, 2023 Creatinine 1.06 with GFR of 53. Sodium 140, potassium 4.5, chloride 105, carbon dioxide 26, calcium 9.4, phosphorus  3.5, albumin 4.5  DEXA scan dated May 26, 2021 left femoral neck T-score -2.5     Assessment and Plan: 83 y.o. female with osteoporosis.  She did get started on Prolia but her second dose was held as her GFR was less than 60.  Prolia can be given with GFR greater than 30.  Apparently her OB/GYN office already purchased and she already paid for the copayment for Prolia.  It is okay to give that Prolia now.  Happy to take over Prolia in the future if that is what her OB/GYN desires.  Go ahead and check vitamin D today but labs obtained by OB/GYN office recently are okay for Prolia.  As for the left knee pain: She does have some mild DJD on x-ray.  Pain due to DJD with possibly degenerative lateral meniscus tear.  Plan for Voltaren gel and compression sleeve and Tylenol arthritis.  Avoid oral NSAIDs given CKD 3 and heart disease.   PDMP not reviewed this encounter. Orders Placed This Encounter  Procedures   DG Knee AP/LAT W/Sunrise Left    Standing Status:   Future    Number of Occurrences:   1    Expiration Date:   06/15/2023    Reason for Exam (SYMPTOM  OR DIAGNOSIS REQUIRED):   left knee pain    Preferred imaging location?:   [facility]   VITAMIN D 25 Hydroxy (Vit-D Deficiency, Fractures)    Standing Status:   Future    Number of Occurrences:   1    Expiration Date:   05/17/2024  No orders of the defined types were placed in this encounter.    Discussed warning signs or symptoms. Please see discharge instructions. Patient expresses understanding.   The above documentation has been reviewed and is accurate and complete Clementeen Graham, M.D.

## 2023-05-18 ENCOUNTER — Ambulatory Visit: Payer: PPO | Admitting: Family Medicine

## 2023-05-18 ENCOUNTER — Ambulatory Visit (INDEPENDENT_AMBULATORY_CARE_PROVIDER_SITE_OTHER): Payer: PPO

## 2023-05-18 ENCOUNTER — Encounter: Payer: Self-pay | Admitting: Family Medicine

## 2023-05-18 VITALS — BP 128/70 | HR 79 | Ht 64.0 in | Wt 137.0 lb

## 2023-05-18 DIAGNOSIS — M1712 Unilateral primary osteoarthritis, left knee: Secondary | ICD-10-CM

## 2023-05-18 DIAGNOSIS — M25562 Pain in left knee: Secondary | ICD-10-CM

## 2023-05-18 DIAGNOSIS — G8929 Other chronic pain: Secondary | ICD-10-CM

## 2023-05-18 DIAGNOSIS — M810 Age-related osteoporosis without current pathological fracture: Secondary | ICD-10-CM | POA: Diagnosis not present

## 2023-05-18 LAB — VITAMIN D 25 HYDROXY (VIT D DEFICIENCY, FRACTURES): VITD: 36.55 ng/mL (ref 30.00–100.00)

## 2023-05-18 NOTE — Patient Instructions (Addendum)
Thank you for coming in today.   Please get labs today before you leave   Please get an Xray today before you leave   Please use Voltaren gel (Generic Diclofenac Gel) up to 4x daily for pain as needed.  This is available over-the-counter as both the name brand Voltaren gel and the generic diclofenac gel.   I recommend you obtained a compression sleeve to help with your joint problems. There are many options on the market however I recommend obtaining a Full Knee Body Helix compression sleeve.  You can find information (including how to appropriate measure yourself for sizing) can be found at www.[employer].ch.  Many of these products are health savings account (HSA) eligible. You can use the compression sleeve at any time throughout the day but is most important to use while being active as well as for 2 hours post-activity.   It is appropriate to ice following activity with the compression sleeve in place.   Check back as needed

## 2023-05-22 ENCOUNTER — Encounter: Payer: Self-pay | Admitting: Family Medicine

## 2023-05-22 NOTE — Progress Notes (Signed)
Vitamin D level is adequate

## 2023-05-31 ENCOUNTER — Other Ambulatory Visit: Payer: Self-pay | Admitting: Cardiovascular Disease

## 2023-06-01 ENCOUNTER — Encounter: Payer: Self-pay | Admitting: Family Medicine

## 2023-06-01 NOTE — Progress Notes (Signed)
 Left knee x-ray shows a little bit of arthritis

## 2023-07-04 ENCOUNTER — Telehealth: Payer: Self-pay

## 2023-07-04 DIAGNOSIS — K219 Gastro-esophageal reflux disease without esophagitis: Secondary | ICD-10-CM

## 2023-07-04 NOTE — Telephone Encounter (Signed)
 Received fax from [facility] pharmacy requesting pantoprazole 40 mg once daily refill. This is a previous patient of Dr. Russella Dar that was last seen on 02/08/23. Dr. Doy Hutching you are DOD this morning. Please advise if we can refill.

## 2023-07-05 MED ORDER — PANTOPRAZOLE SODIUM 40 MG PO TBEC: DELAYED_RELEASE_TABLET | ORAL | 1 refills | Status: DC

## 2023-07-05 NOTE — Telephone Encounter (Signed)
 Prescription sent to patient's pharmacy.

## 2023-08-21 ENCOUNTER — Telehealth: Payer: Self-pay | Admitting: *Deleted

## 2023-08-21 NOTE — Telephone Encounter (Signed)
 Received a call from Abe Abed at [facility]. Pt had an ultrasound and it's showing enlargements of her ovarian cyst?  Larance Plater to fax over the results and message will be relayed to Dr. Orvil Bland.

## 2023-08-24 ENCOUNTER — Telehealth: Payer: Self-pay | Admitting: *Deleted

## 2023-08-24 ENCOUNTER — Other Ambulatory Visit: Payer: Self-pay

## 2023-08-24 ENCOUNTER — Inpatient Hospital Stay
Admission: RE | Admit: 2023-08-24 | Discharge: 2023-08-24 | Disposition: A | Discharge status: Home / Self Care | Payer: Self-pay | Source: Ambulatory Visit | Attending: Gynecologic Oncology | Admitting: Gynecologic Oncology

## 2023-08-24 DIAGNOSIS — K219 Gastro-esophageal reflux disease without esophagitis: Secondary | ICD-10-CM

## 2023-08-24 DIAGNOSIS — N83209 Unspecified ovarian cyst, unspecified side: Secondary | ICD-10-CM

## 2023-08-24 MED ORDER — PANTOPRAZOLE SODIUM 40 MG PO TBEC: DELAYED_RELEASE_TABLET | ORAL | 0 refills | Status: DC

## 2023-08-24 NOTE — Telephone Encounter (Signed)
 Spoke with [name] and informed patient of her appointment that has been scheduled with Vira Grieves, NP and Dr. Orvil Bland for next Friday, May 30 th at 1 pm with a check in time of 1245. Pt agreed to date and time of appointment and thanked the office for calling back.

## 2023-08-24 NOTE — Telephone Encounter (Signed)
 Spoke with Ms. Hostetter who called the office stating she saw Dr.Tucker last May in regards to her ovarian cyst. Pt states she followed up with Dr.McComb her gyn and had a pelvic ultrasound recently.  Pt states, "I want Dr. Orvil Bland to remove my ovaries"  Advised patient that we spoke with [facility] and they were suppose to fax the results of ultrasound for Dr. Orvil Bland to review, but we haven't received them yet.  Pt states she will call Dr. Neal Baldy office. Pt's message relayed to providers.

## 2023-08-29 ENCOUNTER — Encounter: Payer: Self-pay | Admitting: Gynecologic Oncology

## 2023-08-31 ENCOUNTER — Ambulatory Visit: Admitting: Gynecologic Oncology

## 2023-09-01 ENCOUNTER — Inpatient Hospital Stay

## 2023-09-01 ENCOUNTER — Inpatient Hospital Stay: Attending: Gynecologic Oncology | Admitting: Gynecologic Oncology

## 2023-09-01 VITALS — BP 116/62 | HR 77 | Temp 97.9°F | Resp 16 | Ht 64.0 in | Wt 131.6 lb

## 2023-09-01 DIAGNOSIS — N83209 Unspecified ovarian cyst, unspecified side: Secondary | ICD-10-CM

## 2023-09-01 DIAGNOSIS — R1110 Vomiting, unspecified: Secondary | ICD-10-CM | POA: Diagnosis not present

## 2023-09-01 DIAGNOSIS — R102 Pelvic and perineal pain: Secondary | ICD-10-CM

## 2023-09-01 DIAGNOSIS — N83202 Unspecified ovarian cyst, left side: Secondary | ICD-10-CM | POA: Diagnosis present

## 2023-09-01 DIAGNOSIS — N83201 Unspecified ovarian cyst, right side: Secondary | ICD-10-CM | POA: Diagnosis present

## 2023-09-01 DIAGNOSIS — R110 Nausea: Secondary | ICD-10-CM

## 2023-09-01 LAB — CBC (CANCER CENTER ONLY)
HCT: 38.4 % (ref 36.0–46.0)
Hemoglobin: 12.6 g/dL (ref 12.0–15.0)
MCH: 29.6 pg (ref 26.0–34.0)
MCHC: 32.8 g/dL (ref 30.0–36.0)
MCV: 90.1 fL (ref 80.0–100.0)
Platelet Count: 307 10*3/uL (ref 150–400)
RBC: 4.26 MIL/uL (ref 3.87–5.11)
RDW: 13.6 % (ref 11.5–15.5)
WBC Count: 7.1 10*3/uL (ref 4.0–10.5)
nRBC: 0 % (ref 0.0–0.2)

## 2023-09-01 LAB — COMPREHENSIVE METABOLIC PANEL WITH GFR
ALT: 6 U/L (ref 0–44)
AST: 16 U/L (ref 15–41)
Albumin: 4.6 g/dL (ref 3.5–5.0)
Alkaline Phosphatase: 44 U/L (ref 38–126)
Anion gap: 6 (ref 5–15)
BUN: 16 mg/dL (ref 8–23)
CO2: 27 mmol/L (ref 22–32)
Calcium: 9.6 mg/dL (ref 8.9–10.3)
Chloride: 105 mmol/L (ref 98–111)
Creatinine, Ser: 1.01 mg/dL — ABNORMAL HIGH (ref 0.44–1.00)
GFR, Estimated: 56 mL/min — ABNORMAL LOW (ref >60)
Glucose, Bld: 120 mg/dL — ABNORMAL HIGH (ref 70–99)
Potassium: 4.6 mmol/L (ref 3.5–5.1)
Sodium: 138 mmol/L (ref 135–145)
Total Bilirubin: 0.3 mg/dL (ref 0.0–1.2)
Total Protein: 7.2 g/dL (ref 6.5–8.1)

## 2023-09-01 NOTE — Progress Notes (Unsigned)
 Starting May 14, she was fixing breakfast and started to expereince right hip pain. The pain radiated up her spine. She also developed right pelvic pain. She was having nausea and emesis. Was having loose bowel movements, bleeding with BM yesterday that has resolved. No early satiety or bloating. Denies chest pain, dyspnea. Has been more fatigued. Last weekend, her symptoms eased off. She was dealing with constipation during this time. She used miralax for 3 days with no relief, then used suppository that did not work. Has lost 4 lbs, unintentional but had decreased appetite during the pain period. Selective with dietary choices due to hx diverticulosis. No lower extrem edema.  Normal external genitalia. Vagina without lesions, bleeding, discharge. Cervix without lesions. Bimanual, rectal performed.  -labs today -CT AP Phone visit after CT  GYNECOLOGIC ONCOLOGY FOLLOW UP  Patient Name: [name]  Patient Age: 83 y.o. Date of Service: 09/07/23 Referring Provider: Merryl Abraham, MD  Primary Care Provider: Allene Ivan, MD Gynecologic Oncologist: Natalie Hanger, MD   Assessment/Plan:  Postmenopausal patient with asymptomatic simple appearing adnexal cysts.  I spent some time with the patient discussing her past imaging.  We discussed her MRI back in 2015, CT scans in 2020, and recent ultrasound with her OB/GYN in April of this year.  While I am unable to see the ultrasound pictures, cysts are described as simple in appearance.  Additionally, although it is difficult to compare size measurements across imaging, there has been minimal if any change in size of these adnexal cysts since they were initially noted on MRI in 2015.  Patient also had a recent Ca1 25.  We discussed that this is not a diagnostic marker but can sometimes be elevated in the setting of ovarian cancer.  We discussed that up to 50% of early-stage ovarian cancer can have a normal CA125 and that many noncancerous disease  processes such as diverticulosis can cause its elevation.  Although I cannot see her ultrasound, based on prior imaging within our system and ultrasound report, I do not feel that surgery is indicated.  Due to their simple nature and stable size, the cysts are almost certainly benign.  The patient was very happy to hear this and would like to avoid surgery if at all possible.  Patient asked about need for percutaneous drainage.  We discussed that sometimes this is pursued for symptom management, diagnostic purposes especially in the setting of someone who cannot undergo definitive surgery.  Given her lack of symptoms as well as no urgent need from a diagnostic standpoint, I would not recommend percutaneous drainage.  I think very likely, even if successful, the cyst would likely reaccumulate.  Although I think it is unlikely, we reviewed torsion precautions.  I stressed the importance of calling her OB/GYN or my office if she were to develop acute pain and/or other symptoms such as nausea and vomiting.  In terms of follow-up, I encouraged the patient to follow-up with her OB/GYN.  I do not think that additional imaging is necessary but it would not be unreasonable to get an ultrasound for surveillance next year.  Any change in symptoms should prompt imaging sooner.  A copy of this note was sent to the patient's referring provider.   55 minutes of total time was spent for this patient encounter, including preparation, face-to-face counseling with the patient and coordination of care, and documentation of the encounter.  Natalie Hanger, MD  Division of Gynecologic Oncology  Department of Obstetrics and Gynecology  [facility]  ___________________________________________  Chief Complaint: Chief Complaint  Patient presents with   Cyst of ovary, unspecified laterality    History of Present Illness:  [name] is a 83 y.o. y.o. female who is seen in consultation  at the request of [name] for an evaluation of bilateral adnexal masses.  12/2018: CT A/P Sigmoid diverticulosis, without evidence of diverticulitis. Chronic bilateral ovarian cystic lesions (5.4 x 4.1 cm right ovarian cyst (series 2/image 65), previously 5.0 x 4.0 cm. 3.6 x 3.0 cm left ovarian cyst (series 2/image 62), previously 3.4 x 2.8 cm.) . Annual follow-up ultrasound is suggested in a postmenopausal patient.  Cysts are noted as chronically stable, similar size on MRI 2015.  [facility] shows a uterus measuring 5.5 x 3.7 x 1.9 cm.  Left ovary measures 6.2 x 3.6 x 6.6 cm and has 2 cysts, one measuring up to 5.7 cm and 1 measuring up to 2.7 cm.  Left ovary measures 5.7 x 2.6 x 3.5 cm.  Left ovarian cyst measures up to 3.4 cm and a second cyst measuring up to 2.8 cm.  Impression is that bilateral ovaries contain simple appearing cyst with minimal change in size from last ultrasound.  CA-125 was performed on 4/25 and normal at 14.1.  Patient presents today noting that she feels very good.  She has arthritis and some underlying joint and back pain.  She denies any pelvic symptoms including pressure or pain.  She has a history of diverticular disease with 2 bouts of diverticulitis treated previously.  Notes normal bowel function currently.  Denies any urinary symptoms.  She denies any vaginal bleeding or discharge.  PAST MEDICAL HISTORY:  Past Medical History:  Diagnosis Date   Allergy    Anemia    Arthritis    CAD (coronary artery disease)    Dr [name]   Cancer [facility])    BCC nose   Cataract    both eyes- surgically  removed   Diverticulosis of colon    GERD (gastroesophageal reflux disease)    Herpes zoster    1997 L flank   Hyperlipidemia    intol. to statins in past   Hypertension    Osteopenia    Dr [name], Gynecology   Osteopenia    Scoliosis      PAST SURGICAL HISTORY:  Past Surgical History:  Procedure Laterality Date    CATARACT EXTRACTION  2010   OD  , Dr. Lasandra Fuller   CATARACT EXTRACTION W/ INTRAOCULAR LENS IMPLANT Left 02/2014   Vision not significantly improved   COLONOSCOPY  2007   [name]   CORONARY ANGIOPLASTY WITH STENT PLACEMENT  06/19/2010   2 vessel; Dr Arlester Ladd   LUMBAR DISC SURGERY  11/03/2014   VARICOSE VEIN SURGERY  1971    OB/GYN HISTORY:  OB History  Gravida Para Term Preterm AB Living  5 4      SAB IAB Ectopic Multiple Live Births          # Outcome Date GA Lbr Len/2nd Weight Sex Type Anes PTL Lv  5 Gravida           4 Para           3 Para           2 Para           1 Para             No LMP  recorded. Patient is postmenopausal.  Age at menarche: 67  Age at menopause: 43-51 Hx of HRT: Denies Hx of STDs: Denies Last pap: 06/2021 History of abnormal pap smears: no  SCREENING STUDIES:  Last mammogram: 07/2022  Last colonoscopy: 2019 per chart review.  Patient notes that she has had a colonoscopy within the last 2 years. Last bone mineral density: 2023  MEDICATIONS: Outpatient Encounter Medications as of 09/01/2023  Medication Sig   amLODipine  (NORVASC ) 5 MG tablet TAKE 1 TABLET (5 MG TOTAL) BY MOUTH DAILY.   aspirin 81 MG tablet Take 81 mg by mouth daily.   Black Currant Seed Oil 500 MG CAPS Take 1 capsule by mouth daily.   Calcium  Citrate-Vitamin D  (CITRUS CALCIUM  1500 + D PO) Take 1 capsule by mouth daily.   famotidine  (PEPCID ) 40 MG tablet TAKE 1 TABLET BY MOUTH EVERYDAY AT BEDTIME   Gelatin 600 MG CAPS Take 1 capsule by mouth daily.    Multiple Vitamin (MULTIVITAMIN) capsule Take 1 capsule by mouth daily.   nitroGLYCERIN (NITROSTAT) 0.4 MG SL tablet Place 0.4 mg under the tongue every 5 (five) minutes as needed for chest pain.   ondansetron  (ZOFRAN ) 4 MG tablet Take 1 tablet (4 mg total) by mouth every 8 (eight) hours as needed for nausea or vomiting.   pantoprazole  (PROTONIX ) 40 MG tablet Take one tablet by mouth once daily   PROLIA 60 MG/ML SOSY injection Inject  60 mg into the skin every 6 (six) months.   rosuvastatin  (CRESTOR ) 10 MG tablet TAKE 1 TABLET BY MOUTH EVERY DAY   saccharomyces boulardii (FLORASTOR) 250 MG capsule daily at 6 (six) AM.   spironolactone (ALDACTONE) 25 MG tablet Take 25 mg by mouth daily.   No facility-administered encounter medications on file as of 09/01/2023.    ALLERGIES:  Allergies  Allergen Reactions   Lisinopril      Diffuse itching w/o rash or fever   Metoprolol     Shortness of breath   Hydrocodone  Nausea And Vomiting   Pravastatin Sodium     REACTION: muscle pain in arms 2009   Oxycodone Nausea And Vomiting     FAMILY HISTORY:  Family History  Problem Relation Age of Onset   Osteoarthritis Mother    Skin cancer Mother        ? melanoma   Heart failure Mother    CVA Mother 56   Esophageal cancer Father 59   Heart attack Father 65       smoker   Cancer Maternal Grandmother        ? Fuller   CVA Paternal Grandmother    Diabetes Paternal Grandmother        prediabetic   Deep vein thrombosis Paternal Grandmother    Asthma Son        EIB   Heart attack Maternal Uncle 87   Pancreatic disease Paternal Uncle        unsure if failure or cancer   Stomach cancer Maternal Aunt    Colon cancer Neg Hx    Colon polyps Neg Hx    Rectal cancer Neg Hx    Breast cancer Neg Hx    Endometrial cancer Neg Hx    Ovarian cancer Neg Hx    Prostate cancer Neg Hx      SOCIAL HISTORY:  Social Connections: Socially Integrated (08/24/2022)   Social Connection and Isolation Panel [NHANES]    Frequency of Communication with Friends and Family: More than three times a week  Frequency of Social Gatherings with Friends and Family: Three times a week    Attends Religious Services: 1 to 4 times per year    Active Member of Clubs or Organizations: Yes    Attends Engineer, structural: More than 4 times per year    Marital Status: Married    REVIEW OF SYSTEMS:  + Intermittent ringing in the ears, joint pain,  intermittent back pain. Denies appetite changes, fevers, chills, fatigue, unexplained weight changes. Denies hearing loss, neck lumps or masses, mouth sores or voice changes. Denies cough or wheezing.  Denies shortness of breath. Denies chest pain or palpitations. Denies leg swelling. Denies abdominal distention, pain, blood in stools, constipation, diarrhea, nausea, vomiting, or early satiety. Denies pain with intercourse, dysuria, frequency, hematuria or incontinence. Denies hot flashes, pelvic pain, vaginal bleeding or vaginal discharge.   Denies muscle pain/cramps. Denies itching, rash, or wounds. Denies dizziness, headaches, numbness or seizures. Denies swollen lymph nodes or glands, denies easy bruising or bleeding. Denies anxiety, depression, confusion, or decreased concentration.  Physical Exam:  Vital Signs for this encounter:  Blood pressure 116/62, pulse 77, temperature 97.9 F (36.6 C), temperature source Oral, resp. rate 16, height 5\' 4"  (1.626 m), weight 131 lb 9.6 oz (59.7 kg), SpO2 99%. Body mass index is 22.59 kg/m. General: Alert, oriented, no acute distress.  HEENT: Normocephalic, atraumatic. Sclera anicteric.  Chest: Clear to auscultation bilaterally. No wheezes, rhonchi, or rales. Cardiovascular: Regular rate and rhythm, no murmurs, rubs, or gallops.  Abdomen: Normoactive bowel sounds. Soft, nondistended, nontender to palpation. No masses or hepatosplenomegaly appreciated. No palpable fluid wave.  Extremities: Grossly normal range of motion. Warm, well perfused. No edema bilaterally.  Skin: No rashes or lesions.  Lymphatics: No cervical, supraclavicular, or inguinal adenopathy.  GU:  Normal external female genitalia. No lesions. No discharge or bleeding.             Bladder/urethra:  No lesions or masses, well supported bladder             Vagina: Moderately atrophic, no lesions noted.             Cervix: Normal appearing, no lesions.             Uterus:  Small,  mobile, no parametrial involvement or nodularity.             Adnexa: 5-6 cm smooth and mobile mass within the cul-de-sac to the right, left adnexa not able to be palpated on vaginal exam.    Rectal: Deferred.  LABORATORY AND RADIOLOGIC DATA:  Outside medical records were reviewed to synthesize the above history, along with the history and physical obtained during the visit.

## 2023-09-01 NOTE — Patient Instructions (Addendum)
 We will plan on checking a CT scan of the abdomen and pelvis to further evaluate the pain.  -Nothing to eat or drink 4 hours before the scan. Arrive two hours before to drink oral contrast.   We will plan on checking labs today given the fatigue you are having and the recent symptoms. We will contact you with the results.   We will arrange for a telephone visit after the scan to discuss results and recommendations moving forward.

## 2023-09-01 NOTE — H&P (View-Only) (Signed)
 GYNECOLOGIC ONCOLOGY FOLLOW UP  Patient Name: [name]  Patient Age: 83 y.o. Date of Service: 09/01/2023 Referring Provider: Merryl Abraham, MD  Primary Care Provider: Allene Ivan, MD Gynecologic Oncologist: Wiley Hanger, MD   Assessment/Plan:  Postmenopausal patient now symptomatic with simple appearing adnexal cysts on recent ultrasound with slight increase in size. Recent episode of pain with emesis could be related to possible torsion. Given reported symptoms, plan for CT scan to further evaluate the abdomen/pelvis. She will have a phone visit with Dr. Orvil Bland after to discuss the scan results and next steps. Given symptoms including increased fatigue, weight loss, will check CBC, Cmet today as well. She will be contacted with the results. Reportable signs and symptoms reviewed. Dr. Orvil Bland discussed plan with patient as well.  A copy of this note was sent to the patient's referring provider.   30 minutes of total time was spent for this patient encounter, including preparation, face-to-face counseling with the patient and coordination of care, and documentation of the encounter.  Vira Grieves, NP [facility] Gynecologic Oncology ___________________________________________  Chief Complaint: Chief Complaint  Patient presents with   Cyst of ovary, unspecified laterality    History of Present Illness:  [name] is a 83 y.o. female who was initially seen in consultation at the request of Dr. McComb for an evaluation of bilateral adnexal masses.  12/2018: CT A/P Sigmoid diverticulosis, without evidence of diverticulitis. Chronic bilateral ovarian cystic lesions (5.4 x 4.1 cm right ovarian cyst (series 2/image 65), previously 5.0 x 4.0 cm. 3.6 x 3.0 cm left ovarian cyst (series 2/image 62, previously 3.4 x 2.8 cm.) . Annual follow-up ultrasound is suggested in a postmenopausal patient.  Cysts are noted as chronically stable, similar size on MRI 2015.  [facility] shows a uterus measuring 5.5 x 3.7 x 1.9 cm.  Left ovary measures 6.2 x 3.6 x 6.6 cm and has 2 cysts, one measuring up to 5.7 cm and 1 measuring up to 2.7 cm.  Left ovary measures 5.7 x 2.6 x 3.5 cm.  Left ovarian cyst measures up to 3.4 cm and a second cyst measuring up to 2.8 cm.  Impression is that bilateral ovaries contain simple appearing cyst with minimal change in size from last ultrasound.  CA-125 was performed on 07/28/2022 and normal at 14.1.  At [facility] on 08/17/2023, she underwent a repeat ultrasound and CA 125. CA 125 returned at 18.5. Ultrasound resulted: Uterus WNL. Rt ov with simple appearing cysts=5.9 x 4.7 cm (previously = 5.7 x 3.5 cm), 3.1 x 1.9 cm (previously = 2.7 x 1.3 cm) Lt ov with simple appearing cysts = 3.7 x 2.9 cm (previously = 3.4 x 2.8 cm), 2.7 x 1.9 cm (previously = 2.7 x 1.6 cm).  No adnexal masses seen. Small amount of FF in posterior CDS.  Interval: On Aug 16, 2023, she was fixing breakfast and started to experience right hip pain. The pain radiated up her spine. She also developed right pelvic pain. She was having nausea and emesis. Was having loose bowel movements as well. Bleeding with BM yesterday that has resolved and felt to be related to irritation. No early satiety or bloating. Denies chest pain, dyspnea. Has been more fatigued with no energy. Last weekend, her symptoms eased off. Last emesis reported 1 week ago on Friday. She was dealing with constipation during this time. She used miralax for 3 days with no relief, then used a  suppository that did not work. She has lost 4 lbs, unintentional but had decreased appetite during the period of severe pain and emesis. She is trying to be selective with dietary choices due to hx of diverticulosis. No lower extrem edema. No vaginal bleeding or discharge reported. Denies urinary symptoms.  PAST MEDICAL HISTORY:  Past Medical History:  Diagnosis Date   Allergy     Anemia    Arthritis    CAD (coronary artery disease)    Dr Mitzie Anda   Cancer [facility])    BCC nose   Cataract    both eyes- surgically  removed   Diverticulosis of colon    GERD (gastroesophageal reflux disease)    Herpes zoster    1997 L flank   Hyperlipidemia    intol. to statins in past   Hypertension    Osteopenia    Dr Cloretta Danes, Gynecology   Osteopenia    Scoliosis      PAST SURGICAL HISTORY:  Past Surgical History:  Procedure Laterality Date   CATARACT EXTRACTION  2010   OD  , Dr. Lasandra Points   CATARACT EXTRACTION W/ INTRAOCULAR LENS IMPLANT Left 02/2014   Vision not significantly improved   COLONOSCOPY  2007   [facility]   CORONARY ANGIOPLASTY WITH STENT PLACEMENT  06/19/2010   2 vessel; Dr Arlester Ladd   LUMBAR DISC SURGERY  11/03/2014   VARICOSE VEIN SURGERY  1971    OB/GYN HISTORY:  OB History  Gravida Para Term Preterm AB Living  5 4      SAB IAB Ectopic Multiple Live Births          # Outcome Date GA Lbr Len/2nd Weight Sex Type Anes PTL Lv  5 Gravida           4 Para           3 Para           2 Para           1 Para             No LMP recorded. Patient is postmenopausal.  Age at menarche: 48  Age at menopause: 46-52 Hx of HRT: Denies Hx of STDs: Denies Last pap: 06/2021 History of abnormal pap smears: no  SCREENING STUDIES:  Last mammogram: 07/2022  Last colonoscopy: 2019 per chart review.   Last bone mineral density: 2023  MEDICATIONS: Outpatient Encounter Medications as of 09/01/2023  Medication Sig   amLODipine  (NORVASC ) 5 MG tablet TAKE 1 TABLET (5 MG TOTAL) BY MOUTH DAILY.   aspirin 81 MG tablet Take 81 mg by mouth daily.   Black Currant Seed Oil 500 MG CAPS Take 1 capsule by mouth daily.   Calcium  Citrate-Vitamin D  (CITRUS CALCIUM  1500 + D PO) Take 1 capsule by mouth daily.   famotidine  (PEPCID ) 40 MG tablet TAKE 1 TABLET BY MOUTH EVERYDAY AT BEDTIME   Gelatin 600 MG CAPS Take 1 capsule by mouth daily.    Multiple Vitamin (MULTIVITAMIN) capsule  Take 1 capsule by mouth daily.   nitroGLYCERIN (NITROSTAT) 0.4 MG SL tablet Place 0.4 mg under the tongue every 5 (five) minutes as needed for chest pain.   ondansetron  (ZOFRAN ) 4 MG tablet Take 1 tablet (4 mg total) by mouth every 8 (eight) hours as needed for nausea or vomiting.   pantoprazole  (PROTONIX ) 40 MG tablet Take one tablet by mouth once daily   PROLIA 60 MG/ML SOSY injection Inject 60 mg into the skin every 6 (six) months.  rosuvastatin  (CRESTOR ) 10 MG tablet TAKE 1 TABLET BY MOUTH EVERY DAY   saccharomyces boulardii (FLORASTOR) 250 MG capsule daily at 6 (six) AM.   spironolactone (ALDACTONE) 25 MG tablet Take 25 mg by mouth daily.   No facility-administered encounter medications on file as of 09/01/2023.    ALLERGIES:  Allergies  Allergen Reactions   Lisinopril      Diffuse itching w/o rash or fever   Metoprolol     Shortness of breath   Hydrocodone  Nausea And Vomiting   Pravastatin Sodium     REACTION: muscle pain in arms 2009   Oxycodone Nausea And Vomiting     FAMILY HISTORY:  Family History  Problem Relation Age of Onset   Osteoarthritis Mother    Skin cancer Mother        ? melanoma   Heart failure Mother    CVA Mother 68   Esophageal cancer Father 58   Heart attack Father 9       smoker   Cancer Maternal Grandmother        ? GI   CVA Paternal Grandmother    Diabetes Paternal Grandmother        prediabetic   Deep vein thrombosis Paternal Grandmother    Asthma Son        EIB   Heart attack Maternal Uncle 58   Pancreatic disease Paternal Uncle        unsure if failure or cancer   Stomach cancer Maternal Aunt    Colon cancer Neg Hx    Colon polyps Neg Hx    Rectal cancer Neg Hx    Breast cancer Neg Hx    Endometrial cancer Neg Hx    Ovarian cancer Neg Hx    Prostate cancer Neg Hx      SOCIAL HISTORY:  Social Connections: Socially Integrated (08/24/2022)   Social Connection and Isolation Panel [NHANES]    Frequency of Communication with Friends  and Family: More than three times a week    Frequency of Social Gatherings with Friends and Family: Three times a week    Attends Religious Services: 1 to 4 times per year    Active Member of Clubs or Organizations: Yes    Attends Engineer, structural: More than 4 times per year    Marital Status: Married    REVIEW OF SYSTEMS:  See interval. Additional review negative.  Physical Exam:  Vital Signs for this encounter:  Blood pressure 116/62, pulse 77, temperature 97.9 F (36.6 C), temperature source Oral, resp. rate 16, height 5' 4 (1.626 m), weight 131 lb 9.6 oz (59.7 kg), SpO2 99%. Body mass index is 22.59 kg/m. General: Alert, oriented, no acute distress.  HEENT: Normocephalic, atraumatic. Sclera anicteric.  Chest: Clear to auscultation bilaterally. No wheezes, rhonchi, or rales. Cardiovascular: Regular rate and rhythm, no murmurs, rubs, or gallops.  Abdomen: Normoactive bowel sounds. Soft, nondistended, nontender to palpation. No masses or hepatosplenomegaly appreciated. No palpable fluid wave.  Extremities: Grossly normal range of motion. Warm, well perfused. No edema bilaterally.  Skin: No rashes or lesions.  Lymphatics: No cervical, supraclavicular, or inguinal adenopathy.  GU:  Normal external female genitalia. No lesions. No discharge or bleeding.             Bladder/urethra:  No lesions or masses, well supported bladder             Vagina: Moderately atrophic, no lesions noted.  Cervix: Normal appearing, no lesions.             Uterus:  Small, mobile, no parametrial involvement or nodularity.             Adnexa: 5-6 cm smooth and mobile mass within the cul-de-sac to the right, left adnexa not able to be palpated on vaginal exam.    Rectal: Good tone, no nodularity.  LABORATORY AND RADIOLOGIC DATA:  Outside medical records were reviewed to synthesize the above history, along with the history and physical obtained during the visit.

## 2023-09-02 ENCOUNTER — Ambulatory Visit (HOSPITAL_BASED_OUTPATIENT_CLINIC_OR_DEPARTMENT_OTHER)
Admission: RE | Admit: 2023-09-02 | Discharge: 2023-09-02 | Disposition: A | Discharge status: Home / Self Care | Source: Ambulatory Visit | Attending: Gynecologic Oncology | Admitting: Gynecologic Oncology

## 2023-09-02 DIAGNOSIS — R102 Pelvic and perineal pain: Secondary | ICD-10-CM | POA: Insufficient documentation

## 2023-09-02 DIAGNOSIS — N83209 Unspecified ovarian cyst, unspecified side: Secondary | ICD-10-CM | POA: Insufficient documentation

## 2023-09-02 MED ORDER — IOHEXOL 300 MG/ML  SOLN
80.0000 mL | Freq: Once | INTRAMUSCULAR | Status: AC | PRN
Start: 2023-09-02 — End: 2023-09-02
  Administered 2023-09-02: 80 mL via INTRAVENOUS

## 2023-09-04 ENCOUNTER — Telehealth: Payer: Self-pay | Admitting: *Deleted

## 2023-09-04 NOTE — Telephone Encounter (Signed)
 Spoke with patient and relayed message from Vira Grieves, NP -Pt's CT scan results are back. They see mild left kidney scarring that is unchanged. Diverticulosis seen wit no evidence of diverticulitis. They note the ovarian cysts have increased in size. The one of the right is now measuring 7.7 cm. The left cyst is measuring 5.3 cm. NO ascites or abnormal fluid seen. These results will be routed to Dr. Orvil Bland for next steps. Pt's phone appt. Was moved to this Wednesday, June 4 th at 6 pm. Pt verbalized understanding and thanked the office for calling.

## 2023-09-04 NOTE — Telephone Encounter (Signed)
 Spoke with Ms. [name] and relayed message from Natalie Grieves, NP that patient's CBC looking at [facility] Count and blood count is normal. On metabolic Panel pt's kidney function was slightly elevated at 1.10. Advised patient to stay well hydrated and results faxed to PCP Dr. Kurt Phi Fax #[phone].

## 2023-09-04 NOTE — Telephone Encounter (Signed)
-----   Message from Suellyn Emory sent at 09/04/2023  9:19 AM EDT ----- Please call her with her CT scan results:  1) they see mild left kidney scarring that is unchanged.  2) Diverticulosis seen with no evidence of diverticulitis.   3) They note the ovarian cysts have increased in size. The one of the right is now measuring 7.7 cm. The left cyst is measuring 5.3 cm. No ascites or abnormal fluid seen.  THE RESULTS WILL BE ROUTED TO DR. TUCKER FOR NEXT STEPS. WE WILL PLAN ON MOVING HER PHONE VISIT UP TO THIS WEEK.

## 2023-09-04 NOTE — Telephone Encounter (Signed)
-----   Message from Suellyn Emory sent at 09/01/2023  4:33 PM EDT ----- Please reach out and let her know her CBC looking at white count and blood count is normal. On her metabolic panel, her kidney function was slightly elevated at 1.01. Would advise her to stay hydrated and please fax results of both to her PCP.

## 2023-09-06 ENCOUNTER — Inpatient Hospital Stay: Attending: Gynecologic Oncology | Admitting: Gynecologic Oncology

## 2023-09-06 ENCOUNTER — Ambulatory Visit: Payer: Self-pay | Admitting: Gynecologic Oncology

## 2023-09-06 DIAGNOSIS — N83201 Unspecified ovarian cyst, right side: Secondary | ICD-10-CM

## 2023-09-06 DIAGNOSIS — R102 Pelvic and perineal pain: Secondary | ICD-10-CM

## 2023-09-06 DIAGNOSIS — N83202 Unspecified ovarian cyst, left side: Secondary | ICD-10-CM

## 2023-09-06 DIAGNOSIS — N83209 Unspecified ovarian cyst, unspecified side: Secondary | ICD-10-CM

## 2023-09-06 NOTE — Progress Notes (Unsigned)
 Called patient at 4 pm. LVM letting her know I will call again tonight after surgery or tomorrow.

## 2023-09-07 ENCOUNTER — Encounter: Payer: Self-pay | Admitting: Gynecologic Oncology

## 2023-09-07 NOTE — Progress Notes (Signed)
 Was able to reach patient on 6/5. Discussed CT findings, enlarging bilateral masses. No evidence of metastatic disease. Based on ultrasound appearance of cysts, I strongly favor these are benign, but in the setting of recent symptoms, I think very reasonable to proceed with diagnostic and therapeutic surgery. Discussed plan for robotic BSO vs TLH/BSO. After reviewing risks and benefits, she is amenable to BSO only unless more is dictated by operative findings or pathology.  Wiley Hanger MD Gynecologic Oncology

## 2023-09-08 ENCOUNTER — Telehealth: Payer: Self-pay | Admitting: *Deleted

## 2023-09-08 DIAGNOSIS — N83201 Unspecified ovarian cyst, right side: Secondary | ICD-10-CM | POA: Insufficient documentation

## 2023-09-08 NOTE — Telephone Encounter (Signed)
 Spoke with [name] and relayed message from Melissa Cross, NP about dates that are open for surgery. Pt states she would like Wednesday, June 11 th. A pre-op appt. Has been scheduled for Tuesday, June 10 th with Vira Grieves, NP.

## 2023-09-08 NOTE — Telephone Encounter (Signed)
-----   Message from Suellyn Emory sent at 09/08/2023  9:47 AM EDT ----- Please reach out to patient to discuss surgery dates and see what date works for her:  We have an opening on Wed next week June 11, also one on June 19, July 2, July 3.  THank you  PLEASE SET HER UP FOR A PREOP WITH KAREN OR MYSELF ONCE SHE HAS SELECTED THE DATE

## 2023-09-11 ENCOUNTER — Encounter: Payer: Self-pay | Admitting: Family

## 2023-09-11 NOTE — Patient Instructions (Signed)
 SURGICAL WAITING ROOM VISITATION  Patients having surgery or a procedure may have no more than 2 support people in the waiting area - these visitors may rotate.    Children under the age of 11 must have an adult with them who is not the patient.  Visitors with respiratory illnesses are discouraged from visiting and should remain at home.  If the patient needs to stay at the hospital during part of their recovery, the visitor guidelines for inpatient rooms apply. Pre-op nurse will coordinate an appropriate time for 1 support person to accompany patient in pre-op.  This support person may not rotate.    Please refer to the [facility] website for the visitor guidelines for Inpatients (after your surgery is over and you are in a regular room).       Your procedure is scheduled on: 09/13/23   Report to [facility] Main Entrance    Report to admitting at : 11:00 AM   Call this number if you have problems the morning of surgery [phone]   Do not eat food :After Midnight.   After Midnight you may have the following liquids until : 10:00 AM DAY OF SURGERY  Water Non-Citrus Juices (without pulp, NO RED-Apple, White grape, White cranberry) Black Coffee (NO MILK/CREAM OR CREAMERS, sugar ok)  Clear Tea (NO MILK/CREAM OR CREAMERS, sugar ok) regular and decaf                             Plain Jell-O (NO RED)                                           Fruit ices (not with fruit pulp, NO RED)                                     Popsicles (NO RED)                                                               Sports drinks like Gatorade (NO RED)              FOLLOW ANY ADDITIONAL PRE OP INSTRUCTIONS YOU RECEIVED FROM YOUR SURGEON'S OFFICE!!!   Oral Hygiene is also important to reduce your risk of infection.                                    Remember - BRUSH YOUR TEETH THE MORNING OF SURGERY WITH YOUR REGULAR TOOTHPASTE  DENTURES WILL BE REMOVED PRIOR TO SURGERY PLEASE DO NOT APPLY  "Poly grip" OR ADHESIVES!!!   Do NOT smoke after Midnight   Stop all vitamins and herbal supplements 7 days before surgery.   Take these medicines the morning of surgery with A SIP OF WATER: amlodipine ,pantoprazole .                              You may not have any metal on your body  including hair pins, jewelry, and body piercing             Do not wear make-up, lotions, powders, perfumes/cologne, or deodorant  Do not wear nail polish including gel and S&S, artificial/acrylic nails, or any other type of covering on natural nails including finger and toenails. If you have artificial nails, gel coating, etc. that needs to be removed by a nail salon please have this removed prior to surgery or surgery may need to be canceled/ delayed if the surgeon/ anesthesia feels like they are unable to be safely monitored.   Do not shave  48 hours prior to surgery.    Do not bring valuables to the hospital. [place] IS NOT             RESPONSIBLE   FOR VALUABLES.   Contacts, glasses, dentures or bridgework may not be worn into surgery.   Bring small overnight bag day of surgery.   DO NOT BRING YOUR HOME MEDICATIONS TO THE HOSPITAL. PHARMACY WILL DISPENSE MEDICATIONS LISTED ON YOUR MEDICATION LIST TO YOU DURING YOUR ADMISSION IN THE HOSPITAL!    Patients discharged on the day of surgery will not be allowed to drive home.  Someone NEEDS to stay with you for the first 24 hours after anesthesia.   Special Instructions: Bring a copy of your healthcare power of attorney and living will documents the day of surgery if you haven't scanned them before.              Please read over the following fact sheets you were given: IF YOU HAVE QUESTIONS ABOUT YOUR PRE-OP INSTRUCTIONS PLEASE CALL [phone]   If you received a COVID test during your pre-op visit  it is requested that you wear a mask when out in public, stay away from anyone that may not be feeling well and notify your surgeon if you develop  symptoms. If you test positive for Covid or have been in contact with anyone that has tested positive in the last 10 days please notify you surgeon.    [place] - Preparing for Surgery Before surgery, you can play an important role.  Because skin is not sterile, your skin needs to be as free of germs as possible.  You can reduce the number of germs on your skin by washing with CHG (chlorahexidine gluconate) soap before surgery.  CHG is an antiseptic cleaner which kills germs and bonds with the skin to continue killing germs even after washing. Please DO NOT use if you have an allergy to CHG or antibacterial soaps.  If your skin becomes reddened/irritated stop using the CHG and inform your nurse when you arrive at Short Stay. Do not shave (including legs and underarms) for at least 48 hours prior to the first CHG shower.  You may shave your face/neck. Please follow these instructions carefully:  1.  Shower with CHG Soap the night before surgery and the  morning of Surgery.  2.  If you choose to wash your hair, wash your hair first as usual with your  normal  shampoo.  3.  After you shampoo, rinse your hair and body thoroughly to remove the  shampoo.                           4.  Use CHG as you would any other liquid soap.  You can apply chg directly  to the skin and wash  Gently with a scrungie or clean washcloth.  5.  Apply the CHG Soap to your body ONLY FROM THE NECK DOWN.   Do not use on face/ open                           Wound or open sores. Avoid contact with eyes, ears mouth and genitals (private parts).                       Wash face,  Genitals (private parts) with your normal soap.             6.  Wash thoroughly, paying special attention to the area where your surgery  will be performed.  7.  Thoroughly rinse your body with warm water from the neck down.  8.  DO NOT shower/wash with your normal soap after using and rinsing off  the CHG Soap.                9.  Pat  yourself dry with a clean towel.            10.  Wear clean pajamas.            11.  Place clean sheets on your bed the night of your first shower and do not  sleep with pets. Day of Surgery : Do not apply any lotions/deodorants the morning of surgery.  Please wear clean clothes to the hospital/surgery center.  FAILURE TO FOLLOW THESE INSTRUCTIONS MAY RESULT IN THE CANCELLATION OF YOUR SURGERY PATIENT SIGNATURE_________________________________  NURSE SIGNATURE__________________________________  ________________________________________________________________________ WHAT IS A BLOOD TRANSFUSION? Blood Transfusion Information  A transfusion is the replacement of blood or some of its parts. Blood is made up of multiple cells which provide different functions. Red blood cells carry oxygen and are used for blood loss replacement. White blood cells fight against infection. Platelets control bleeding. Plasma helps clot blood. Other blood products are available for specialized needs, such as hemophilia or other clotting disorders. BEFORE THE TRANSFUSION  Who gives blood for transfusions?  Healthy volunteers who are fully evaluated to make sure their blood is safe. This is blood bank blood. Transfusion therapy is the safest it has ever been in the practice of medicine. Before blood is taken from a donor, a complete history is taken to make sure that person has no history of diseases nor engages in risky social behavior (examples are intravenous drug use or sexual activity with multiple partners). The donor's travel history is screened to minimize risk of transmitting infections, such as malaria. The donated blood is tested for signs of infectious diseases, such as HIV and hepatitis. The blood is then tested to be sure it is compatible with you in order to minimize the chance of a transfusion reaction. If you or a relative donates blood, this is often done in anticipation of surgery and is not  appropriate for emergency situations. It takes many days to process the donated blood. RISKS AND COMPLICATIONS Although transfusion therapy is very safe and saves many lives, the main dangers of transfusion include:  Getting an infectious disease. Developing a transfusion reaction. This is an allergic reaction to something in the blood you were given. Every precaution is taken to prevent this. The decision to have a blood transfusion has been considered carefully by your caregiver before blood is given. Blood is not given unless the benefits outweigh the risks. AFTER THE TRANSFUSION Right after receiving  a blood transfusion, you will usually feel much better and more energetic. This is especially true if your red blood cells have gotten low (anemic). The transfusion raises the level of the red blood cells which carry oxygen, and this usually causes an energy increase. The nurse administering the transfusion will monitor you carefully for complications. HOME CARE INSTRUCTIONS  No special instructions are needed after a transfusion. You may find your energy is better. Speak with your caregiver about any limitations on activity for underlying diseases you may have. SEEK MEDICAL CARE IF:  Your condition is not improving after your transfusion. You develop redness or irritation at the intravenous (IV) site. SEEK IMMEDIATE MEDICAL CARE IF:  Any of the following symptoms occur over the next 12 hours: Shaking chills. You have a temperature by mouth above 102 F (38.9 C), not controlled by medicine. Chest, back, or muscle pain. People around you feel you are not acting correctly or are confused. Shortness of breath or difficulty breathing. Dizziness and fainting. You get a rash or develop hives. You have a decrease in urine output. Your urine turns a dark color or changes to pink, red, or brown. Any of the following symptoms occur over the next 10 days: You have a temperature by mouth above 102 F  (38.9 C), not controlled by medicine. Shortness of breath. Weakness after normal activity. The white part of the eye turns yellow (jaundice). You have a decrease in the amount of urine or are urinating less often. Your urine turns a dark color or changes to pink, red, or brown. Document Released: 03/18/2000 Document Revised: 06/13/2011 Document Reviewed: 11/05/2007 [facility] Patient Information 2014 [place], [place].  _______________________________________________________________________

## 2023-09-11 NOTE — Patient Instructions (Signed)
 Preparing for your Surgery  Plan for surgery on 09/13/2023 with Dr. Wiley Hanger at [facility]. You will be scheduled for robotic assisted laparoscopic bilateral salpingo-oophorectomy (removal of both ovaries and fallopian tubes), possible staging if a precancer or cancer is found.   Pre-operative Testing -(Done) You will receive a phone call from presurgical testing at [facility] to arrange for a pre-operative appointment and lab work.  -Bring your insurance card, copy of an advanced directive if applicable, medication list  -At that visit, you will be asked to sign a consent for a possible blood transfusion in case a transfusion becomes necessary during surgery.  The need for a blood transfusion is rare but having consent is a necessary part of your care.     -Do not take aspirin the morning of surgery.  -Do not take supplements such as fish oil (omega 3), red yeast rice, turmeric before your surgery. STOP TAKING AT LEAST 10 DAYS BEFORE SURGERY. You want to avoid medications with aspirin in them including headache powders such as BC or Goody's), Excedrin migraine.  Day Before Surgery at Home -You will be asked to take in a light diet the day before surgery. You will be advised you can have clear liquids up until 3 hours before your surgery.    Eat a light diet the day before surgery.  Examples including soups, broths, toast, yogurt, mashed potatoes.  AVOID GAS PRODUCING FOODS AND BEVERAGES. Things to avoid include carbonated beverages (fizzy beverages, sodas), raw fruits and raw vegetables (uncooked), or beans.   If your bowels are filled with gas, your surgeon will have difficulty visualizing your pelvic organs which increases your surgical risks.  Your role in recovery Your role is to become active as soon as directed by your doctor, while still giving yourself time to heal.  Rest when you feel tired. You will be asked to do the following in order to speed your  recovery:  - Cough and breathe deeply. This helps to clear and expand your lungs and can prevent pneumonia after surgery.  - STAY ACTIVE WHEN YOU GET HOME. Do mild physical activity. Walking or moving your legs help your circulation and body functions return to normal. Do not try to get up or walk alone the first time after surgery.   -If you develop swelling on one leg or the other, pain in the back of your leg, redness/warmth in one of your legs, please call the office or go to the Emergency Room to have a doppler to rule out a blood clot. For shortness of breath, chest pain-seek care in the Emergency Room as soon as possible. - Actively manage your pain. Managing your pain lets you move in comfort. We will ask you to rate your pain on a scale of zero to 10. It is your responsibility to tell your doctor or nurse where and how much you hurt so your pain can be treated.  Special Considerations -If you are diabetic, you may be placed on insulin after surgery to have closer control over your blood sugars to promote healing and recovery.  This does not mean that you will be discharged on insulin.  If applicable, your oral antidiabetics will be resumed when you are tolerating a solid diet.  -Your final pathology results from surgery should be available around one week after surgery and the results will be relayed to you when available.  -FMLA forms can be faxed to [phone] and please allow 5-7 business days  for completion.  Pain Management After Surgery -You will be prescribed your pain medication and bowel regimen medications before surgery so that you can have these available when you are discharged from the hospital. The pain medication is for use ONLY AFTER surgery and a new prescription will not be given.   -Make sure that you have Tylenol  and Ibuprofen ( or ALEVE, do not take together since they work similarly) IF YOU ARE ABLE TO TAKE THESE MEDICATIONS at home to use on a regular basis after  surgery for pain control. We recommend alternating the medications every hour to six hours since they work differently and are processed in the body differently for pain relief.  -Review the attached handout on narcotic use and their risks and side effects.   Bowel Regimen -You will be prescribed Sennakot-S to take nightly to prevent constipation especially if you are taking the narcotic pain medication intermittently.  It is important to prevent constipation and drink adequate amounts of liquids. You can stop taking this medication when you are not taking pain medication and you are back on your normal bowel routine.  Risks of Surgery Risks of surgery are low but include bleeding, infection, damage to surrounding structures, re-operation, blood clots, and very rarely death.   Blood Transfusion Information (For the consent to be signed before surgery)  We will be checking your blood type before surgery so in case of emergencies, we will know what type of blood you would need.                                            WHAT IS A BLOOD TRANSFUSION?  A transfusion is the replacement of blood or some of its parts. Blood is made up of multiple cells which provide different functions. Red blood cells carry oxygen and are used for blood loss replacement. White blood cells fight against infection. Platelets control bleeding. Plasma helps clot blood. Other blood products are available for specialized needs, such as hemophilia or other clotting disorders. BEFORE THE TRANSFUSION  Who gives blood for transfusions?  You may be able to donate blood to be used at a later date on yourself (autologous donation). Relatives can be asked to donate blood. This is generally not any safer than if you have received blood from a stranger. The same precautions are taken to ensure safety when a relative's blood is donated. Healthy volunteers who are fully evaluated to make sure their blood is safe. This is blood  bank blood. Transfusion therapy is the safest it has ever been in the practice of medicine. Before blood is taken from a donor, a complete history is taken to make sure that person has no history of diseases nor engages in risky social behavior (examples are intravenous drug use or sexual activity with multiple partners). The donor's travel history is screened to minimize risk of transmitting infections, such as malaria. The donated blood is tested for signs of infectious diseases, such as HIV and hepatitis. The blood is then tested to be sure it is compatible with you in order to minimize the chance of a transfusion reaction. If you or a relative donates blood, this is often done in anticipation of surgery and is not appropriate for emergency situations. It takes many days to process the donated blood. RISKS AND COMPLICATIONS Although transfusion therapy is very safe and saves many lives, the  main dangers of transfusion include:  Getting an infectious disease. Developing a transfusion reaction. This is an allergic reaction to something in the blood you were given. Every precaution is taken to prevent this. The decision to have a blood transfusion has been considered carefully by your caregiver before blood is given. Blood is not given unless the benefits outweigh the risks.  AFTER SURGERY INSTRUCTIONS  Return to work: 4-6 weeks if applicable  Activity: 1. Be up and out of the bed during the day.  Take a nap if needed.  You may walk up steps but be careful and use the hand rail.  Stair climbing will tire you more than you think, you may need to stop part way and rest.   2. No lifting or straining for 6 weeks over 10 pounds. No pushing, pulling, straining for 6 weeks.  3. No driving for 6-57 days when the following criteria have been met: Do not drive if you are taking narcotic pain medicine and make sure that your reaction time has returned.   4. You can shower as soon as the next day after  surgery. Shower daily.  Use your regular soap and water (not directly on the incision) and pat your incision(s) dry afterwards; don't rub.  No tub baths or submerging your body in water until cleared by your surgeon. If you have the soap that was given to you by pre-surgical testing that was used before surgery, you do not need to use it afterwards because this can irritate your incisions.   5. No sexual activity and nothing in the vagina for 6 weeks, 12 weeks if you have a hysterectomy.  6. You may experience a small amount of clear drainage from your incisions, which is normal.  If the drainage persists, increases, or changes color please call the office.  7. Do not use creams, lotions, or ointments such as neosporin on your incisions after surgery until advised by your surgeon because they can cause removal of the dermabond glue on your incisions.    8. You may experience vaginal spotting after surgery or when the stitches at the top of the vagina begin to dissolve (if you have a hysterectomy).  The spotting is normal but if you experience heavy bleeding, call our office.  9. Take Tylenol  or ibuprofen (OR ALEVE) first for pain if you are able to take these medications and only use narcotic pain medication for severe pain not relieved by the Tylenol  or Ibuprofen.  Monitor your Tylenol  intake to a max of 4,000 mg in a 24 hour period. You can alternate these medications after surgery.  Diet: 1. Low sodium Heart Healthy Diet is recommended but you are cleared to resume your normal (before surgery) diet after your procedure.  2. It is safe to use a laxative, such as Miralax or Colace, if you have difficulty moving your bowels before surgery. You have been prescribed Sennakot-S to take at bedtime every evening after surgery to keep bowel movements regular and to prevent constipation.    Wound Care: 1. Keep clean and dry.  Shower daily.  Reasons to call the Doctor: Fever - Oral temperature greater  than 100.4 degrees Fahrenheit Foul-smelling vaginal discharge Difficulty urinating Nausea and vomiting Increased pain at the site of the incision that is unrelieved with pain medicine. Difficulty breathing with or without chest pain New calf pain especially if only on one side Sudden, continuing increased vaginal bleeding with or without clots.   Contacts: For questions or  concerns you should contact:  Dr. Wiley Hanger at [phone]  Vira Grieves, NP at [phone]  After Hours: call [phone] and have the GYN Oncologist paged/contacted (after 5 pm or on the weekends). You will speak with an after hours RN and let he or she know you have had surgery.  Messages sent via mychart are for non-urgent matters and are not responded to after hours so for urgent needs, please call the after hours number.

## 2023-09-11 NOTE — Progress Notes (Unsigned)
 Patient here for a pre-operative appointment prior to her scheduled surgery on 09/13/2023. She is scheduled for robotic assisted BSO, possible staging.  She had her pre-admission testing appointment this am at [facility].  The surgery was discussed in detail.  See after visit summary for additional details.    Discussed post-op pain management in detail including the aspects of the enhanced recovery pathway.  Advised her that a new prescription would be sent in for tramadol  and it is only to be used for after her upcoming surgery.  We discussed the use of tylenol  post-op and to monitor for a maximum of 4,000 mg in a 24 hour period.  Also prescribed sennakot to be used after surgery and to hold if having loose stools.  Discussed bowel regimen in detail.     Discussed measures to take at home to prevent DVT including frequent mobility.  Reportable signs and symptoms of DVT discussed. Post-operative instructions discussed and expectations for after surgery. Incisional care discussed as well including reportable signs and symptoms including erythema, drainage, wound separation.     10 minutes spent preparing information and with the patient.  Verbalizing understanding of material discussed. No needs or concerns voiced at the end of the visit.   Advised patient to call for any needs.  Advised that her post-operative medications had been prescribed and could be picked up at any time.    This appointment is included in the global surgical bundle as pre-operative teaching and has no charge.

## 2023-09-12 ENCOUNTER — Telehealth: Payer: Self-pay | Admitting: *Deleted

## 2023-09-12 ENCOUNTER — Encounter (HOSPITAL_COMMUNITY)
Admission: RE | Admit: 2023-09-12 | Discharge: 2023-09-12 | Disposition: A | Discharge status: Home / Self Care | Source: Ambulatory Visit | Attending: Gynecologic Oncology | Admitting: Gynecologic Oncology

## 2023-09-12 ENCOUNTER — Other Ambulatory Visit: Payer: Self-pay

## 2023-09-12 ENCOUNTER — Inpatient Hospital Stay (HOSPITAL_BASED_OUTPATIENT_CLINIC_OR_DEPARTMENT_OTHER): Admitting: Gynecologic Oncology

## 2023-09-12 ENCOUNTER — Encounter (HOSPITAL_COMMUNITY): Payer: Self-pay

## 2023-09-12 ENCOUNTER — Other Ambulatory Visit (HOSPITAL_COMMUNITY)

## 2023-09-12 VITALS — BP 123/61 | HR 86 | Temp 97.8°F | Resp 16 | Ht 64.0 in | Wt 130.0 lb

## 2023-09-12 DIAGNOSIS — Z955 Presence of coronary angioplasty implant and graft: Secondary | ICD-10-CM | POA: Diagnosis not present

## 2023-09-12 DIAGNOSIS — N83202 Unspecified ovarian cyst, left side: Secondary | ICD-10-CM

## 2023-09-12 DIAGNOSIS — I2510 Atherosclerotic heart disease of native coronary artery without angina pectoris: Secondary | ICD-10-CM | POA: Insufficient documentation

## 2023-09-12 DIAGNOSIS — Z01812 Encounter for preprocedural laboratory examination: Secondary | ICD-10-CM | POA: Diagnosis not present

## 2023-09-12 DIAGNOSIS — N83201 Unspecified ovarian cyst, right side: Secondary | ICD-10-CM | POA: Insufficient documentation

## 2023-09-12 DIAGNOSIS — I10 Essential (primary) hypertension: Secondary | ICD-10-CM | POA: Insufficient documentation

## 2023-09-12 HISTORY — DX: Bladder-neck obstruction: N32.0

## 2023-09-12 MED ORDER — SENNOSIDES-DOCUSATE SODIUM 8.6-50 MG PO TABS: 2.0000 | ORAL_TABLET | Freq: Every day | ORAL | 0 refills | Status: DC

## 2023-09-12 MED ORDER — TRAMADOL HCL 50 MG PO TABS: 50.0000 mg | ORAL_TABLET | Freq: Four times a day (QID) | ORAL | 0 refills | Status: DC | PRN

## 2023-09-12 NOTE — Progress Notes (Signed)
Anesthesia Chart Review   Case: [id number] Date/Time: 09/13/23 1300   Procedure: SALPINGO-OOPHORECTOMY, BILATERAL, ROBOT-ASSISTED (Bilateral) - WITH POSSIBLE STAGING   Anesthesia type: General   Diagnosis: Bilateral ovarian cysts [N83.201, N83.202]   Pre-op diagnosis: Bilateral ovarian cysts   Location: [facility] ROOM 05 / [facility]   Surgeons: Suzi Essex, MD       DISCUSSION:83 y.o. never smoker with h/o HTN, CAD s/p DES 2012, bilateral ovarian cyst scheduled for above procedure 09/13/2023 with Dr. Wiley Hanger.   Pt last seen by cardiology 05/08/2023. Per notes pt is very active without cad sx, no changes made at this visit. 1 year follow up recommended.  VS: BP 115/78   Pulse 96   Temp 36.9 C (Oral)   Ht 5\' 4"  (1.626 m)   Wt 58.1 kg   SpO2 97%   BMI 21.97 kg/m   PROVIDERS: Allene Ivan, MD is PCP   Cardiologist - Arnoldo Lapping, MD  LABS: Labs reviewed: Acceptable for surgery. (all labs ordered are listed, but only abnormal results are displayed)  Labs Reviewed  TYPE AND SCREEN     IMAGES:   EKG:   CV: Myocardial Perfusion 04/08/2020 The left ventricular ejection fraction is normal (55-65%). Nuclear stress EF: 65%. Blood pressure demonstrated a normal response to exercise. There was no ST segment deviation noted during stress. The study is normal. This is a low risk study.   Normal resting and stress perfusion. No ischemia or infarction EF 64% Past Medical History:  Diagnosis Date   Allergy    Anemia    Arthritis    CAD (coronary artery disease)    Dr Mitzie Anda   Cancer [facility])    BCC nose   Cataract    both eyes- surgically  removed   Diverticulosis of colon    GERD (gastroesophageal reflux disease)    Herpes zoster    1997 L flank   Hyperlipidemia    intol. to statins in past   Hypertension    Osteopenia    Dr Cloretta Danes, Gynecology   Osteopenia    Scoliosis    Stenosis (acquired) of bladder neck or vesicourethral orifice    lower back     Past Surgical History:  Procedure Laterality Date   CATARACT EXTRACTION  2010   OD  , Dr. Lasandra Points   CATARACT EXTRACTION W/ INTRAOCULAR LENS IMPLANT Left 02/2014   Vision not significantly improved   COLONOSCOPY  2007   [facility]   CORONARY ANGIOPLASTY WITH STENT PLACEMENT  06/19/2010   2 vessel; Dr Arlester Ladd   LUMBAR DISC SURGERY  11/03/2014   VARICOSE VEIN SURGERY  1971    MEDICATIONS:  acetaminophen  (TYLENOL ) 500 MG tablet   amLODipine  (NORVASC ) 5 MG tablet   aspirin 81 MG tablet   BLACK CURRANT SEED OIL PO   Calcium  Citrate-Vitamin D  (CITRUS CALCIUM  1500 + D PO)   famotidine  (PEPCID ) 40 MG tablet   GELATIN PO   lidocaine 4 %   Multiple Vitamin (MULTIVITAMIN) capsule   naproxen sodium (ALEVE) 220 MG tablet   nitroGLYCERIN (NITROSTAT) 0.4 MG SL tablet   pantoprazole  (PROTONIX ) 40 MG tablet   PROLIA 60 MG/ML SOSY injection   rosuvastatin  (CRESTOR ) 10 MG tablet   SACCHAROMYCES BOULARDII PO   senna-docusate (SENOKOT-S) 8.6-50 MG tablet   spironolactone (ALDACTONE) 25 MG tablet   traMADol  (ULTRAM ) 50 MG tablet   No current facility-administered medications for this encounter.     Chick Cotton Ward, PA-C [facility] [phone])  [phone]      

## 2023-09-12 NOTE — Telephone Encounter (Signed)
Telephone call to check on pre-operative status.  Patient compliant with pre-operative instructions.  Reinforced nothing to eat after midnight. Clear liquids until 1000. Patient to arrive at 1100.  No questions or concerns voiced.  Instructed to call for any needs.

## 2023-09-12 NOTE — Progress Notes (Addendum)
 For Anesthesia: PCP - Allene Ivan, MD  Cardiologist - Arnoldo Lapping, MD. LOV: 04/18/22   Bowel Prep reminder:  Chest x-ray -  EKG - 05/08/23 Stress Test -  ECHO - 06/13/12 Cardiac Cath - 06/19/10 Pacemaker/ICD device last checked: Pacemaker orders received: Device Rep notified:  Spinal Cord Stimulator:N/A  Sleep Study - N/A CPAP -   Fasting Blood Sugar - N/A Checks Blood Sugar _____ times a day Date and result of last Hgb A1c-  Last dose of GLP1 agonist- N/A GLP1 instructions:   Last dose of SGLT-2 inhibitors- N/A SGLT-2 instructions:   Blood Thinner Instructions:N/A Aspirin Instructions: Last Dose:  Activity level: Can go up a flight of stairs and activities of daily living without stopping and without chest pain and/or shortness of breath   Able to exercise without chest pain and/or shortness of breath  Anesthesia review: Hx: CAD,HTN  Patient denies shortness of breath, fever, cough and chest pain at PAT appointment   Patient verbalized understanding of instructions that were given to them at the PAT appointment. Patient was also instructed that they will need to review over the PAT instructions again at home before surgery.

## 2023-09-13 ENCOUNTER — Ambulatory Visit (HOSPITAL_COMMUNITY): Admitting: Anesthesiology

## 2023-09-13 ENCOUNTER — Encounter (HOSPITAL_COMMUNITY)
Admission: RE | Disposition: A | Discharge status: Home / Self Care | Payer: Self-pay | Source: Home / Self Care | Attending: Gynecologic Oncology

## 2023-09-13 ENCOUNTER — Ambulatory Visit (HOSPITAL_COMMUNITY): Payer: Self-pay | Admitting: Physician Assistant

## 2023-09-13 ENCOUNTER — Encounter (HOSPITAL_COMMUNITY): Payer: Self-pay | Admitting: Gynecologic Oncology

## 2023-09-13 ENCOUNTER — Other Ambulatory Visit: Payer: Self-pay

## 2023-09-13 ENCOUNTER — Ambulatory Visit (HOSPITAL_COMMUNITY)
Admission: RE | Admit: 2023-09-13 | Discharge: 2023-09-13 | Disposition: A | Discharge status: Home / Self Care | Attending: Gynecologic Oncology | Admitting: Gynecologic Oncology

## 2023-09-13 DIAGNOSIS — K219 Gastro-esophageal reflux disease without esophagitis: Secondary | ICD-10-CM | POA: Diagnosis not present

## 2023-09-13 DIAGNOSIS — I10 Essential (primary) hypertension: Secondary | ICD-10-CM | POA: Diagnosis not present

## 2023-09-13 DIAGNOSIS — N83201 Unspecified ovarian cyst, right side: Secondary | ICD-10-CM | POA: Diagnosis present

## 2023-09-13 DIAGNOSIS — N83202 Unspecified ovarian cyst, left side: Secondary | ICD-10-CM

## 2023-09-13 DIAGNOSIS — N72 Inflammatory disease of cervix uteri: Secondary | ICD-10-CM | POA: Diagnosis not present

## 2023-09-13 DIAGNOSIS — N8353 Torsion of ovary, ovarian pedicle and fallopian tube: Secondary | ICD-10-CM | POA: Diagnosis not present

## 2023-09-13 DIAGNOSIS — N8003 Adenomyosis of the uterus: Secondary | ICD-10-CM | POA: Diagnosis not present

## 2023-09-13 DIAGNOSIS — Z955 Presence of coronary angioplasty implant and graft: Secondary | ICD-10-CM | POA: Insufficient documentation

## 2023-09-13 DIAGNOSIS — I2510 Atherosclerotic heart disease of native coronary artery without angina pectoris: Secondary | ICD-10-CM | POA: Insufficient documentation

## 2023-09-13 DIAGNOSIS — D271 Benign neoplasm of left ovary: Secondary | ICD-10-CM

## 2023-09-13 DIAGNOSIS — D270 Benign neoplasm of right ovary: Secondary | ICD-10-CM | POA: Diagnosis not present

## 2023-09-13 HISTORY — PX: ROBOTIC ASSISTED TOTAL HYSTERECTOMY WITH BILATERAL SALPINGO OOPHERECTOMY: SHX6086

## 2023-09-13 LAB — TYPE AND SCREEN
ABO/RH(D): O POS
Antibody Screen: NEGATIVE

## 2023-09-13 LAB — ABO/RH: ABO/RH(D): O POS

## 2023-09-13 SURGERY — HYSTERECTOMY, TOTAL, ROBOT-ASSISTED, LAPAROSCOPIC, WITH BILATERAL SALPINGO-OOPHORECTOMY
Anesthesia: General | Site: Abdomen | Laterality: Bilateral

## 2023-09-13 MED ORDER — ONDANSETRON HCL 4 MG PO TABS: 4.0000 mg | ORAL_TABLET | Freq: Four times a day (QID) | ORAL | Status: DC | PRN

## 2023-09-13 MED ORDER — SUGAMMADEX SODIUM 200 MG/2ML IV SOLN
INTRAVENOUS | Status: DC | PRN
  Administered 2023-09-13: 240 mg via INTRAVENOUS

## 2023-09-13 MED ORDER — DEXAMETHASONE SODIUM PHOSPHATE 10 MG/ML IJ SOLN: 4.0000 mg | INTRAMUSCULAR | Status: DC

## 2023-09-13 MED ORDER — FENTANYL CITRATE (PF) 100 MCG/2ML IJ SOLN
INTRAMUSCULAR | Status: AC
Start: 2023-09-13 — End: 2023-09-13
  Filled 2023-09-13: qty 2

## 2023-09-13 MED ORDER — PHENYLEPHRINE HCL-NACL 20-0.9 MG/250ML-% IV SOLN
INTRAVENOUS | Status: DC | PRN
  Administered 2023-09-13: 25 ug/min via INTRAVENOUS

## 2023-09-13 MED ORDER — DEXAMETHASONE SODIUM PHOSPHATE 10 MG/ML IJ SOLN
INTRAMUSCULAR | Status: AC
  Filled 2023-09-13: qty 2

## 2023-09-13 MED ORDER — PHENYLEPHRINE 80 MCG/ML (10ML) SYRINGE FOR IV PUSH (FOR BLOOD PRESSURE SUPPORT)
PREFILLED_SYRINGE | INTRAVENOUS | Status: AC
  Filled 2023-09-13: qty 20

## 2023-09-13 MED ORDER — STERILE WATER FOR INJECTION IJ SOLN
INTRAMUSCULAR | Status: AC
Start: 2023-09-13 — End: 2023-09-13
  Filled 2023-09-13: qty 10

## 2023-09-13 MED ORDER — CEFAZOLIN SODIUM-DEXTROSE 2-3 GM-%(50ML) IV SOLR
INTRAVENOUS | Status: DC | PRN
  Administered 2023-09-13: 2 g via INTRAVENOUS

## 2023-09-13 MED ORDER — DROPERIDOL 2.5 MG/ML IJ SOLN
0.6250 mg | Freq: Once | INTRAMUSCULAR | Status: AC | PRN
  Administered 2023-09-13: 0.625 mg via INTRAVENOUS

## 2023-09-13 MED ORDER — CHLORHEXIDINE GLUCONATE 0.12 % MT SOLN
15.0000 mL | Freq: Once | OROMUCOSAL | Status: AC
  Administered 2023-09-13: 15 mL via OROMUCOSAL

## 2023-09-13 MED ORDER — HYDROMORPHONE HCL 1 MG/ML IJ SOLN
0.2500 mg | INTRAMUSCULAR | Status: DC | PRN
  Administered 2023-09-13: 0.25 mg via INTRAVENOUS

## 2023-09-13 MED ORDER — ROCURONIUM BROMIDE 10 MG/ML (PF) SYRINGE
PREFILLED_SYRINGE | INTRAVENOUS | Status: DC | PRN
  Administered 2023-09-13: 20 mg via INTRAVENOUS
  Administered 2023-09-13: 60 mg via INTRAVENOUS

## 2023-09-13 MED ORDER — ACETAMINOPHEN 500 MG PO TABS: 1000.0000 mg | ORAL_TABLET | Freq: Once | ORAL | Status: DC

## 2023-09-13 MED ORDER — PROPOFOL 10 MG/ML IV BOLUS
INTRAVENOUS | Status: DC | PRN
  Administered 2023-09-13: 130 mg via INTRAVENOUS

## 2023-09-13 MED ORDER — STERILE WATER FOR IRRIGATION IR SOLN
Status: DC | PRN
  Administered 2023-09-13: 1000 mL

## 2023-09-13 MED ORDER — LACTATED RINGERS IR SOLN
Status: DC | PRN
  Administered 2023-09-13: 1000 mL

## 2023-09-13 MED ORDER — ROCURONIUM BROMIDE 10 MG/ML (PF) SYRINGE
PREFILLED_SYRINGE | INTRAVENOUS | Status: AC
  Filled 2023-09-13: qty 20

## 2023-09-13 MED ORDER — HEPARIN SODIUM (PORCINE) 5000 UNIT/ML IJ SOLN
5000.0000 [IU] | INTRAMUSCULAR | Status: AC
  Administered 2023-09-13: 5000 [IU] via SUBCUTANEOUS
  Filled 2023-09-13: qty 1

## 2023-09-13 MED ORDER — FENTANYL CITRATE PF 50 MCG/ML IJ SOSY
PREFILLED_SYRINGE | INTRAMUSCULAR | Status: AC
  Filled 2023-09-13: qty 2

## 2023-09-13 MED ORDER — FENTANYL CITRATE PF 50 MCG/ML IJ SOSY
25.0000 ug | PREFILLED_SYRINGE | INTRAMUSCULAR | Status: DC | PRN
  Administered 2023-09-13 (×3): 50 ug via INTRAVENOUS

## 2023-09-13 MED ORDER — LIDOCAINE 2% (20 MG/ML) 5 ML SYRINGE
INTRAMUSCULAR | Status: DC | PRN
  Administered 2023-09-13: 60 mg via INTRAVENOUS

## 2023-09-13 MED ORDER — TRAMADOL HCL 50 MG PO TABS: 50.0000 mg | ORAL_TABLET | Freq: Four times a day (QID) | ORAL | Status: DC | PRN

## 2023-09-13 MED ORDER — HYDROMORPHONE HCL 1 MG/ML IJ SOLN
INTRAMUSCULAR | Status: AC
  Filled 2023-09-13: qty 1

## 2023-09-13 MED ORDER — LACTATED RINGERS IV SOLN
INTRAVENOUS | Status: DC
Start: 2023-09-13 — End: 2023-09-13

## 2023-09-13 MED ORDER — BUPIVACAINE HCL (PF) 0.25 % IJ SOLN
INTRAMUSCULAR | Status: AC
Start: 2023-09-13 — End: 2023-09-13
  Filled 2023-09-13: qty 30

## 2023-09-13 MED ORDER — ONDANSETRON HCL 4 MG/2ML IJ SOLN
INTRAMUSCULAR | Status: AC
  Filled 2023-09-13: qty 2

## 2023-09-13 MED ORDER — ORAL CARE MOUTH RINSE: 15.0000 mL | Freq: Once | OROMUCOSAL | Status: AC

## 2023-09-13 MED ORDER — DEXAMETHASONE SODIUM PHOSPHATE 10 MG/ML IJ SOLN
INTRAMUSCULAR | Status: DC | PRN
  Administered 2023-09-13: 5 mg via INTRAVENOUS

## 2023-09-13 MED ORDER — DROPERIDOL 2.5 MG/ML IJ SOLN
INTRAMUSCULAR | Status: AC
  Filled 2023-09-13: qty 2

## 2023-09-13 MED ORDER — PROPOFOL 10 MG/ML IV BOLUS
INTRAVENOUS | Status: AC
  Filled 2023-09-13: qty 20

## 2023-09-13 MED ORDER — ACETAMINOPHEN 500 MG PO TABS
1000.0000 mg | ORAL_TABLET | ORAL | Status: AC
  Administered 2023-09-13: 1000 mg via ORAL
  Filled 2023-09-13: qty 2

## 2023-09-13 MED ORDER — EPHEDRINE 5 MG/ML INJ
INTRAVENOUS | Status: AC
  Filled 2023-09-13: qty 5

## 2023-09-13 MED ORDER — ONDANSETRON HCL 4 MG/2ML IJ SOLN
INTRAMUSCULAR | Status: AC
  Filled 2023-09-13: qty 6

## 2023-09-13 MED ORDER — FENTANYL CITRATE PF 50 MCG/ML IJ SOSY
PREFILLED_SYRINGE | INTRAMUSCULAR | Status: AC
  Filled 2023-09-13: qty 1

## 2023-09-13 MED ORDER — BUPIVACAINE HCL 0.25 % IJ SOLN
INTRAMUSCULAR | Status: DC | PRN
  Administered 2023-09-13: 24 mL

## 2023-09-13 MED ORDER — FENTANYL CITRATE (PF) 250 MCG/5ML IJ SOLN
INTRAMUSCULAR | Status: DC | PRN
  Administered 2023-09-13 (×4): 50 ug via INTRAVENOUS

## 2023-09-13 MED ORDER — ONDANSETRON HCL 4 MG/2ML IJ SOLN
INTRAMUSCULAR | Status: DC | PRN
  Administered 2023-09-13: 4 mg via INTRAVENOUS

## 2023-09-13 MED ORDER — ONDANSETRON HCL 4 MG/2ML IJ SOLN
4.0000 mg | Freq: Four times a day (QID) | INTRAMUSCULAR | Status: DC | PRN
  Administered 2023-09-13: 4 mg via INTRAVENOUS

## 2023-09-13 SURGICAL SUPPLY — 66 items
APPLICATOR SURGIFLO ENDO (HEMOSTASIS) IMPLANT
BAG COUNTER SPONGE SURGICOUNT (BAG) IMPLANT
BAG LAPAROSCOPIC 12 15 PORT 16 (BASKET) ×1 IMPLANT
BLADE SURG SZ10 CARB STEEL (BLADE) IMPLANT
COVER BACK TABLE 60X90IN (DRAPES) ×2 IMPLANT
COVER TIP SHEARS 8 DVNC (MISCELLANEOUS) ×2 IMPLANT
DERMABOND ADVANCED .7 DNX12 (GAUZE/BANDAGES/DRESSINGS) ×2 IMPLANT
DRAPE ARM DVNC X/XI (DISPOSABLE) ×8 IMPLANT
DRAPE COLUMN DVNC XI (DISPOSABLE) ×2 IMPLANT
DRAPE SHEET LG 3/4 BI-LAMINATE (DRAPES) ×2 IMPLANT
DRAPE SURG IRRIG POUCH 19X23 (DRAPES) ×2 IMPLANT
DRIVER NDL MEGA SUTCUT DVNCXI (INSTRUMENTS) ×1 IMPLANT
DRIVER NDLE MEGA SUTCUT DVNCXI (INSTRUMENTS) ×2 IMPLANT
DRSG OPSITE POSTOP 4X6 (GAUZE/BANDAGES/DRESSINGS) IMPLANT
DRSG OPSITE POSTOP 4X8 (GAUZE/BANDAGES/DRESSINGS) IMPLANT
ELECT PENCIL ROCKER SW 15FT (MISCELLANEOUS) IMPLANT
ELECT REM PT RETURN 15FT ADLT (MISCELLANEOUS) ×2 IMPLANT
FORCEPS BPLR FENES DVNC XI (FORCEP) ×2 IMPLANT
FORCEPS PROGRASP DVNC XI (FORCEP) ×2 IMPLANT
GAUZE 4X4 16PLY ~~LOC~~+RFID DBL (SPONGE) ×4 IMPLANT
GLOVE BIO SURGEON STRL SZ 6 (GLOVE) ×8 IMPLANT
GLOVE BIO SURGEON STRL SZ 6.5 (GLOVE) ×2 IMPLANT
GOWN STRL REUS W/ TWL LRG LVL3 (GOWN DISPOSABLE) ×8 IMPLANT
GRASPER SUT TROCAR 14GX15 (MISCELLANEOUS) IMPLANT
HOLDER FOLEY CATH W/STRAP (MISCELLANEOUS) IMPLANT
IRRIGATION SUCT STRKRFLW 2 WTP (MISCELLANEOUS) ×2 IMPLANT
KIT PROCEDURE DVNC SI (MISCELLANEOUS) IMPLANT
KIT TURNOVER KIT A (KITS) IMPLANT
LIGASURE IMPACT 36 18CM CVD LR (INSTRUMENTS) IMPLANT
MANIPULATOR ADVINCU DEL 3.0 PL (MISCELLANEOUS) IMPLANT
MANIPULATOR ADVINCU DEL 3.5 PL (MISCELLANEOUS) IMPLANT
MANIPULATOR UTERINE 4.5 ZUMI (MISCELLANEOUS) ×1 IMPLANT
NDL HYPO 21X1.5 SAFETY (NEEDLE) ×1 IMPLANT
NDL SPNL 18GX3.5 QUINCKE PK (NEEDLE) IMPLANT
NEEDLE HYPO 21X1.5 SAFETY (NEEDLE) ×2 IMPLANT
NEEDLE SPNL 18GX3.5 QUINCKE PK (NEEDLE) IMPLANT
OBTURATOR OPTICALSTD 8 DVNC (TROCAR) ×2 IMPLANT
PACK ROBOT GYN CUSTOM WL (TRAY / TRAY PROCEDURE) ×2 IMPLANT
PAD POSITIONING PINK XL (MISCELLANEOUS) ×2 IMPLANT
PORT ACCESS TROCAR AIRSEAL 12 (TROCAR) IMPLANT
SCISSORS MNPLR CVD DVNC XI (INSTRUMENTS) ×2 IMPLANT
SCRUB CHG 4% DYNA-HEX 4OZ (MISCELLANEOUS) IMPLANT
SEAL UNIV 5-12 XI (MISCELLANEOUS) ×8 IMPLANT
SET TRI-LUMEN FLTR TB AIRSEAL (TUBING) ×2 IMPLANT
SPIKE FLUID TRANSFER (MISCELLANEOUS) ×2 IMPLANT
SPONGE T-LAP 18X18 ~~LOC~~+RFID (SPONGE) IMPLANT
SURGIFLO W/THROMBIN 8M KIT (HEMOSTASIS) IMPLANT
SUT MNCRL AB 4-0 PS2 18 (SUTURE) IMPLANT
SUT PDS AB 1 TP1 96 (SUTURE) IMPLANT
SUT STRATA PDS 0 30 CT-2.5 (SUTURE) IMPLANT
SUT V-LOC 180 0-0 GS22 (SUTURE) IMPLANT
SUT VIC AB 0 CT1 27XBRD ANTBC (SUTURE) IMPLANT
SUT VIC AB 2-0 CT1 TAPERPNT 27 (SUTURE) IMPLANT
SUT VIC AB 2-0 SH 27X BRD (SUTURE) IMPLANT
SUT VIC AB 4-0 PS2 18 (SUTURE) ×4 IMPLANT
SUT VICRYL 0 27 CT2 27 ABS (SUTURE) ×2 IMPLANT
SUT VLOC 180 0 9IN GS21 (SUTURE) IMPLANT
SYR 10ML LL (SYRINGE) IMPLANT
SYSTEM BAG RETRIEVAL 10MM (BASKET) IMPLANT
SYSTEM WOUND ALEXIS 18CM MED (MISCELLANEOUS) IMPLANT
TRAP SPECIMEN MUCUS 40CC (MISCELLANEOUS) IMPLANT
TRAY FOLEY MTR SLVR 16FR STAT (SET/KITS/TRAYS/PACK) ×2 IMPLANT
TROCAR PORT AIRSEAL 5X120 (TROCAR) ×1 IMPLANT
UNDERPAD 30X36 HEAVY ABSORB (UNDERPADS AND DIAPERS) ×4 IMPLANT
WATER STERILE IRR 1000ML POUR (IV SOLUTION) ×2 IMPLANT
YANKAUER SUCT BULB TIP 10FT TU (MISCELLANEOUS) IMPLANT

## 2023-09-13 NOTE — Anesthesia Preprocedure Evaluation (Signed)
 Anesthesia Evaluation  Patient identified by MRN, date of birth, ID band Patient awake    Reviewed: Allergy & Precautions, NPO status , Patient's Chart, lab work & pertinent test results  Airway Mallampati: II  TM Distance: >3 FB Neck ROM: Full    Dental no notable dental hx.    Pulmonary neg pulmonary ROS   Pulmonary exam normal        Cardiovascular hypertension, Pt. on medications + CAD and + Cardiac Stents   Rhythm:Regular Rate:Normal     Neuro/Psych negative neurological ROS  negative psych ROS   GI/Hepatic Neg liver ROS,GERD  Medicated,,  Endo/Other  negative endocrine ROS    Renal/GU negative Renal ROS  negative genitourinary   Musculoskeletal  (+) Arthritis , Osteoarthritis,    Abdominal Normal abdominal exam  (+)   Peds  Hematology  (+) Blood dyscrasia, anemia Lab Results      Component                Value               Date                      WBC                      7.1                 09/01/2023                HGB                      12.6                09/01/2023                HCT                      38.4                09/01/2023                MCV                      90.1                09/01/2023                PLT                      307                 09/01/2023             Lab Results      Component                Value               Date                      NA                       138                 09/01/2023                K  4.6                 09/01/2023                CO2                      27                  09/01/2023                GLUCOSE                  120 (H)             09/01/2023                BUN                      16                  09/01/2023                CREATININE               1.01 (H)            09/01/2023                CALCIUM                   9.6                 09/01/2023                GFR                      72.64                11/07/2018                GFRNONAA                 56 (L)              09/01/2023              Anesthesia Other Findings   Reproductive/Obstetrics                             Anesthesia Physical Anesthesia Plan  ASA: 3  Anesthesia Plan: General   Post-op Pain Management: Tylenol  PO (pre-op)*   Induction: Intravenous  PONV Risk Score and Plan: 3 and Ondansetron , Dexamethasone  and Treatment may vary due to age or medical condition  Airway Management Planned: Mask and Oral ETT  Additional Equipment: None  Intra-op Plan:   Post-operative Plan: Extubation in OR  Informed Consent: I have reviewed the patients History and Physical, chart, labs and discussed the procedure including the risks, benefits and alternatives for the proposed anesthesia with the patient or authorized representative who has indicated his/her understanding and acceptance.     Dental advisory given  Plan Discussed with: CRNA  Anesthesia Plan Comments:        Anesthesia Quick Evaluation

## 2023-09-13 NOTE — Interval H&P Note (Addendum)
 History and Physical Interval Note:  09/13/2023 11:17 AM  [name]  has presented today for surgery, with the diagnosis of Bilateral ovarian cysts.  The various methods of treatment have been discussed with the patient and family. After consideration of risks, benefits and other options for treatment, the patient has consented to  Procedure(s) with comments: SALPINGO-OOPHORECTOMY, BILATERAL, ROBOT-ASSISTED (Bilateral) - WITH POSSIBLE STAGING  Total hysterectomy as a surgical intervention.  The patient's history has been reviewed, patient examined, no change in status, stable for surgery.  I have reviewed the patient's chart and labs.  Questions were answered to the patient's satisfaction.     Suzi Essex   Discussed with patient today risks/benefits of concurrent hysterectomy today if benign pathology. Reviewed risks (time under anesthesia, surgical risk) and benefits (smaller incisions, decreased need for gyn surgery in the future). She would like to proceed with concurrent total hysterectomy.

## 2023-09-13 NOTE — Discharge Instructions (Addendum)
 AFTER SURGERY INSTRUCTIONS   Return to work: 4-6 weeks if applicable  Today Dr. Orvil Bland removed your uterus, cervix, both ovaries and fallopian tubes. It appeared the right ovary may have twisted on its blood supply (we call this torsion), which could explain the pain and throwing up you had several weeks ago. The pathologist DID NOT feel the ovarian cysts were concerning for cancer and we will wait for the final pathology to return.   Activity: 1. Be up and out of the bed during the day.  Take a nap if needed.  You may walk up steps but be careful and use the hand rail.  Stair climbing will tire you more than you think, you may need to stop part way and rest.    2. No lifting or straining for 6 weeks over 10 pounds. No pushing, pulling, straining for 6 weeks.   3. No driving for 4-09 days when the following criteria have been met: Do not drive if you are taking narcotic pain medicine and make sure that your reaction time has returned.    4. You can shower as soon as the next day after surgery. Shower daily.  Use your regular soap and water (not directly on the incision) and pat your incision(s) dry afterwards; don't rub.  No tub baths or submerging your body in water until cleared by your surgeon. If you have the soap that was given to you by pre-surgical testing that was used before surgery, you do not need to use it afterwards because this can irritate your incisions.    5. No sexual activity and nothing in the vagina for 12 weeks since you had a hysterectomy.   6. You may experience a small amount of clear drainage from your incisions, which is normal.  If the drainage persists, increases, or changes color please call the office.   7. Do not use creams, lotions, or ointments such as neosporin on your incisions after surgery until advised by your surgeon because they can cause removal of the dermabond glue on your incisions.     8. You may experience vaginal spotting after surgery or when the  stitches at the top of the vagina begin to dissolve (since you had a hysterectomy).  The spotting is normal but if you experience heavy bleeding, call our office.   9. Take Tylenol  or ibuprofen (OR ALEVE, do not take with ibuprofen since they work similarly) first for pain if you are able to take these medications and only use narcotic pain medication for severe pain not relieved by the Tylenol  or Ibuprofen.  Monitor your Tylenol  intake to a max of 4,000 mg in a 24 hour period. You can alternate these medications after surgery.   Diet: 1. Low sodium Heart Healthy Diet is recommended but you are cleared to resume your normal (before surgery) diet after your procedure.   2. It is safe to use a laxative, such as Miralax or Colace, if you have difficulty moving your bowels before surgery. You have been prescribed Sennakot-S to take at bedtime every evening after surgery to keep bowel movements regular and to prevent constipation.     Wound Care: 1. Keep clean and dry.  Shower daily.   Reasons to call the Doctor: Fever - Oral temperature greater than 100.4 degrees Fahrenheit Foul-smelling vaginal discharge Difficulty urinating Nausea and vomiting Increased pain at the site of the incision that is unrelieved with pain medicine. Difficulty breathing with or without chest pain New calf pain  especially if only on one side Sudden, continuing increased vaginal bleeding with or without clots.   Contacts: For questions or concerns you should contact:   Dr. Wiley Hanger at [phone]   Vira Grieves, NP at [phone]   After Hours: call [phone] and have the GYN Oncologist paged/contacted (after 5 pm or on the weekends). You will speak with an after hours RN and let he or she know you have had surgery.   Messages sent via mychart are for non-urgent matters and are not responded to after hours so for urgent needs, please call the after hours number.

## 2023-09-13 NOTE — Op Note (Signed)
 OPERATIVE NOTE  Pre-operative Diagnosis: Enlarging symptomatic adnexal masses  Post-operative Diagnosis: same, right ovarian torsion, benign appearing bilateral cysts on gross examination  Operation: Robotic-assisted laparoscopic total hysterectomy with bilateral salpingo-oophorectomy  Surgeon: Wiley Hanger MD  Assistant Surgeon: Vira Grieves, NP (an NP assistant was necessary for tissue manipulation, management of robotic instrumentation, retraction and positioning due to the complexity of the case and hospital policies).   Anesthesia: GET  Urine Output: 100 cc  Operative Findings: On EUA, small mobile uterus. 8 cm mass fills the cul de sac, limited mobility. On intra-abdominal entry, normal upper abdominal survey. Small area of omentum adherent to the anterior abdominal wall on the right above the level of the umbilicus. Normal small bowel. Significant diverticulosis (pictures in media). Left ovary with 4 cm smooth appearing cyst. Right ovary with 8 cm smooth appearing cyst, torsed x1. Normal appearing bilateral tubes. Uterus 6 cm and normal in appearance. No ascites.  Bilateral adnexa sent for frozen - nothing to freeze (benign smooth and simple cysts bilaterally).  Estimated Blood Loss:  25 cc      Total IV Fluids: see I&O flowsheet         Specimens: uterus, cervix, bilateral tubes and ovaries, pelvic washings         Complications:  None apparent; patient tolerated the procedure well.         Disposition: PACU - hemodynamically stable.  Procedure Details  The patient was seen in the Holding Room. The risks, benefits, complications, treatment options, and expected outcomes were discussed with the patient.  The patient concurred with the proposed plan, giving informed consent.  The site of surgery properly noted/marked. The patient was identified as [name] and the procedure verified as a Robotic-assisted hysterectomy with bilateral salpingo-oophorectomy.   After  induction of anesthesia, the patient was draped and prepped in the usual sterile manner. Patient was placed in supine position after anesthesia and draped and prepped in the usual sterile manner as follows: Her arms were tucked to her side with all appropriate precautions.  The patient was secured to the bed using padding and tape across her chest.  The patient was placed in the semi-lithotomy position in [place] stirrups.  The perineum and vagina were prepped with CHG. The patient's abdomen was prepped with ChloraPrep and then she was draped after the prep had been allowed to dry for 3 minutes.  A Time Out was held and the above information confirmed.  The urethra was prepped with Betadine. Foley catheter was placed.  A sterile speculum was placed in the vagina.  The cervix was grasped with a single-tooth tenaculum. The cervix was dilated with Adriana Hopping dilators.  The ZUMI uterine manipulator with a medium colpotomizer ring was placed without difficulty.  A pneum occluder balloon was placed over the manipulator.  OG tube placement was confirmed and to suction.   Next, a 5 mm skin incision was made 1 cm below the subcostal margin in the midclavicular line.  The 5 mm Optiview port and scope was used for direct entry.  Opening pressure was under 10 mm CO2.  The abdomen was insufflated and the findings were noted as above.   At this point and all points during the procedure, the patient's intra-abdominal pressure did not exceed 15 mmHg. Next, an 8 mm skin incision was made superior to the umbilicus and a right and left port were placed about 8 cm lateral to the robot port on the right and left side.  A fourth  arm was placed on the right.  The 5 mm assist trocar was exchanged for a 5 mm airseal port. All ports were placed under direct visualization.  ON the right, the lateral port was placed inferiorly to small area of omentum adherent to the anterior abdominal wall. The patient was placed in steep Trendelenburg.  Bowel  was folded away into the upper abdomen.  The robot was docked in the normal manner.  The right and left peritoneum were opened parallel to the IP ligament to open the retroperitoneal spaces bilaterally. The round ligaments were transected. The ureter was noted to be on the medial leaf of the broad ligament.  The peritoneum above the ureter was incised and stretched and the infundibulopelvic ligament was skeletonized, cauterized and cut.    The posterior peritoneum was taken down to the level of the KOH ring.  The anterior peritoneum was also taken down.  The bladder flap was created to the level of the KOH ring.  The uterine artery on the right side was skeletonized, cauterized and cut in the normal manner.  A similar procedure was performed on the left.  The colpotomy was made and the uterus, cervix, bilateral ovaries and tubes were amputated. After the manipulator was removed, an Endocatch bag was inserted through the vagina and the specimen was placed in it and delivered through the vagina.  Pedicles were inspected and excellent hemostasis was achieved.  The entire specimen was sent to pathology for frozen section.  The colpotomy at the vaginal cuff was closed with 0 Vicryl with a figure of eight at each apex and 0 V-Loc to close the midportion of the cuff in a running manner.  Irrigation was used and excellent hemostasis was achieved.  At this point in the procedure was completed.  Robotic instruments were removed under direct visulaization.  The robot was undocked. The abdomen was desufflated. The subcuticular tissue was closed with 4-0 Vicryl and the skin was closed with 4-0 Monocryl in a subcuticular manner.  Dermabond was applied.    The vagina was swabbed with minimal bleeding noted. Foley catheter was removed.  All sponge, lap and needle counts were correct x  3.   The patient was transferred to the recovery room in stable condition.  Wiley Hanger, MD

## 2023-09-13 NOTE — Transfer of Care (Signed)
 Immediate Anesthesia Transfer of Care Note  Patient: [name]  Procedure(s) Performed: SALPINGO-OOPHORECTOMY, BILATERAL, ROBOT-ASSISTED (Bilateral: Pelvis)  Patient Location: PACU  Anesthesia Type:General  Level of Consciousness: drowsy and patient cooperative  Airway & Oxygen Therapy: Patient Spontanous Breathing  Post-op Assessment: Report given to RN and Post -op Vital signs reviewed and stable  Post vital signs: Reviewed and stable  Last Vitals:  Vitals Value Taken Time  BP 155/78 09/13/23 1503  Temp    Pulse 79 09/13/23 1507  Resp 17 09/13/23 1507  SpO2 94 % 09/13/23 1507  Vitals shown include unfiled device data.  Last Pain:  Vitals:   09/13/23 1132  TempSrc:   PainSc: 0-No pain         Complications: No notable events documented.

## 2023-09-13 NOTE — Anesthesia Procedure Notes (Signed)
 Procedure Name: Intubation Date/Time: 09/13/2023 1:04 PM  Performed by: Virgil Griffiths, CRNAPre-anesthesia Checklist: Patient identified, Emergency Drugs available, Suction available and Patient being monitored Patient Re-evaluated:Patient Re-evaluated prior to induction Oxygen Delivery Method: Circle System Utilized Preoxygenation: Pre-oxygenation with 100% oxygen Induction Type: IV induction Ventilation: Mask ventilation without difficulty Laryngoscope Size: Mac and 3 Grade View: Grade III Tube type: Oral Tube size: 7.0 mm Number of attempts: 1 Airway Equipment and Method: Stylet and Oral airway Placement Confirmation: positive ETCO2 and breath sounds checked- equal and bilateral Secured at: 22 cm Tube secured with: Tape Dental Injury: Teeth and Oropharynx as per pre-operative assessment

## 2023-09-14 ENCOUNTER — Telehealth: Payer: Self-pay | Admitting: *Deleted

## 2023-09-14 ENCOUNTER — Encounter (HOSPITAL_COMMUNITY): Payer: Self-pay | Admitting: Gynecologic Oncology

## 2023-09-14 NOTE — Telephone Encounter (Addendum)
 Spoke with Ms. [name] this morning. She states she is eating, drinking and urinating well. She has not had a BM yet but is passing gas. She is taking senokot as prescribed and encouraged her to drink plenty of water. She denies fever or chills. Incisions are dry and intact. She rates her pain 1/10. Pt took only 1 tramadol  last night and nothing today.     Instructed to call office with any fever, chills, purulent drainage, uncontrolled pain or any other questions or concerns. Patient verbalizes understanding.   Pt aware of post op appointments as well as the office number [phone] and after hours number [phone] to call if she has any questions or concerns  Patient wanted to pass on a message to Dr. Orvil Bland: Tell her thank you for all the wonderful care

## 2023-09-14 NOTE — Anesthesia Postprocedure Evaluation (Signed)
 Anesthesia Post Note  Patient: [name]  Procedure(s) Performed: HYSTERECTOMY, TOTAL, ROBOT-ASSISTED, LAPAROSCOPIC, WITH BILATERAL SALPINGO-OOPHORECTOMY (Bilateral: Abdomen)     Patient location during evaluation: PACU Anesthesia Type: General Level of consciousness: awake and alert Pain management: pain level controlled Vital Signs Assessment: post-procedure vital signs reviewed and stable Respiratory status: spontaneous breathing, nonlabored ventilation, respiratory function stable and patient connected to nasal cannula oxygen Cardiovascular status: blood pressure returned to baseline and stable Postop Assessment: no apparent nausea or vomiting Anesthetic complications: no   No notable events documented.  Last Vitals:  Vitals:   09/13/23 1645 09/13/23 1646  BP: (!) 153/66 (!) 153/66  Pulse: 83 86  Resp:    Temp:    SpO2: 95% 92%    Last Pain:  Vitals:   09/13/23 1644  TempSrc: Oral  PainSc: 4                  Loren Vicens P Cortnee Steinmiller

## 2023-09-15 ENCOUNTER — Encounter: Payer: Self-pay | Admitting: Gynecologic Oncology

## 2023-09-15 ENCOUNTER — Ambulatory Visit: Payer: Self-pay | Admitting: Gynecologic Oncology

## 2023-09-18 ENCOUNTER — Telehealth: Payer: Self-pay | Admitting: *Deleted

## 2023-09-18 LAB — CYTOLOGY - NON PAP

## 2023-09-18 NOTE — Telephone Encounter (Signed)
 Spoke with Ms. Tuccillo who called the office this morning stating she started having vaginal bleeding that is spotting only. Pt denies pain, fever or chills. Advised that vaginal spotting can be common after hysterectomy, but it the bleeding continues, increases, with or without clots or accompanies other symptoms to call the office back. Pt also reports following all activity restrictions.   Pt also states she is having rectal bleeding as well. Pt states this started on May 22nd. But it was only happening every few days. Pt reports it's daily since her surgery. Pt has been constipated and has only had small bowel movements since surgery, she reports straining and sees the blood in the toilet and on the tissue when she wipes. Pt states, I believe it's an anal fissure and or hemorrhoids.  Advised patient to continue taking 2 senokot in the evening with 8 oz of water , add miralax 1 capful twice daily, and she can also take 1 senokot in the am with 8 oz. Of water . Pt verbalized understanding. Advised her message would be relayed to providers and the office would call back with any new recommendations.

## 2023-09-20 ENCOUNTER — Inpatient Hospital Stay: Admitting: Gynecologic Oncology

## 2023-09-20 ENCOUNTER — Encounter: Payer: Self-pay | Admitting: Gynecologic Oncology

## 2023-09-20 DIAGNOSIS — Z90722 Acquired absence of ovaries, bilateral: Secondary | ICD-10-CM

## 2023-09-20 DIAGNOSIS — K5730 Diverticulosis of large intestine without perforation or abscess without bleeding: Secondary | ICD-10-CM

## 2023-09-20 DIAGNOSIS — D270 Benign neoplasm of right ovary: Secondary | ICD-10-CM

## 2023-09-20 DIAGNOSIS — N83201 Unspecified ovarian cyst, right side: Secondary | ICD-10-CM

## 2023-09-20 DIAGNOSIS — D271 Benign neoplasm of left ovary: Secondary | ICD-10-CM

## 2023-09-20 DIAGNOSIS — Z90710 Acquired absence of both cervix and uterus: Secondary | ICD-10-CM

## 2023-09-20 DIAGNOSIS — Z9079 Acquired absence of other genital organ(s): Secondary | ICD-10-CM

## 2023-09-20 NOTE — Telephone Encounter (Signed)
 Spoke with Ms. Sieh who states, My bowels are working and I feel better  Pt states she is still taking the senokot twice daily and the miralax. Pt reports her stools are loose but not watery. Advised patient to stop taking the miralax and add back if needed according to the consistency of the stools. continue taking the senokot and add in the miralax as needed. Pt was also reminded of her phone visit with Dr. Orvil Bland this evening. Pt verbalized understanding and thanked the office for calling.

## 2023-09-20 NOTE — Progress Notes (Signed)
 Gynecologic Oncology Telehealth Note: Gyn-Onc  I connected with [name] on 09/20/23 at  6:00 PM EDT by telephone and verified that I am speaking with the correct person using two identifiers.  I discussed the limitations, risks, security and privacy concerns of performing an evaluation and management service by telemedicine and the availability of in-person appointments. I also discussed with the patient that there may be a patient responsible charge related to this service. The patient expressed understanding and agreed to proceed.  Other persons participating in the visit and their role in the encounter: none.  Patient's location: home Provider's location: [facility]  Reason for Visit: follow-up  Treatment History: 12/2018: CT A/P Sigmoid diverticulosis, without evidence of diverticulitis. Chronic bilateral ovarian cystic lesions (5.4 x 4.1 cm right ovarian cyst (series 2/image 65), previously 5.0 x 4.0 cm. 3.6 x 3.0 cm left ovarian cyst (series 2/image 62), previously 3.4 x 2.8 cm.) . Annual follow-up ultrasound is suggested in a postmenopausal patient.  Cysts are noted as chronically stable, similar size on MRI 2015.   [facility] shows a uterus measuring 5.5 x 3.7 x 1.9 cm.  Left ovary measures 6.2 x 3.6 x 6.6 cm and has 2 cysts, one measuring up to 5.7 cm and 1 measuring up to 2.7 cm.  Left ovary measures 5.7 x 2.6 x 3.5 cm.  Left ovarian cyst measures up to 3.4 cm and a second cyst measuring up to 2.8 cm.  Impression is that bilateral ovaries contain simple appearing cyst with minimal change in size from last ultrasound.   CA-125 was performed on 4/25 and normal at 14.1.  Given increased pain, discussed option of surgery and plan was to move forward with definitive surgery.  09/13/23: Robotic-assisted laparoscopic total hysterectomy with bilateral salpingo-oophorectomy   Interval History: Doing well. Bowels loose but improvement.  Still  some spotting with bowel movements (from rectum). Vaginal bleeding has stopped as of today. Denies urinary symptoms. Appetite still recovering.   Past Medical/Surgical History: Past Medical History:  Diagnosis Date   Allergy    Anemia    Arthritis    CAD (coronary artery disease)    Dr Mitzie Anda   Cancer [facility])    BCC nose   Cataract    both eyes- surgically  removed   Diverticulosis of colon    GERD (gastroesophageal reflux disease)    Herpes zoster    1997 L flank   Hyperlipidemia    intol. to statins in past   Hypertension    Osteopenia    Dr Cloretta Danes, Gynecology   Osteopenia    Scoliosis    Stenosis (acquired) of bladder neck or vesicourethral orifice    lower back    Past Surgical History:  Procedure Laterality Date   CATARACT EXTRACTION  2010   OD  , Dr. Lasandra Points   CATARACT EXTRACTION W/ INTRAOCULAR LENS IMPLANT Left 02/2014   Vision not significantly improved   COLONOSCOPY  2007   [facility]   CORONARY ANGIOPLASTY WITH STENT PLACEMENT  06/19/2010   2 vessel; Dr Arlester Ladd   LUMBAR DISC SURGERY  11/03/2014   ROBOTIC ASSISTED TOTAL HYSTERECTOMY WITH BILATERAL SALPINGO OOPHERECTOMY Bilateral 09/13/2023   Procedure: HYSTERECTOMY, TOTAL, ROBOT-ASSISTED, LAPAROSCOPIC, WITH BILATERAL SALPINGO-OOPHORECTOMY;  Surgeon: Suzi Essex, MD;  Location: [facility];  Service: Gynecology;  Laterality: Bilateral;   VARICOSE VEIN SURGERY  1971    Family History  Problem Relation Age of Onset   Osteoarthritis Mother  Skin cancer Mother        ? melanoma   Heart failure Mother    CVA Mother 58   Esophageal cancer Father 20   Heart attack Father 54       smoker   Cancer Maternal Grandmother        ? GI   CVA Paternal Grandmother    Diabetes Paternal Grandmother        prediabetic   Deep vein thrombosis Paternal Grandmother    Asthma Son        EIB   Heart attack Maternal Uncle 43   Pancreatic disease Paternal Uncle        unsure if failure or cancer   Stomach cancer  Maternal Aunt    Colon cancer Neg Hx    Colon polyps Neg Hx    Rectal cancer Neg Hx    Breast cancer Neg Hx    Endometrial cancer Neg Hx    Ovarian cancer Neg Hx    Prostate cancer Neg Hx     Social History   Socioeconomic History   Marital status: Married    Spouse name: Not on file   Number of children: 4   Years of education: Not on file   Highest education level: Not on file  Occupational History   Not on file  Tobacco Use   Smoking status: Never   Smokeless tobacco: Never  Vaping Use   Vaping status: Never Used  Substance and Sexual Activity   Alcohol use: Yes    Comment: extremely rarely   Drug use: No   Sexual activity: Not on file  Other Topics Concern   Not on file  Social History Narrative   Exercise: hand weight, crunches, some walking   Social Drivers of Health   Financial Resource Strain: Not on file  Food Insecurity: No Food Insecurity (08/24/2022)   Hunger Vital Sign    Worried About Running Out of Food in the Last Year: Never true    Ran Out of Food in the Last Year: Never true  Transportation Needs: No Transportation Needs (08/24/2022)   PRAPARE - Administrator, Civil Service (Medical): No    Lack of Transportation (Non-Medical): No  Physical Activity: Not on file  Stress: Not on file  Social Connections: Socially Integrated (08/24/2022)   Social Connection and Isolation Panel    Frequency of Communication with Friends and Family: More than three times a week    Frequency of Social Gatherings with Friends and Family: Three times a week    Attends Religious Services: 1 to 4 times per year    Active Member of Clubs or Organizations: Yes    Attends Engineer, structural: More than 4 times per year    Marital Status: Married    Current Medications:  Current Outpatient Medications:    acetaminophen  (TYLENOL ) 500 MG tablet, Take 1,000 mg by mouth every 6 (six) hours as needed for mild pain (pain score 1-3) or moderate pain (pain  score 4-6)., Disp: , Rfl:    amLODipine  (NORVASC ) 5 MG tablet, TAKE 1 TABLET (5 MG TOTAL) BY MOUTH DAILY., Disp: 90 tablet, Rfl: 3   aspirin 81 MG tablet, Take 81 mg by mouth daily., Disp: , Rfl:    BLACK CURRANT SEED OIL PO, Take 535 mg by mouth daily., Disp: , Rfl:    Calcium  Citrate-Vitamin D  (CITRUS CALCIUM  1500 + D PO), Take 600 mg by mouth daily., Disp: , Rfl:  famotidine  (PEPCID ) 40 MG tablet, TAKE 1 TABLET BY MOUTH EVERYDAY AT BEDTIME, Disp: 90 tablet, Rfl: 3   GELATIN PO, Take 1,120 mg by mouth daily. 560 mg ea, Disp: , Rfl:    lidocaine  4 %, Place 1 patch onto the skin daily as needed (pain)., Disp: , Rfl:    Multiple Vitamin (MULTIVITAMIN) capsule, Take 1 capsule by mouth daily.  Centrum with vitamin D , Disp: , Rfl:    naproxen sodium (ALEVE) 220 MG tablet, Take 220 mg by mouth once a week., Disp: , Rfl:    nitroGLYCERIN (NITROSTAT) 0.4 MG SL tablet, Place 0.4 mg under the tongue every 5 (five) minutes as needed for chest pain., Disp: , Rfl:    pantoprazole  (PROTONIX ) 40 MG tablet, Take one tablet by mouth once daily, Disp: 90 tablet, Rfl: 0   PROLIA 60 MG/ML SOSY injection, Inject 60 mg into the skin every 6 (six) months., Disp: , Rfl:    rosuvastatin  (CRESTOR ) 10 MG tablet, TAKE 1 TABLET BY MOUTH EVERY DAY, Disp: 90 tablet, Rfl: 3   SACCHAROMYCES BOULARDII PO, Take 60 mg by mouth daily at 6 (six) AM., Disp: , Rfl:    senna-docusate (SENOKOT-S) 8.6-50 MG tablet, Take 2 tablets by mouth at bedtime. For AFTER surgery, do not take if having diarrhea, Disp: 30 tablet, Rfl: 0   spironolactone (ALDACTONE) 25 MG tablet, Take 25 mg by mouth daily., Disp: , Rfl:    traMADol  (ULTRAM ) 50 MG tablet, Take 1 tablet (50 mg total) by mouth every 6 (six) hours as needed for severe pain (pain score 7-10). For AFTER surgery only, do not take and drive, Disp: 10 tablet, Rfl: 0  Review of Symptoms: Pertinent positives as per HPI.  Physical Exam: Deferred given limitations of phone  visit.  Laboratory & Radiologic Studies: A. UTERUS WITH RIGHT AND LEFT FALLOPIAN TUBE AND OVARY, HYSTERECTOMY AND  BILATERAL SALPINGO-OOPHORECTOMY:  Benign bilateral multiloculated serous cystadenomas  Focal adenomyosis/adenomyoma  Benign inactive endometrium with cystic changes  Chronic cervicitis with squamous metaplasia  Right ovarian serosal adhesions  Benign fallopian tubes  Negative for malignancy   Cytology FINAL MICROSCOPIC DIAGNOSIS:  - No malignant cells identified  - Benign mesothelial cells and chronic inflammatory cells   Assessment & Plan: [name] is a 83 y.o. woman s/p TRH/BSO for bilateral adnexal cysts with torsion of the right ovary, benign pathology.   Doing well, meeting milestones. Discussed bowel regimen.  Discussed findings of significant diverticulosis during surgery.  She will reach out to her GI for follow-up.  Reviewed pathology with her, very happy with this news.  I discussed the assessment and treatment plan with the patient. The patient was provided with an opportunity to ask questions and all were answered. The patient agreed with the plan and demonstrated an understanding of the instructions.   The patient was advised to call back or see an in-person evaluation if the symptoms worsen or if the condition fails to improve as anticipated.   8 minutes of total time was spent for this patient encounter, including preparation, phone counseling with the patient and coordination of care, and documentation of the encounter.   Wiley Hanger, MD  Division of Gynecologic Oncology  Department of Obstetrics and Gynecology  [facility]

## 2023-09-21 ENCOUNTER — Inpatient Hospital Stay: Admitting: Gynecologic Oncology

## 2023-10-02 ENCOUNTER — Telehealth: Payer: Self-pay

## 2023-10-02 DIAGNOSIS — N83201 Unspecified ovarian cyst, right side: Secondary | ICD-10-CM

## 2023-10-02 MED ORDER — SENNOSIDES-DOCUSATE SODIUM 8.6-50 MG PO TABS: 2.0000 | ORAL_TABLET | Freq: Every day | ORAL | 0 refills | Status: DC

## 2023-10-02 NOTE — Telephone Encounter (Signed)
 Refill request for Senexon received from [facility].  I reached out to [name] and she states she picked it up over the counter. Aware to hold if starts having diarrhea. She voiced an understanding

## 2023-10-02 NOTE — Telephone Encounter (Signed)
 [name] called,after last message, and is asking if she can get a refill of Senakot. She states she still has occasional constipation and wants to have it on hand. She was going to pick it up OTC but the mg Is different and she wants to stay on what she has been taking.

## 2023-10-03 NOTE — Progress Notes (Unsigned)
 10/04/2023 [name] [id number] 1940/07/16  Referring provider: Burney Darice CROME, MD Primary GI doctor: Dr. Federico ( Dr. Aneita)  ASSESSMENT AND PLAN:  Constipation with rectal bleeding Colonoscopy 06/2017 good prep diverticulosis 09/02/2023 CT abdomen pelvis with contrast for pelvic pain shows bilateral complex adnexal cystic lesions suspicious for cystic ovarian neoplasms, no evidence of metastatic disease, diverticulosis without diverticulitis Recent laparoscopic total hysterectomy and nephrectomy 09/13/2023 for complex cyst Constipation likely due to pelvic floor dysfunction and reduced activity post-surgery for ovarian cysts. - Recommend Miralax bowel purge with Dulcolax tablets. - Provide samples of Linzess for bowel movement regulation. - Advise against daily use of Senokot to prevent colon dependency. - Provided information on pelvic floor dysfunction and physical therapy. - Monitor for abdominal pain, fevers, or chills; consider CT or antibiotics if symptoms arise. - close follow up 2 months  Rectal fissure Anterior fissures likely due to constipation. Bleeding ceased with improved bowel movements. - Prescribe diltiazem cream for fissure treatment. - Advise that resolving constipation will aid fissure healing.  History of diverticulitis uncomplicated 2020 treated with Augmentin  Colonoscopy 06/2017 good prep diverticulosis No AB pain, fever, chills  GERD with history of grade a esophagitis EGD 01/2019 LA grade a reflux esophagitis, normal stomach nonbleeding duodenal diverticulum no specimens collected Well controlled at this time  History of bilateral complex cyst Normal Ca125  09/13/23: Robotic-assisted laparoscopic total hysterectomy with bilateral salpingo-oophorectomy  Negative for malignancy   Patient Care Team: Burney Darice CROME, MD as PCP - General (Family Medicine) Wonda Sharper, MD as PCP - Cardiology (Cardiology)  HISTORY OF PRESENT ILLNESS: 83 y.o.  female with a past medical history listed below presents for evaluation of constipation and rectal bleeding.   Patient last seen in the office 02/08/2023 by Dr. Aneita for GERD.  Discussed the use of AI scribe software for clinical note transcription with the patient, who gave verbal consent to proceed.  History of Present Illness   [name] is an 83 year old female with diverticulosis who presents with constipation and rectal bleeding.  She has been experiencing constipation since Aug 16, 2023, accompanied by back pain and lower abdominal pain. The constipation was severe, requiring manual disimpaction and the use of Senokot, which was initially ineffective until the dose was increased to two tablets at night and one in the morning. This adjustment improved her bowel movements by the past Sunday. She also tried Miralax without success until recently. There have been no recent changes in medication or routine prior to the onset of constipation, except for occasional use of Tylenol  and naproxen, the latter causing nausea.  Rectal bleeding began around Aug 23, 2023, initially occurring every two to three days and then daily. The bleeding was bright red and in small amounts, without any rectal itching, burning, or discomfort. The bleeding ceased by the past Sunday.  She underwent a total hysterectomy with bilateral oophorectomy on September 13, 2023, due to leaking ovarian cysts. Post-surgery, her abdominal pain has significantly improved, with only mild discomfort in the left lower quadrant. Her last colonoscopy in March 2019 showed diverticulosis, primarily in the sigmoid colon, and a CT scan on Sep 02, 2023, confirmed the presence of diverticulosis. She has a history of a complex bilateral ovarian cyst, which was removed during the recent surgery, and the pathology showed no malignancy.  No urinary issues such as incontinence or frequency, and no recent fevers or chills. Nausea is associated with  naproxen use.  She  reports that she has never smoked. She has never used smokeless tobacco. She reports current alcohol use. She reports that she does not use drugs.  RELEVANT GI HISTORY, IMAGING AND LABS: Results   RADIOLOGY CT scan: Diverticulosis mainly in the sigmoid colon (09/02/2023)  PATHOLOGY No malignancy, presence of squamous cells, no dysplasia (09/13/2023)     EGD Oct. 2020 - LA Grade A reflux esophagitis. - Normal stomach. - Non-bleeding duodenal diverticulum. - No specimens collected  Colonoscopy to cecum, good prep, 06/2017 - Diverticulosis in the sigmoid colon. - The examination was otherwise normal on direct and retroflexion views CBC    Component Value Date/Time   WBC 7.1 09/01/2023 1401   WBC 5.3 11/07/2018 1503   RBC 4.26 09/01/2023 1401   HGB 12.6 09/01/2023 1401   HCT 38.4 09/01/2023 1401   PLT 307 09/01/2023 1401   MCV 90.1 09/01/2023 1401   MCH 29.6 09/01/2023 1401   MCHC 32.8 09/01/2023 1401   RDW 13.6 09/01/2023 1401   LYMPHSABS 1.6 11/07/2018 1503   MONOABS 0.5 11/07/2018 1503   EOSABS 0.1 11/07/2018 1503   BASOSABS 0.0 11/07/2018 1503   Recent Labs    09/01/23 1401  HGB 12.6    CMP     Component Value Date/Time   NA 138 09/01/2023 1401   K 4.6 09/01/2023 1401   CL 105 09/01/2023 1401   CO2 27 09/01/2023 1401   GLUCOSE 120 (H) 09/01/2023 1401   GLUCOSE 96 02/08/2006 1223   BUN 16 09/01/2023 1401   CREATININE 1.01 (H) 09/01/2023 1401   CALCIUM  9.6 09/01/2023 1401   PROT 7.2 09/01/2023 1401   PROT 6.9 03/21/2019 0818   ALBUMIN 4.6 09/01/2023 1401   ALBUMIN 4.8 (H) 03/21/2019 0818   AST 16 09/01/2023 1401   ALT 6 09/01/2023 1401   ALKPHOS 44 09/01/2023 1401   BILITOT 0.3 09/01/2023 1401   BILITOT 0.4 03/21/2019 0818   GFRNONAA 56 (L) 09/01/2023 1401   GFRAA >60 10/22/2018 2012      Latest Ref Rng & Units 09/01/2023    2:01 PM 03/21/2019    8:18 AM 11/07/2018    3:03 PM  Hepatic Function  Total Protein 6.5 - 8.1 g/dL  7.2  6.9  6.8   Albumin 3.5 - 5.0 g/dL 4.6  4.8  4.4   AST 15 - 41 U/L 16  26  15    ALT 0 - 44 U/L 6  10  5    Alk Phosphatase 38 - 126 U/L 44  70  52   Total Bilirubin 0.0 - 1.2 mg/dL 0.3  0.4  0.3   Bilirubin, Direct 0.00 - 0.40 mg/dL  9.89        Current Medications:   Current Outpatient Medications (Endocrine & Metabolic):    PROLIA 60 MG/ML SOSY injection, Inject 60 mg into the skin every 6 (six) months.  Current Outpatient Medications (Cardiovascular):    amLODipine  (NORVASC ) 5 MG tablet, TAKE 1 TABLET (5 MG TOTAL) BY MOUTH DAILY.   nitroGLYCERIN (NITROSTAT) 0.4 MG SL tablet, Place 0.4 mg under the tongue every 5 (five) minutes as needed for chest pain.   rosuvastatin  (CRESTOR ) 10 MG tablet, TAKE 1 TABLET BY MOUTH EVERY DAY   spironolactone (ALDACTONE) 25 MG tablet, Take 25 mg by mouth daily.   Current Outpatient Medications (Analgesics):    acetaminophen  (TYLENOL ) 500 MG tablet, Take 1,000 mg by mouth every 6 (six) hours as needed for mild pain (pain score 1-3) or moderate  pain (pain score 4-6).   aspirin 81 MG tablet, Take 81 mg by mouth daily.   naproxen sodium (ALEVE) 220 MG tablet, Take 220 mg by mouth once a week.   traMADol  (ULTRAM ) 50 MG tablet, Take 1 tablet (50 mg total) by mouth every 6 (six) hours as needed for severe pain (pain score 7-10). For AFTER surgery only, do not take and drive   Current Outpatient Medications (Other):    AMBULATORY NON FORMULARY MEDICATION, Medication Name: Diltiazem 2%/Lidocaine  2%   Using your index finger apply a small amount of medication inside the anal opening and to the external anal area twice daily x 6 weeks.   BLACK CURRANT SEED OIL PO, Take 535 mg by mouth daily.   Calcium  Citrate-Vitamin D  (CITRUS CALCIUM  1500 + D PO), Take 600 mg by mouth daily.   famotidine  (PEPCID ) 40 MG tablet, TAKE 1 TABLET BY MOUTH EVERYDAY AT BEDTIME   GELATIN PO, Take 1,120 mg by mouth daily. 560 mg ea   lidocaine  4 %, Place 1 patch onto the skin daily  as needed (pain).   linaclotide (LINZESS) 145 MCG CAPS capsule, Take 1 capsule daily.   Multiple Vitamin (MULTIVITAMIN) capsule, Take 1 capsule by mouth daily.  Centrum with vitamin D    pantoprazole  (PROTONIX ) 40 MG tablet, Take one tablet by mouth once daily   SACCHAROMYCES BOULARDII PO, Take 60 mg by mouth daily at 6 (six) AM.   senna-docusate (SENOKOT-S) 8.6-50 MG tablet, Take 2 tablets by mouth at bedtime. For AFTER surgery, do not take if having diarrhea  Medical History:  Past Medical History:  Diagnosis Date   Allergy    Anemia    Arthritis    CAD (coronary artery disease)    Dr Rolan   Cancer [facility])    BCC nose   Cataract    both eyes- surgically  removed   Diverticulosis of colon    GERD (gastroesophageal reflux disease)    Herpes zoster    1997 L flank   Hyperlipidemia    intol. to statins in past   Hypertension    Osteopenia    Dr Leva, Gynecology   Osteopenia    Scoliosis    Stenosis (acquired) of bladder neck or vesicourethral orifice    lower back   Allergies:  Allergies  Allergen Reactions   Lisinopril      Diffuse itching w/o rash or fever   Metoprolol     Shortness of breath   Hydrocodone  Nausea And Vomiting   Pravastatin Sodium     REACTION: muscle pain in arms 2009   Oxycodone Nausea And Vomiting     Surgical History:  She  has a past surgical history that includes Varicose vein surgery (1971); Cataract extraction (2010); Coronary angioplasty with stent (06/19/2010); Colonoscopy (2007); Cataract extraction w/ intraocular lens implant (Left, 02/2014); Lumbar disc surgery (11/03/2014); Robotic assisted total hysterectomy with bilateral salpingo oophorectomy (Bilateral, 09/13/2023); and Hysterotomy. Family History:  Her family history includes Asthma in her son; CVA in her paternal grandmother; CVA (age of onset: 82) in her mother; Cancer in her maternal grandmother; Deep vein thrombosis in her paternal grandmother; Diabetes in her paternal  grandmother; Esophageal cancer (age of onset: 66) in her father; Heart attack (age of onset: 66) in her maternal uncle; Heart attack (age of onset: 44) in her father; Heart failure in her mother; Osteoarthritis in her mother; Pancreatic disease in her paternal uncle; Skin cancer in her mother; Stomach cancer in her maternal aunt.  REVIEW  OF SYSTEMS  : All other systems reviewed and negative except where noted in the History of Present Illness.  PHYSICAL EXAM: BP 122/76   Pulse 98   Ht 5' 4 (1.626 m)   Wt 128 lb (58.1 kg)   BMI 21.97 kg/m  Physical Exam   GENERAL APPEARANCE: Well nourished, in no apparent distress. HEENT: No cervical lymphadenopathy, unremarkable thyroid , sclerae anicteric, conjunctiva pink. RESPIRATORY: Respiratory effort normal, breath sounds equal bilaterally without rales, rhonchi, or wheezing. CARDIO: Regular rate and rhythm with no murmurs, rubs, or gallops, peripheral pulses intact. ABDOMEN: Soft, non-distended, active bowel sounds in all four quadrants, no tenderness to palpation, no rebound, no mass appreciated, well-healed. RECTAL: Anterior anal fissures present, internal hemorrhoids present, soft stool in rectum. MUSCULOSKELETAL: Full range of motion, normal gait, without edema. SKIN: Dry, intact without rashes or lesions. No jaundice. NEURO: Alert, oriented, no focal deficits. PSYCH: Cooperative, normal mood and affect.      Alan JONELLE Coombs, PA-C 11:38 AM

## 2023-10-04 ENCOUNTER — Ambulatory Visit: Admitting: Physician Assistant

## 2023-10-04 ENCOUNTER — Encounter: Payer: Self-pay | Admitting: Physician Assistant

## 2023-10-04 VITALS — BP 122/76 | HR 98 | Ht 64.0 in | Wt 128.0 lb

## 2023-10-04 DIAGNOSIS — Z90710 Acquired absence of both cervix and uterus: Secondary | ICD-10-CM

## 2023-10-04 DIAGNOSIS — K5904 Chronic idiopathic constipation: Secondary | ICD-10-CM

## 2023-10-04 DIAGNOSIS — K219 Gastro-esophageal reflux disease without esophagitis: Secondary | ICD-10-CM

## 2023-10-04 DIAGNOSIS — Z8719 Personal history of other diseases of the digestive system: Secondary | ICD-10-CM

## 2023-10-04 DIAGNOSIS — K602 Anal fissure, unspecified: Secondary | ICD-10-CM | POA: Diagnosis not present

## 2023-10-04 DIAGNOSIS — K625 Hemorrhage of anus and rectum: Secondary | ICD-10-CM

## 2023-10-04 DIAGNOSIS — K5900 Constipation, unspecified: Secondary | ICD-10-CM

## 2023-10-04 MED ORDER — LINACLOTIDE 145 MCG PO CAPS: ORAL_CAPSULE | ORAL | 0 refills | Status: DC

## 2023-10-04 MED ORDER — AMBULATORY NON FORMULARY MEDICATION: 1 refills | Status: AC

## 2023-10-04 NOTE — Progress Notes (Signed)
 I agree with the assessment and plan as outlined by Ms. Steffanie Dunn.

## 2023-10-04 NOTE — Patient Instructions (Addendum)
 We have given you samples of the following medication to take: Linzess 145 mcg once daily 30 minutes before breakfast. (After bowel purge)  We have sent a prescription for Diltiazem 2% gel to [facility] for you. Using your index finger, you should apply a small amount of medication inside the rectum up to your first knuckle/joint twice daily x 4 weeks.  [facility]'s information is below: Address: [address], [place], [place] [address]  [phone]  *Please DO NOT go directly from our office to pick up this medication! Give the pharmacy 1 day to process the prescription as this is compounded and takes time to make.   Please do the following: Purchase a bottle of Miralax over the counter as well as a box of 5 mg dulcolax tablets. Take 4 dulcolax tablets. Wait 1 hour. You will then drink 6-8 capfuls of Miralax mixed in an adequate amount of water /juice/gatorade (you may choose which of these liquids to drink) over the next 2-3 hours. You should expect results within 1 to 6 hours after completing the bowel purge. Go to the er if you have severe AB pain, can not pass gas or stool in over 12 hours, can not hold down any food.   Can do enema during this time as well  After the purge can do miralax or linzess   Miralax is an osmotic laxative.  It only brings more water  into the stool.  This is safe to take daily.  Can take up to 17 gram of miralax twice a day.  Mix with juice or coffee.  Start 1 capful at night for 3-4 days and reassess your response in 3-4 days.  You can increase and decrease the dose based on your response.  Remember, it can take up to 3-4 days to take effect OR for the effects to wear off.   I often pair this with benefiber in the morning to help assure the stool is not too loose.    Linzess 145 mcg *IBS-C patients may begin to experience relief from belly pain and overall abdominal symptoms (pain, discomfort, and bloating) in about 1  week,  with symptoms typically improving over 12 weeks.  Take at least 30 minutes before the first meal of the day on an empty stomach You can have a loose stool if you eat a high-fat breakfast. Give it at least 7 days, may have more bowel movements during that time.   The diarrhea should go away and you should start having normal, complete, full bowel movements.  It may be helpful to start treatment when you can be near the comfort of your own bathroom, such as a weekend.  After you are out we can send in a prescription if you did well, there is a prescription card   Thank you for trusting me with your gastrointestinal care!   Alan Coombs, PA-C  _______________________________________________________  If your blood pressure at your visit was 140/90 or greater, please contact your primary care physician to follow up on this.  _______________________________________________________  If you are age 33 or older, your body mass index should be between 23-30. Your Body mass index is 21.97 kg/m. If this is out of the aforementioned range listed, please consider follow up with your Primary Care Provider.  If you are age 68 or younger, your body mass index should be between 19-25. Your Body mass index is 21.97 kg/m. If this is out of the aformentioned range listed, please consider follow  up with your Primary Care Provider.   ________________________________________________________  The [facility] providers would like to encourage you to use MYCHART to communicate with providers for non-urgent requests or questions.  Due to long hold times on the telephone, sending your provider a message by [employer]os may be a faster and more efficient way to get a response.  Please allow 48 business hours for a response.  Please remember that this is for non-urgent requests.  _______________________________________________________   Jacquelene Clever, Adult  A fissure is a linear defect in the anal mucosa,  symptoms include burning, itching, discomfort especially with a bowel movement with associated rectal bleeding.  Risk factors include low fiber diet, chronic constipation and straining. Anal fissures can take a very long time to heal so this will be a 2 to 46-month process.  Treatment for a fissure includes:  -decreasing time in the toilet should not be more than 5 minutes -adding fiber supplement such as Benefiber or Citrucel -increasing water . -I am also going to send in a calcium  channel blocker cream to a compound pharmacy, apply twice daily for 12 weeks. If after 3 months this is not helpful then we will we will refer you to general surgery for evaluation, they can do Botox injections under anesthesia or surgery.  Diltiazem/lidocaine  3 x daily for 2 months sent to compound pharmacy  NTG 1.25% use gloved hand, apply pea size amount every 6-8 hours for pain rectally, #30 gram no refills.    Sent this medication to a compound pharmacy:  [facility] [address], [place], [place] [address]  [phone]   Please DO NOT go directly from our office to pick up this medication! Give the pharmacy 1 day to process the prescription. Extra time is required for them to compound your medication.    Toileting tips to help with your constipation - Drink at least 64-80 ounces of water /liquid per day. - Establish a time to try to move your bowels every day.  For many people, this is after a cup of coffee or after a meal such as breakfast. - Sit all of the way back on the toilet keeping your back fairly straight and while sitting up, try to rest the tops of your forearms on your upper thighs.   - Raising your feet with a step stool/squatty potty can be helpful to improve the angle that allows your stool to pass through the rectum. - Relax the rectum feeling it bulge toward the toilet water .  If you feel your rectum raising toward your body, you are contracting rather than relaxing. -  Breathe in and slowly exhale. Belly breath by expanding your belly towards your belly button. Keep belly expanded as you gently direct pressure down and back to the anus.  A low pitched GRRR sound can assist with increasing intra-abdominal pressure.  (Can also trying to blow on a pinwheel and make it move, this helps with the same belly breathing) - Repeat 3-4 times. If unsuccessful, contract the pelvic floor to restore normal tone and get off the toilet.  Avoid excessive straining. - To reduce excessive wiping by teaching your anus to normally contract, place hands on outer aspect of knees and resist knee movement outward.  Hold 5-10 second then place hands just inside of knees and resist inward movement of knees.  Hold 5 seconds.  Repeat a few times each way.  Go to the ER if unable to pass gas, severe AB pain, unable to hold down  food, any shortness of breath of chest pain.  What is pelvic floor dysfunction?  For most people, having a bowel movement is a seemingly automatic function. For some individuals, the process of evacuating stool may be difficult. Symptoms of pelvic floor dysfunction include constipation and the sensation of incomplete emptying of the rectum when having a bowel movement. Incomplete emptying may result in the individual feeling the need to attempt a bowel movement several times within a short period of time. Residual stool left in the rectum may slowly seep out of the rectum leading to reports of bowel incontinence.  The process of defecation (having a bowel movement) requires the coordinated effort of different muscles. The pelvic floor is made up of several muscles that support the rectum like a hammock. When an individual wants to have a bowel movement the pelvic floor muscles are supposed to relax allowing the rectum to empty. While the pelvic floor muscles are relaxing, muscles of the abdomen contract to help push the stool out of the rectum. Individuals with pelvic floor  dysfunction have a tendency to contract instead of relax the pelvic floor muscles. When this happens during an attempted bowel movement, these individuals are effectively pushing against an unyielding muscular wall.  It can also cause abnormal urination such as incontinence, reduced stream, urgency, etc.  Sexual dysfunction problems in men and women may be related and treatable as well.   How is pelvic floor dysfunction treated?  Pelvic floor dysfunction due to non-relaxation of the pelvic floor muscles may be treated with specialized physical therapy known as biofeedback. With biofeedback, a therapist helps to improve a person's rectal sensation and pelvic floor muscle coordination. There are various effective techniques used in biofeedback. Some therapists train patients by teaching them to expel a small balloon placed in the rectum. Another technique uses a small probe placed in the rectum or vagina or electrodes placed on the surface of the skin around the opening to the rectum (anus) and on the abdominal wall.  These instruments detect when a muscle is contracting or relaxing and provide visual feedback of the muscle action. This visual feedback helps the individual to understand the muscle movement and aids in improving muscle coordination. Approximately 75% of individuals with pelvic floor dysfunction experience significant improvement with biofeedback.  Abnormalities identified with a defecating proctogram such as rectal prolapse and rectocele may be treated with a surgical procedure.

## 2023-10-13 ENCOUNTER — Encounter: Payer: Self-pay | Admitting: Gynecologic Oncology

## 2023-10-13 ENCOUNTER — Inpatient Hospital Stay: Attending: Gynecologic Oncology | Admitting: Gynecologic Oncology

## 2023-10-13 VITALS — BP 131/61 | HR 88 | Temp 97.6°F | Resp 19 | Wt 128.8 lb

## 2023-10-13 DIAGNOSIS — N83201 Unspecified ovarian cyst, right side: Secondary | ICD-10-CM

## 2023-10-13 DIAGNOSIS — N83202 Unspecified ovarian cyst, left side: Secondary | ICD-10-CM

## 2023-10-13 NOTE — Patient Instructions (Signed)
 It was great to see you today.  You are healing very well from surgery!  Please remember, no heavy lifting until 6 weeks after surgery and nothing in the vagina until 12 weeks.  Please do not hesitate to reach out if you need anything in the future.

## 2023-10-13 NOTE — Progress Notes (Signed)
 Gynecologic Oncology Return Clinic Visit  10/13/23  Reason for Visit: follow-up  Treatment History: 12/2018: CT A/P Sigmoid diverticulosis, without evidence of diverticulitis. Chronic bilateral ovarian cystic lesions (5.4 x 4.1 cm right ovarian cyst (series 2/image 65), previously 5.0 x 4.0 cm. 3.6 x 3.0 cm left ovarian cyst (series 2/image 62), previously 3.4 x 2.8 cm.) . Annual follow-up ultrasound is suggested in a postmenopausal patient.  Cysts are noted as chronically stable, similar size on MRI 2015.   [facility] shows a uterus measuring 5.5 x 3.7 x 1.9 cm.  Left ovary measures 6.2 x 3.6 x 6.6 cm and has 2 cysts, one measuring up to 5.7 cm and 1 measuring up to 2.7 cm.  Left ovary measures 5.7 x 2.6 x 3.5 cm.  Left ovarian cyst measures up to 3.4 cm and a second cyst measuring up to 2.8 cm.  Impression is that bilateral ovaries contain simple appearing cyst with minimal change in size from last ultrasound.   CA-125 was performed on 4/25 and normal at 14.1.   Given increased pain, discussed option of surgery and plan was to move forward with definitive surgery.   09/13/23: Robotic-assisted laparoscopic total hysterectomy with bilateral salpingo-oophorectomy   Interval History: Doing very well.  Denies any vaginal bleeding over the last 1.5 weeks.  Denies abdominal pain.  Reports improving bowel function.  Has to stay close to the bathroom when she takes Linzess .  Denies any urinary symptoms.  Past Medical/Surgical History: Past Medical History:  Diagnosis Date   Allergy    Anemia    Arthritis    CAD (coronary artery disease)    Dr Rolan   [facility])    BCC nose   Cataract    both eyes- surgically  removed   Diverticulosis of colon    GERD (gastroesophageal reflux disease)    Herpes zoster    1997 L flank   Hyperlipidemia    intol. to statins in past   Hypertension    Osteopenia    Dr Leva, Gynecology   Osteopenia     Scoliosis    Stenosis (acquired) of bladder neck or vesicourethral orifice    lower back    Past Surgical History:  Procedure Laterality Date   CATARACT EXTRACTION  2010   OD  , Dr. Cleatus   CATARACT EXTRACTION W/ INTRAOCULAR LENS IMPLANT Left 02/2014   Vision not significantly improved   COLONOSCOPY  2007   [facility]   CORONARY ANGIOPLASTY WITH STENT PLACEMENT  06/19/2010   2 vessel; Dr Wonda MOUNTAIN     LUMBAR DISC SURGERY  11/03/2014   ROBOTIC ASSISTED TOTAL HYSTERECTOMY WITH BILATERAL SALPINGO OOPHERECTOMY Bilateral 09/13/2023   Procedure: HYSTERECTOMY, TOTAL, ROBOT-ASSISTED, LAPAROSCOPIC, WITH BILATERAL SALPINGO-OOPHORECTOMY;  Surgeon: Viktoria Comer SAUNDERS, MD;  Location: [facility];  Service: Gynecology;  Laterality: Bilateral;   VARICOSE VEIN SURGERY  1971    Family History  Problem Relation Age of Onset   Osteoarthritis Mother    Skin cancer Mother        ? melanoma   Heart failure Mother    CVA Mother 70   Esophageal cancer Father 78   Heart attack Father 22       smoker   Cancer Maternal Grandmother        ? GI   CVA Paternal Grandmother    Diabetes Paternal Grandmother        prediabetic   Deep vein thrombosis Paternal Grandmother  Asthma Son        EIB   Heart attack Maternal Uncle 37   Pancreatic disease Paternal Uncle        unsure if failure or cancer   Stomach cancer Maternal Aunt    Colon cancer Neg Hx    Colon polyps Neg Hx    Rectal cancer Neg Hx    Breast cancer Neg Hx    Endometrial cancer Neg Hx    Ovarian cancer Neg Hx    Prostate cancer Neg Hx     Social History   Socioeconomic History   Marital status: Married    Spouse name: Not on file   Number of children: 4   Years of education: Not on file   Highest education level: Not on file  Occupational History   Not on file  Tobacco Use   Smoking status: Never   Smokeless tobacco: Never  Vaping Use   Vaping status: Never Used  Substance and Sexual Activity   Alcohol use: Yes     Comment: extremely rarely   Drug use: No   Sexual activity: Not on file  Other Topics Concern   Not on file  Social History Narrative   Exercise: hand weight, crunches, some walking   Social Drivers of Health   Financial Resource Strain: Not on file  Food Insecurity: No Food Insecurity (08/24/2022)   Hunger Vital Sign    Worried About Running Out of Food in the Last Year: Never true    Ran Out of Food in the Last Year: Never true  Transportation Needs: No Transportation Needs (08/24/2022)   PRAPARE - Administrator, Civil Service (Medical): No    Lack of Transportation (Non-Medical): No  Physical Activity: Not on file  Stress: Not on file  Social Connections: Socially Integrated (08/24/2022)   Social Connection and Isolation Panel    Frequency of Communication with Friends and Family: More than three times a week    Frequency of Social Gatherings with Friends and Family: Three times a week    Attends Religious Services: 1 to 4 times per year    Active Member of Clubs or Organizations: Yes    Attends Engineer, structural: More than 4 times per year    Marital Status: Married    Current Medications:  Current Outpatient Medications:    acetaminophen  (TYLENOL ) 500 MG tablet, Take 1,000 mg by mouth every 6 (six) hours as needed for mild pain (pain score 1-3) or moderate pain (pain score 4-6)., Disp: , Rfl:    AMBULATORY NON FORMULARY MEDICATION, Medication Name: Diltiazem 2%/Lidocaine  2%   Using your index finger apply a small amount of medication inside the anal opening and to the external anal area twice daily x 6 weeks., Disp: 30 g, Rfl: 1   amLODipine  (NORVASC ) 5 MG tablet, TAKE 1 TABLET (5 MG TOTAL) BY MOUTH DAILY., Disp: 90 tablet, Rfl: 3   aspirin 81 MG tablet, Take 81 mg by mouth daily., Disp: , Rfl:    BLACK CURRANT SEED OIL PO, Take 535 mg by mouth daily., Disp: , Rfl:    Calcium  Citrate-Vitamin D  (CITRUS CALCIUM  1500 + D PO), Take 600 mg by mouth  daily., Disp: , Rfl:    famotidine  (PEPCID ) 40 MG tablet, TAKE 1 TABLET BY MOUTH EVERYDAY AT BEDTIME, Disp: 90 tablet, Rfl: 3   GELATIN PO, Take 1,120 mg by mouth daily. 560 mg ea, Disp: , Rfl:    lidocaine  4 %, Place  1 patch onto the skin daily as needed (pain)., Disp: , Rfl:    linaclotide  (LINZESS ) 145 MCG CAPS capsule, Take 1 capsule daily., Disp: 12 capsule, Rfl: 0   Multiple Vitamin (MULTIVITAMIN) capsule, Take 1 capsule by mouth daily.  Centrum with vitamin D , Disp: , Rfl:    naproxen sodium (ALEVE) 220 MG tablet, Take 220 mg by mouth once a week., Disp: , Rfl:    nitroGLYCERIN (NITROSTAT) 0.4 MG SL tablet, Place 0.4 mg under the tongue every 5 (five) minutes as needed for chest pain., Disp: , Rfl:    pantoprazole  (PROTONIX ) 40 MG tablet, Take one tablet by mouth once daily, Disp: 90 tablet, Rfl: 0   PROLIA 60 MG/ML SOSY injection, Inject 60 mg into the skin every 6 (six) months., Disp: , Rfl:    rosuvastatin  (CRESTOR ) 10 MG tablet, TAKE 1 TABLET BY MOUTH EVERY DAY, Disp: 90 tablet, Rfl: 3   SACCHAROMYCES BOULARDII PO, Take 60 mg by mouth daily at 6 (six) AM., Disp: , Rfl:    spironolactone (ALDACTONE) 25 MG tablet, Take 25 mg by mouth daily., Disp: , Rfl:    traMADol  (ULTRAM ) 50 MG tablet, Take 1 tablet (50 mg total) by mouth every 6 (six) hours as needed for severe pain (pain score 7-10). For AFTER surgery only, do not take and drive, Disp: 10 tablet, Rfl: 0   senna-docusate (SENOKOT-S) 8.6-50 MG tablet, Take 2 tablets by mouth at bedtime. For AFTER surgery, do not take if having diarrhea, Disp: 30 tablet, Rfl: 0  Review of Systems: Denies appetite changes, fevers, chills, fatigue, unexplained weight changes. Denies hearing loss, neck lumps or masses, mouth sores, ringing in ears or voice changes. Denies cough or wheezing.  Denies shortness of breath. Denies chest pain or palpitations. Denies leg swelling. Denies abdominal distention, pain, blood in stools, diarrhea, nausea, vomiting,  or early satiety. Denies pain with intercourse, dysuria, frequency, hematuria or incontinence. Denies hot flashes, pelvic pain, vaginal bleeding or vaginal discharge.   Denies joint pain, back pain or muscle pain/cramps. Denies itching, rash, or wounds. Denies dizziness, headaches, numbness or seizures. Denies swollen lymph nodes or glands, denies easy bruising or bleeding. Denies anxiety, depression, confusion, or decreased concentration.  Physical Exam: BP 131/61 (BP Location: Left Arm, Patient Position: Sitting)   Pulse 88   Temp 97.6 F (36.4 C) (Oral)   Resp 19   Wt 128 lb 12.8 oz (58.4 kg)   SpO2 97%   BMI 22.11 kg/m  General: Alert, oriented, no acute distress. HEENT: Posterior oropharynx clear, sclera anicteric. Chest: Unlabored breathing on room air. Abdomen: soft, nontender.  Normoactive bowel sounds.  No masses or hepatosplenomegaly appreciated.  Well-healed incisions. Extremities: Grossly normal range of motion.  Warm, well perfused.  No edema bilaterally. GU: Normal appearing external genitalia without erythema, excoriation, or lesions.  Speculum exam reveals cuff intact, suture visible.  Bimanual exam reveals cuff intact, no fluctuance or tenderness to palpation.    Laboratory & Radiologic Studies: A. UTERUS WITH RIGHT AND LEFT FALLOPIAN TUBE AND OVARY, HYSTERECTOMY AND  BILATERAL SALPINGO-OOPHORECTOMY:  Benign bilateral multiloculated serous cystadenomas  Focal adenomyosis/adenomyoma  Benign inactive endometrium with cystic changes  Chronic cervicitis with squamous metaplasia  Right ovarian serosal adhesions  Benign fallopian tubes  Negative for malignancy    Cytology FINAL MICROSCOPIC DIAGNOSIS:  - No malignant cells identified  - Benign mesothelial cells and chronic inflammatory cells   Assessment & Plan: KERIANNE GURR is a 83 y.o. woman s/p TRH/BSO for bilateral  adnexal cysts with torsion of the right ovary, benign pathology.   Patient is doing very well  after surgery.  Discussed continued expectations or restrictions.  Reviewed pathology with her.  She was given a copy of her pathology report.  She will be discharged back to her other providers.  18 minutes of total time was spent for this patient encounter, including preparation, face-to-face counseling with the patient and coordination of care, and documentation of the encounter.  Comer Dollar, MD  Division of Gynecologic Oncology  Department of Obstetrics and Gynecology  [facility]  Hospitals

## 2023-12-05 ENCOUNTER — Ambulatory Visit: Admitting: Physician Assistant

## 2023-12-05 ENCOUNTER — Encounter: Payer: Self-pay | Admitting: Physician Assistant

## 2023-12-05 VITALS — BP 116/72 | HR 83 | Ht 63.0 in | Wt 130.5 lb

## 2023-12-05 DIAGNOSIS — K5904 Chronic idiopathic constipation: Secondary | ICD-10-CM | POA: Diagnosis not present

## 2023-12-05 DIAGNOSIS — K5730 Diverticulosis of large intestine without perforation or abscess without bleeding: Secondary | ICD-10-CM

## 2023-12-05 DIAGNOSIS — K602 Anal fissure, unspecified: Secondary | ICD-10-CM

## 2023-12-05 DIAGNOSIS — K219 Gastro-esophageal reflux disease without esophagitis: Secondary | ICD-10-CM

## 2023-12-05 NOTE — Progress Notes (Signed)
 12/05/2023 [name] [id number] 1941/02/01  Referring provider: Burney Darice CROME, MD Primary GI doctor: Dr. Federico ( Dr. Aneita)  ASSESSMENT AND PLAN:   GERD with history of grade a esophagitis complaining pressure/pain in lower esophagus for a year, increasing in symptoms, regurg of food/acid at night, no dysphagia, no melena Rare to no NSAIDS ( stopped aleve 3 months ago), rare ETOH once a month, no tobacco use EGD 01/2019 LA grade a reflux esophagitis, normal stomach nonbleeding duodenal diverticulum no specimens collected On pantoprazole  40 mg once daily, worsening in the evenings, worse after food - continue pantroprazole 40  -alginate therapy given -Lifestyle changes discussed, avoid NSAIDS, ETOH, hand out given to the patient -Schedule EGD at [facility] to evaluate GERD, esophagitis, hiatal hernia,H pylori. I discussed risks of EGD with patient today, including risk of sedation, bleeding or perforation. Patient provides understanding and gave verbal consent to proceed.  Constipation with rectal bleeding, has improved with fiber/miralax but has stopped miralax No further bleeding Colonoscopy 06/2017 good prep diverticulosis 09/02/2023 CT abdomen pelvis with contrast for pelvic pain shows bilateral complex adnexal cystic lesions suspicious for cystic ovarian neoplasms, no evidence of metastatic disease, diverticulosis without diverticulitis Recent laparoscopic total hysterectomy and nephrectomy 09/13/2023 for complex cyst Constipation likely due to pelvic floor dysfunction and reduced activity post-surgery for ovarian cysts. - add on miralax , consider linzess  - Provided information on pelvic floor dysfunction and physical therapy. - consider CT  Rectal fissure Anterior fissures likely due to constipation.  Treated with diltiazem and this has healed - start back on miralax  History of diverticulitis uncomplicated 2020 treated with Augmentin  Colonoscopy 06/2017 good prep  diverticulosis No AB pain, fever, chills  History of bilateral complex cyst Normal Ca125  09/13/23: Robotic-assisted laparoscopic total hysterectomy with bilateral salpingo-oophorectomy  Negative for malignancy   Patient Care Team: Burney Darice CROME, MD as PCP - General (Family Medicine) Wonda Sharper, MD as PCP - Cardiology (Cardiology)  HISTORY OF PRESENT ILLNESS: 83 y.o. female with a past medical history listed below presents for evaluation of constipation and rectal bleeding.   Patient last seen in the office by myself 10/04/2023 for constipation post surgery.   Discussed the use of AI scribe software for clinical note transcription with the patient, who gave verbal consent to proceed.  History of Present Illness   [name] is an 83 year old female who presents for follow-up of constipation and esophageal discomfort.  She has experienced pressure and discomfort in her lower esophagus for about a year, occurring four to five times a week. Initially, pantoprazole  improved her symptoms, but recently, discomfort has increased, especially in the afternoon and evening. She takes pantoprazole  once daily in the morning and finds relief with liquids after two to three hours. No food or pills get caught, but GERD symptoms occur twice a month, with regurgitation at night.  She had an endoscopy in November 2020. Her family history includes her father who died of esophageal cancer and a brother with polyps removed from his vocal cords and esophagus.  Regarding constipation, she was previously treated with a Miralax purge and Linzess  samples. She uses a cream for a rectal fissure once daily, which she believes helps alleviate constipation. Her bowel movements are more constipated than desired, but she considers this normal. No bleeding for a couple of months. She is not currently using Linzess  or Miralax, but has used Miralax in the past after childbirth.  She denies using Aleve, ibuprofen,  or Goody powders, and primarily uses Tylenol . She stopped taking Aleve three months ago, which she used infrequently. She does not smoke and consumes alcohol casually, about once a month.  No dark black stools. Occasional GERD symptoms with regurgitation occurring twice a month. No food or pills getting caught in her esophagus.      She  reports that she has never smoked. She has never used smokeless tobacco. She reports current alcohol use. She reports that she does not use drugs.  RELEVANT GI HISTORY, IMAGING AND LABS: Results   DIAGNOSTIC EGD: Esophagitis and small intestine diverticulum (02/2019)     EGD Oct. 2020 - LA Grade A reflux esophagitis. - Normal stomach. - Non-bleeding duodenal diverticulum. - No specimens collected  Colonoscopy to cecum, good prep, 06/2017 - Diverticulosis in the sigmoid colon. - The examination was otherwise normal on direct and retroflexion views CBC    Component Value Date/Time   WBC 7.1 09/01/2023 1401   WBC 5.3 11/07/2018 1503   RBC 4.26 09/01/2023 1401   HGB 12.6 09/01/2023 1401   HCT 38.4 09/01/2023 1401   PLT 307 09/01/2023 1401   MCV 90.1 09/01/2023 1401   MCH 29.6 09/01/2023 1401   MCHC 32.8 09/01/2023 1401   RDW 13.6 09/01/2023 1401   LYMPHSABS 1.6 11/07/2018 1503   MONOABS 0.5 11/07/2018 1503   EOSABS 0.1 11/07/2018 1503   BASOSABS 0.0 11/07/2018 1503   Recent Labs    09/01/23 1401  HGB 12.6    CMP     Component Value Date/Time   NA 138 09/01/2023 1401   K 4.6 09/01/2023 1401   CL 105 09/01/2023 1401   CO2 27 09/01/2023 1401   GLUCOSE 120 (H) 09/01/2023 1401   GLUCOSE 96 02/08/2006 1223   BUN 16 09/01/2023 1401   CREATININE 1.01 (H) 09/01/2023 1401   CALCIUM  9.6 09/01/2023 1401   PROT 7.2 09/01/2023 1401   PROT 6.9 03/21/2019 0818   ALBUMIN 4.6 09/01/2023 1401   ALBUMIN 4.8 (H) 03/21/2019 0818   AST 16 09/01/2023 1401   ALT 6 09/01/2023 1401   ALKPHOS 44 09/01/2023 1401   BILITOT 0.3 09/01/2023 1401   BILITOT  0.4 03/21/2019 0818   GFRNONAA 56 (L) 09/01/2023 1401   GFRAA >60 10/22/2018 2012      Latest Ref Rng & Units 09/01/2023    2:01 PM 03/21/2019    8:18 AM 11/07/2018    3:03 PM  Hepatic Function  Total Protein 6.5 - 8.1 g/dL 7.2  6.9  6.8   Albumin 3.5 - 5.0 g/dL 4.6  4.8  4.4   AST 15 - 41 U/L 16  26  15    ALT 0 - 44 U/L 6  10  5    Alk Phosphatase 38 - 126 U/L 44  70  52   Total Bilirubin 0.0 - 1.2 mg/dL 0.3  0.4  0.3   Bilirubin, Direct 0.00 - 0.40 mg/dL  9.89        Current Medications:   Current Outpatient Medications (Endocrine & Metabolic):    PROLIA 60 MG/ML SOSY injection, Inject 60 mg into the skin every 6 (six) months.  Current Outpatient Medications (Cardiovascular):    amLODipine  (NORVASC ) 5 MG tablet, TAKE 1 TABLET (5 MG TOTAL) BY MOUTH DAILY.   nitroGLYCERIN (NITROSTAT) 0.4 MG SL tablet, Place 0.4 mg under the tongue every 5 (five) minutes as needed for chest pain.   rosuvastatin  (CRESTOR ) 10 MG tablet, TAKE 1 TABLET BY MOUTH EVERY DAY  spironolactone (ALDACTONE) 25 MG tablet, Take 25 mg by mouth daily.   Current Outpatient Medications (Analgesics):    acetaminophen  (TYLENOL ) 500 MG tablet, Take 1,000 mg by mouth every 6 (six) hours as needed for mild pain (pain score 1-3) or moderate pain (pain score 4-6).   aspirin 81 MG tablet, Take 81 mg by mouth daily.   naproxen sodium (ALEVE) 220 MG tablet, Take 220 mg by mouth once a week.   traMADol  (ULTRAM ) 50 MG tablet, Take 1 tablet (50 mg total) by mouth every 6 (six) hours as needed for severe pain (pain score 7-10). For AFTER surgery only, do not take and drive   Current Outpatient Medications (Other):    AMBULATORY NON FORMULARY MEDICATION, Medication Name: Diltiazem 2%/Lidocaine  2%   Using your index finger apply a small amount of medication inside the anal opening and to the external anal area twice daily x 6 weeks.   BLACK CURRANT SEED OIL PO, Take 535 mg by mouth daily.   Calcium  Citrate-Vitamin D  (CITRUS  CALCIUM  1500 + D PO), Take 600 mg by mouth daily.   famotidine  (PEPCID ) 40 MG tablet, TAKE 1 TABLET BY MOUTH EVERYDAY AT BEDTIME   GELATIN PO, Take 1,120 mg by mouth daily. 560 mg ea   lidocaine  4 %, Place 1 patch onto the skin daily as needed (pain).   Multiple Vitamin (MULTIVITAMIN) capsule, Take 1 capsule by mouth daily.  Centrum with vitamin D    pantoprazole  (PROTONIX ) 40 MG tablet, Take one tablet by mouth once daily   SACCHAROMYCES BOULARDII PO, Take 60 mg by mouth daily at 6 (six) AM.  Medical History:  Past Medical History:  Diagnosis Date   Allergy    Anemia    Arthritis    CAD (coronary artery disease)    Dr Rolan   Cancer [facility])    BCC nose   Cataract    both eyes- surgically  removed   Diverticulosis of colon    GERD (gastroesophageal reflux disease)    Herpes zoster    1997 L flank   Hyperlipidemia    intol. to statins in past   Hypertension    Osteopenia    Dr Leva, Gynecology   Osteopenia    Scoliosis    Stenosis (acquired) of bladder neck or vesicourethral orifice    lower back   Allergies:  Allergies  Allergen Reactions   Lisinopril      Diffuse itching w/o rash or fever   Metoprolol     Shortness of breath   Hydrocodone  Nausea And Vomiting   Pravastatin Sodium     REACTION: muscle pain in arms 2009   Oxycodone Nausea And Vomiting     Surgical History:  She  has a past surgical history that includes Varicose vein surgery (1971); Cataract extraction (2010); Coronary angioplasty with stent (06/19/2010); Colonoscopy (2007); Cataract extraction w/ intraocular lens implant (Left, 02/2014); Lumbar disc surgery (11/03/2014); Robotic assisted total hysterectomy with bilateral salpingo oophorectomy (Bilateral, 09/13/2023); and Hysterotomy (2025). Family History:  Her family history includes Asthma in her son; CVA in her paternal grandmother; CVA (age of onset: 71) in her mother; Cancer in her maternal grandmother; Deep vein thrombosis in her paternal  grandmother; Diabetes in her paternal grandmother; Esophageal cancer (age of onset: 31) in her father; Heart attack (age of onset: 86) in her maternal uncle; Heart attack (age of onset: 30) in her father; Heart failure in her mother; Osteoarthritis in her mother; Pancreatic disease in her paternal uncle; Skin cancer in  her mother; Stomach cancer in her maternal aunt.  REVIEW OF SYSTEMS  : All other systems reviewed and negative except where noted in the History of Present Illness.  PHYSICAL EXAM: BP 116/72   Pulse 83   Ht 5' 3 (1.6 m)   Wt 130 lb 8 oz (59.2 kg)   BMI 23.12 kg/m  Physical Exam   GENERAL APPEARANCE: Well nourished, in no apparent distress. HEENT: No cervical lymphadenopathy, unremarkable thyroid , sclerae anicteric, conjunctiva pink. RESPIRATORY: Respiratory effort normal, breath sounds equal bilaterally without rales, rhonchi, or wheezing. CARDIO: Regular rate and rhythm with no murmurs, rubs, or gallops, peripheral pulses intact. ABDOMEN: Soft, non-distended, active bowel sounds in all four quadrants, mild lower abdominal discomfort on palpation, no rebound, no mass appreciated. RECTAL: Declines. MUSCULOSKELETAL: Full range of motion, normal gait, without edema. SKIN: Dry, intact without rashes or lesions. No jaundice. NEURO: Alert, oriented, no focal deficits. PSYCH: Cooperative, normal mood and affect.      Alan JONELLE Coombs, PA-C 11:24 AM

## 2023-12-05 NOTE — Patient Instructions (Addendum)
 Miralax is an osmotic laxative.  It only brings more water  into the stool.  This is safe to take daily.  Can take up to 17 gram of miralax twice a day.  Mix with juice or coffee.  Start 1/2-1  capful  3-4 days and reassess your response in 3-4 days.  You can increase and decrease the dose based on your response.  Remember, it can take up to 3-4 days to take effect OR for the effects to wear off.   I often pair this with benefiber in the morning to help assure the stool is not too loose.   FIBER SUPPLEMENT You can do metamucil or fibercon once or twice a day but if this causes gas/bloating please switch to Benefiber or Citracel.  Fiber is good for constipation/diarrhea/irritable bowel syndrome.  It can also help with weight loss and can help lower your bad cholesterol (LDL).  Please do 1 TBSP in the morning in water , coffee, or tea.  It can take up to a month before you can see a difference with your bowel movements.  It is cheapest from [employer], [employer], [employer].   Reflux Gourmet Rescue  It is an ALGINATE THERAPY which is the only intervention that works to safeguard the esophagus by creating a protective barrier that actually stops reflux from happening. -The general directions for use are as stated on the packaging: Take 1 teaspoon (5 ml), or more as needed or as directed by your physician, after meals and before bed. -These general directions address the most common times for reflux to occur, but our Rescue products may be taken anytime. Some individuals may take our product preemptively, when they know they will suffer from reflux, or as needed - when discomfort arises. (If taken around food, it should be consumed last.) -You do not have to take 1 teaspoon (5 ml) of the product. While one teaspoon (5ml) may be the perfect average amount to relieve reflux suffering in some, others may require more or less. You may adjust the amount of Mint Chocolate Rescue and Vanilla Caramel Rescue to the  lowest amount necessary to meet your individual needs to improve your quality of life. -You may dilute the product if it is too viscous for you to consume. Keep in mind, however, that the thickness of the product was formulated to provide optimal coating and protection of your throat and esophagus. Though diluting the product is possible, it may reduce the protective function and/or length of action. -This can be used in conjunction with reflux medications and lifestyle changes.  100% ALL-NATURAL  Paraben FREE, glycerin FREE, & potassium FREE  Made entirely from all-natural ingredients considered safe for children and during pregnancy  No known side effects  All-natural flavor Gluten FREE  Allergen FREE  Vegan  Can find more information here: NameSeizer.co.nz  Please take your proton pump inhibitor medication, pantoprazole  Please take this medication 30 minutes to 1 hour before meals- this makes it more effective.  Avoid spicy and acidic foods Avoid fatty foods Limit your intake of coffee, tea, alcohol, and carbonated drinks Work to maintain a healthy weight Keep the head of the bed elevated at least 3 inches with blocks or a wedge pillow if you are having any nighttime symptoms Stay upright for 2 hours after eating Avoid meals and snacks three to four hours before bedtime.  You have been scheduled for an endoscopy. Please follow written instructions given to you at your visit today.  If you use inhalers (  even only as needed), please bring them with you on the day of your procedure.  If you take any of the following medications, they will need to be adjusted prior to your procedure:   DO NOT TAKE 7 DAYS PRIOR TO TEST- Trulicity (dulaglutide) Ozempic, Wegovy (semaglutide) Mounjaro (tirzepatide) Bydureon Bcise (exanatide extended release)  DO NOT TAKE 1 DAY PRIOR TO YOUR TEST Rybelsus (semaglutide) Adlyxin (lixisenatide) Victoza  (liraglutide) Byetta (exanatide) ___________________________________________________________________________   Thank you for trusting me with your gastrointestinal care!   Alan Coombs, PA-C   _______________________________________________________  If your blood pressure at your visit was 140/90 or greater, please contact your primary care physician to follow up on this.  _______________________________________________________  If you are age 67 or older, your body mass index should be between 23-30. Your Body mass index is 23.12 kg/m. If this is out of the aforementioned range listed, please consider follow up with your Primary Care Provider.  If you are age 89 or younger, your body mass index should be between 19-25. Your Body mass index is 23.12 kg/m. If this is out of the aformentioned range listed, please consider follow up with your Primary Care Provider.   ________________________________________________________  The [facility] providers would like to encourage you to use MYCHART to communicate with providers for non-urgent requests or questions.  Due to long hold times on the telephone, sending your provider a message by [facility] may be a faster and more efficient way to get a response.  Please allow 48 business hours for a response.  Please remember that this is for non-urgent requests.  _______________________________________________________  [facility] is using a team-based approach to care.  Your team is made up of your doctor and two to three APPS. Our APPS (Nurse Practitioners and Physician Assistants) work with your physician to ensure care continuity for you. They are fully qualified to address your health concerns and develop a treatment plan. They communicate directly with your gastroenterologist to care for you. Seeing the Advanced Practice Practitioners on your physician's team can help you by facilitating care more promptly, often allowing for earlier  appointments, access to diagnostic testing, procedures, and other specialty referrals.

## 2023-12-05 NOTE — Progress Notes (Signed)
 I agree with the assessment and plan as outlined by Ms. Craig.

## 2023-12-07 ENCOUNTER — Telehealth: Payer: Self-pay | Admitting: Family Medicine

## 2023-12-07 DIAGNOSIS — M810 Age-related osteoporosis without current pathological fracture: Secondary | ICD-10-CM

## 2023-12-07 NOTE — Telephone Encounter (Signed)
 Pt called, due for Prolia in October.  Can we run benefits and schedule?  States she may need labs, had issues with kidney function related to Prolia in past and states [facility] was monitoring. Pt advised her PCP has recently done labs so unsure if this would work.  Also due for Bone Density, plans to continue having this done at GYN if that is OK.

## 2023-12-07 NOTE — Telephone Encounter (Signed)
 Forwarding to Dr. Joane to review and advise (see pt questions below).  CMP 09/01/23:           Component Ref Range & Units (hover) 3 mo ago (09/01/23) 4 yr ago (03/21/19) 5 yr ago (11/07/18) 5 yr ago (10/22/18) 5 yr ago (10/15/18) 6 yr ago (08/10/17) 7 yr ago (08/08/16)  Sodium 138  140 R 138 135 141 R 139 R  Potassium 4.6  3.7 R 4.0 3.3 Low  5.2 High  R 4.6 R  Chloride 105  107 R 103 102 107 R 106 R  CO2 27  26 R 23 23 27  R 26 R  Glucose, Bld 120 High   105 High  134 High  106 High  99 97  Comment: Glucose reference range applies only to samples taken after fasting for at least 8 hours.  BUN 16  10 R 18 16 12  R 13 R  Creatinine, Ser 1.01 High   0.77 R 0.85 0.86 0.93 R 0.89 R  Calcium  9.6  9.2 R 9.2 9.3 9.7 R 9.5 R  Total Protein 7.2 6.9 R 6.8 R 7.3  7.0 R 7.2 R  Albumin 4.6 4.8 High  R 4.4 R 4.4  4.6 R 4.6 R  AST 16 26 R 15 R 24  19 R 20 R  ALT 6 10 R 5 R 10  8 R 8 R  Alkaline Phosphatase 44 70 R 52 R 60  55 R 53 R  Total Bilirubin 0.3 0.4 0.3 R 0.7 R  0.6 R 0.5 R  GFR, Estimated 56 Low         Comment: (NOTE) Calculated using the CKD-EPI Creatinine Equation (2021)  Anion gap 6   12 CM 10 CM    Comment: Performed at [facility], [address], [place], [place] [address]     Per visit note 05/18/23:   Assessment and Plan: 83 y.o. female with osteoporosis.  She did get started on Prolia but her second dose was held as her GFR was less than 60.  Prolia can be given with GFR greater than 30.  Apparently her OB/GYN office already purchased and she already paid for the copayment for Prolia.  It is okay to give that Prolia now.  Happy to take over Prolia in the future if that is what her OB/GYN desires.   Go ahead and check vitamin D  today but labs obtained by OB/GYN office recently are okay for Prolia.

## 2023-12-07 NOTE — Addendum Note (Signed)
 Addended by: MARDY LEOTIS RAMAN on: 12/07/2023 01:58 PM   Modules accepted: Orders

## 2023-12-07 NOTE — Telephone Encounter (Signed)
 Lets get labs in preparation for Prolia

## 2023-12-11 ENCOUNTER — Other Ambulatory Visit (INDEPENDENT_AMBULATORY_CARE_PROVIDER_SITE_OTHER)

## 2023-12-11 DIAGNOSIS — M810 Age-related osteoporosis without current pathological fracture: Secondary | ICD-10-CM

## 2023-12-11 LAB — COMPREHENSIVE METABOLIC PANEL WITH GFR
ALT: 6 U/L (ref 0–35)
AST: 16 U/L (ref 0–37)
Albumin: 4.4 g/dL (ref 3.5–5.2)
Alkaline Phosphatase: 36 U/L — ABNORMAL LOW (ref 39–117)
BUN: 18 mg/dL (ref 6–23)
CO2: 24 meq/L (ref 19–32)
Calcium: 8.7 mg/dL (ref 8.4–10.5)
Chloride: 107 meq/L (ref 96–112)
Creatinine, Ser: 1.06 mg/dL (ref 0.40–1.20)
GFR: 48.97 mL/min — ABNORMAL LOW (ref >60.00)
Glucose, Bld: 94 mg/dL (ref 70–99)
Potassium: 4.3 meq/L (ref 3.5–5.1)
Sodium: 138 meq/L (ref 135–145)
Total Bilirubin: 0.5 mg/dL (ref 0.2–1.2)
Total Protein: 7.1 g/dL (ref 6.0–8.3)

## 2023-12-11 LAB — PHOSPHORUS: Phosphorus: 2.9 mg/dL (ref 2.3–4.6)

## 2023-12-11 LAB — MAGNESIUM: Magnesium: 2.3 mg/dL (ref 1.5–2.5)

## 2023-12-11 LAB — VITAMIN D 25 HYDROXY (VIT D DEFICIENCY, FRACTURES): VITD: 37.05 ng/mL (ref 30.00–100.00)

## 2023-12-12 ENCOUNTER — Ambulatory Visit: Payer: Self-pay | Admitting: Family Medicine

## 2023-12-12 ENCOUNTER — Encounter: Payer: Self-pay | Admitting: Family Medicine

## 2023-12-12 NOTE — Progress Notes (Signed)
 Osteoporosis labs look okay.  Okay to proceed to Prolia.

## 2023-12-28 ENCOUNTER — Encounter: Payer: Self-pay | Admitting: Pediatrics

## 2023-12-29 ENCOUNTER — Other Ambulatory Visit: Payer: Self-pay | Admitting: Medical Genetics

## 2023-12-31 NOTE — Progress Notes (Unsigned)
 [facility] History and Physical   Primary Care Physician:  Burney Darice CROME, MD   Reason for Procedure:  GERD, regurgitation, lower esophageal pressure despite PPI, history of LA grade A esophagitis and family history of esophageal cancer  Plan:    EGD     HPI: [name] is a 83 y.o. female undergoing EGD for investigation of GERD, regurgitation, lower esophageal pressure despite PPI, history of LA grade a esophagitis and family history of esophageal cancer.  Patient has been taking pantoprazole  40 mg orally daily but reports pressure in lower esophagus for the last year.  Endorses regurgitation of food and acid at night but no dysphagia or melena.  Last upper endoscopy was performed in 2020 disclosing LA grade A esophagitis.  Chart history documents that the patient's father had esophageal cancer.   Past Medical History:  Diagnosis Date   Allergy    Anemia    Arthritis    CAD (coronary artery disease)    Dr Rolan   Cancer [facility])    BCC nose   Cataract    both eyes- surgically  removed   Diverticulosis of colon    GERD (gastroesophageal reflux disease)    Herpes zoster    1997 L flank   Hyperlipidemia    intol. to statins in past   Hypertension    Osteopenia    Dr Leva, Gynecology   Osteopenia    Scoliosis    Stenosis (acquired) of bladder neck or vesicourethral orifice    lower back    Past Surgical History:  Procedure Laterality Date   CATARACT EXTRACTION  2010   OD  , Dr. Cleatus   CATARACT EXTRACTION W/ INTRAOCULAR LENS IMPLANT Left 02/2014   Vision not significantly improved   COLONOSCOPY  2007   [facility]   CORONARY ANGIOPLASTY WITH STENT PLACEMENT  06/19/2010   2 vessel; Dr Wonda   HYSTEROTOMY  2025   LUMBAR DISC SURGERY  11/03/2014   ROBOTIC ASSISTED TOTAL HYSTERECTOMY WITH BILATERAL SALPINGO OOPHERECTOMY Bilateral 09/13/2023   Procedure: HYSTERECTOMY, TOTAL, ROBOT-ASSISTED, LAPAROSCOPIC, WITH BILATERAL SALPINGO-OOPHORECTOMY;   Surgeon: Viktoria Comer SAUNDERS, MD;  Location: [facility];  Service: Gynecology;  Laterality: Bilateral;   VARICOSE VEIN SURGERY  1971    Prior to Admission medications   Medication Sig Start Date End Date Taking? Authorizing Provider  acetaminophen  (TYLENOL ) 500 MG tablet Take 1,000 mg by mouth every 6 (six) hours as needed for mild pain (pain score 1-3) or moderate pain (pain score 4-6).    [provider]  AMBULATORY NON FORMULARY MEDICATION Medication Name: Diltiazem 2%/Lidocaine  2%   Using your index finger apply a small amount of medication inside the anal opening and to the external anal area twice daily x 6 weeks. 10/04/23   Craig Alan SAUNDERS, PA-C  amLODipine  (NORVASC ) 5 MG tablet TAKE 1 TABLET (5 MG TOTAL) BY MOUTH DAILY. 05/31/23   Wonda Sharper, MD  aspirin 81 MG tablet Take 81 mg by mouth daily.    [provider]  BLACK CURRANT SEED OIL PO Take 535 mg by mouth daily.    [provider]  Calcium  Citrate-Vitamin D  (CITRUS CALCIUM  1500 + D PO) Take 600 mg by mouth daily.    [provider]  famotidine  (PEPCID ) 40 MG tablet TAKE 1 TABLET BY MOUTH EVERYDAY AT BEDTIME 03/06/23   Aneita Gwendlyn DASEN, MD  GELATIN PO Take 1,120 mg by mouth daily. 560 mg ea    [provider]  lidocaine  4 %  Place 1 patch onto the skin daily as needed (pain).    [provider]  Multiple Vitamin (MULTIVITAMIN) capsule Take 1 capsule by mouth daily.  Centrum with vitamin D     [provider]  naproxen sodium (ALEVE) 220 MG tablet Take 220 mg by mouth once a week.    [provider]  nitroGLYCERIN (NITROSTAT) 0.4 MG SL tablet Place 0.4 mg under the tongue every 5 (five) minutes as needed for chest pain.    [provider]  pantoprazole  (PROTONIX ) 40 MG tablet Take one tablet by mouth once daily 08/24/23   Earnstine Meinders, Inocente HERO, MD  PROLIA 60 MG/ML SOSY injection Inject 60 mg into the skin every 6 (six) months. 07/21/22   [provider]   rosuvastatin  (CRESTOR ) 10 MG tablet TAKE 1 TABLET BY MOUTH EVERY DAY 05/31/23   Wonda Sharper, MD  SACCHAROMYCES BOULARDII PO Take 60 mg by mouth daily at 6 (six) AM.    [provider]  spironolactone (ALDACTONE) 25 MG tablet Take 25 mg by mouth daily.    [provider]  traMADol  (ULTRAM ) 50 MG tablet Take 1 tablet (50 mg total) by mouth every 6 (six) hours as needed for severe pain (pain score 7-10). For AFTER surgery only, do not take and drive 3/89/74   Cross, Melissa D, NP    Current Outpatient Medications  Medication Sig Dispense Refill   acetaminophen  (TYLENOL ) 500 MG tablet Take 1,000 mg by mouth every 6 (six) hours as needed for mild pain (pain score 1-3) or moderate pain (pain score 4-6).     AMBULATORY NON FORMULARY MEDICATION Medication Name: Diltiazem 2%/Lidocaine  2%   Using your index finger apply a small amount of medication inside the anal opening and to the external anal area twice daily x 6 weeks. 30 g 1   amLODipine  (NORVASC ) 5 MG tablet TAKE 1 TABLET (5 MG TOTAL) BY MOUTH DAILY. 90 tablet 3   aspirin 81 MG tablet Take 81 mg by mouth daily.     BLACK CURRANT SEED OIL PO Take 535 mg by mouth daily.     Calcium  Citrate-Vitamin D  (CITRUS CALCIUM  1500 + D PO) Take 600 mg by mouth daily.     famotidine  (PEPCID ) 40 MG tablet TAKE 1 TABLET BY MOUTH EVERYDAY AT BEDTIME 90 tablet 3   GELATIN PO Take 1,120 mg by mouth daily. 560 mg ea     lidocaine  4 % Place 1 patch onto the skin daily as needed (pain).     Multiple Vitamin (MULTIVITAMIN) capsule Take 1 capsule by mouth daily.  Centrum with vitamin D      naproxen sodium (ALEVE) 220 MG tablet Take 220 mg by mouth once a week.     nitroGLYCERIN (NITROSTAT) 0.4 MG SL tablet Place 0.4 mg under the tongue every 5 (five) minutes as needed for chest pain.     pantoprazole  (PROTONIX ) 40 MG tablet Take one tablet by mouth once daily 90 tablet 0   PROLIA 60 MG/ML SOSY injection Inject 60 mg into the skin every 6 (six)  months.     rosuvastatin  (CRESTOR ) 10 MG tablet TAKE 1 TABLET BY MOUTH EVERY DAY 90 tablet 3   SACCHAROMYCES BOULARDII PO Take 60 mg by mouth daily at 6 (six) AM.     spironolactone (ALDACTONE) 25 MG tablet Take 25 mg by mouth daily.     traMADol  (ULTRAM ) 50 MG tablet Take 1 tablet (50 mg total) by mouth every 6 (six) hours as needed for  severe pain (pain score 7-10). For AFTER surgery only, do not take and drive 10 tablet 0   No current facility-administered medications for this visit.    Allergies as of 01/05/2024 - Review Complete 12/05/2023  Allergen Reaction Noted   Lisinopril   08/17/2010   Metoprolol  06/05/2012   Hydrocodone  Nausea And Vomiting 04/18/2022   Pravastatin sodium  05/24/2010   Oxycodone Nausea And Vomiting 06/01/2017    Family History  Problem Relation Age of Onset   Osteoarthritis Mother    Skin cancer Mother        ? melanoma   Heart failure Mother    CVA Mother 29   Esophageal cancer Father 43   Heart attack Father 84       smoker   Cancer Maternal Grandmother        ? GI   CVA Paternal Grandmother    Diabetes Paternal Grandmother        prediabetic   Deep vein thrombosis Paternal Grandmother    Asthma Son        EIB   Heart attack Maternal Uncle 4   Pancreatic disease Paternal Uncle        unsure if failure or cancer   Stomach cancer Maternal Aunt    Colon cancer Neg Hx    Colon polyps Neg Hx    Rectal cancer Neg Hx    Breast cancer Neg Hx    Endometrial cancer Neg Hx    Ovarian cancer Neg Hx    Prostate cancer Neg Hx     Social History   Socioeconomic History   Marital status: Married    Spouse name: Not on file   Number of children: 4   Years of education: Not on file   Highest education level: Not on file  Occupational History   Not on file  Tobacco Use   Smoking status: Never   Smokeless tobacco: Never  Vaping Use   Vaping status: Never Used  Substance and Sexual Activity   Alcohol use: Yes    Comment: extremely rarely    Drug use: No   Sexual activity: Not on file  Other Topics Concern   Not on file  Social History Narrative   Exercise: hand weight, crunches, some walking   Social Drivers of Health   Financial Resource Strain: Not on file  Food Insecurity: No Food Insecurity (08/24/2022)   Hunger Vital Sign    Worried About Running Out of Food in the Last Year: Never true    Ran Out of Food in the Last Year: Never true  Transportation Needs: No Transportation Needs (08/24/2022)   PRAPARE - Administrator, Civil Service (Medical): No    Lack of Transportation (Non-Medical): No  Physical Activity: Not on file  Stress: Not on file  Social Connections: Socially Integrated (08/24/2022)   Social Connection and Isolation Panel    Frequency of Communication with Friends and Family: More than three times a week    Frequency of Social Gatherings with Friends and Family: Three times a week    Attends Religious Services: 1 to 4 times per year    Active Member of Clubs or Organizations: Yes    Attends Banker Meetings: More than 4 times per year    Marital Status: Married  Catering manager Violence: Not At Risk (08/24/2022)   Humiliation, Afraid, Rape, and Kick questionnaire    Fear of Current or Ex-Partner: No    Emotionally Abused: No  Physically Abused: No    Sexually Abused: No    Review of Systems:  All other review of systems negative except as mentioned in the HPI.  Physical Exam: Vital signs There were no vitals taken for this visit.  General:   Alert,  Well-developed, well-nourished, pleasant and cooperative in NAD Airway:  Mallampati 2 Lungs:  Clear throughout to auscultation.   Heart:  Regular rate and rhythm; no murmurs, clicks, rubs,  or gallops. Abdomen:  Soft, nontender and nondistended. Normal bowel sounds.   Neuro/Psych:  Normal mood and affect. A and O x 3  Inocente Hausen, MD [facility]

## 2024-01-05 ENCOUNTER — Ambulatory Visit: Admitting: Pediatrics

## 2024-01-05 ENCOUNTER — Encounter: Payer: Self-pay | Admitting: Pediatrics

## 2024-01-05 VITALS — BP 113/60 | HR 75 | Temp 97.2°F | Resp 11 | Ht 63.0 in | Wt 130.0 lb

## 2024-01-05 DIAGNOSIS — K2950 Unspecified chronic gastritis without bleeding: Secondary | ICD-10-CM

## 2024-01-05 DIAGNOSIS — Z800 Family history of malignant neoplasm of digestive organs: Secondary | ICD-10-CM

## 2024-01-05 DIAGNOSIS — K219 Gastro-esophageal reflux disease without esophagitis: Secondary | ICD-10-CM | POA: Diagnosis not present

## 2024-01-05 MED ORDER — SODIUM CHLORIDE 0.9 % IV SOLN: 500.0000 mL | Freq: Once | INTRAVENOUS | Status: DC

## 2024-01-05 NOTE — Progress Notes (Signed)
 Called to room to assist during endoscopic procedure.  Patient ID and intended procedure confirmed with present staff. Received instructions for my participation in the procedure from the performing physician.

## 2024-01-05 NOTE — Progress Notes (Signed)
 Sedate, gd SR, tolerated procedure well, VSS, report to RN

## 2024-01-05 NOTE — Patient Instructions (Signed)
   Await results og duodenal, gastric, & esophageal biopsies   YOU HAD AN ENDOSCOPIC PROCEDURE TODAY AT THE [facility]:   Refer to the procedure report that was given to you for any specific questions about what was found during the examination.  If the procedure report does not answer your questions, please call your gastroenterologist to clarify.  If you requested that your care partner not be given the details of your procedure findings, then the procedure report has been included in a sealed envelope for you to review at your convenience later.  YOU SHOULD EXPECT: Some feelings of bloating in the abdomen. Passage of more gas than usual.  Walking can help get rid of the air that was put into your GI tract during the procedure and reduce the bloating. If you had a lower endoscopy (such as a colonoscopy or flexible sigmoidoscopy) you may notice spotting of blood in your stool or on the toilet paper. If you underwent a bowel prep for your procedure, you may not have a normal bowel movement for a few days.  Please Note:  You might notice some irritation and congestion in your nose or some drainage.  This is from the oxygen used during your procedure.  There is no need for concern and it should clear up in a day or so.  SYMPTOMS TO REPORT IMMEDIATELY:   Following upper endoscopy (EGD)  Vomiting of blood or coffee ground material  New chest pain or pain under the shoulder blades  Painful or persistently difficult swallowing  New shortness of breath  Fever of 100F or higher  Black, tarry-looking stools  For urgent or emergent issues, a gastroenterologist can be reached at any hour by calling [phone]. Do not use MyChart messaging for urgent concerns.    DIET:  We do recommend a small meal at first, but then you may proceed to your regular diet.  Drink plenty of fluids but you should avoid alcoholic beverages for 24 hours.  ACTIVITY:  You should plan to take it easy for the  rest of today and you should NOT DRIVE or use heavy machinery until tomorrow (because of the sedation medicines used during the test).    FOLLOW UP: Our staff will call the number listed on your records the next business day following your procedure.  We will call around 7:15- 8:00 am to check on you and address any questions or concerns that you may have regarding the information given to you following your procedure. If we do not reach you, we will leave a message.     If any biopsies were taken you will be contacted by phone or by letter within the next 1-3 weeks.  Please call us  at [phone] if you have not heard about the biopsies in 3 weeks.    SIGNATURES/CONFIDENTIALITY: You and/or your care partner have signed paperwork which will be entered into your electronic medical record.  These signatures attest to the fact that that the information above on your After Visit Summary has been reviewed and is understood.  Full responsibility of the confidentiality of this discharge information lies with you and/or your care-partner.

## 2024-01-05 NOTE — Op Note (Signed)
 [facility] Patient Name: [name] Procedure Date: 01/05/2024 9:27 AM MRN: [id number] Endoscopist: Inocente Hausen , MD, [id number] Age: 83 Referring MD:  Date of Birth: Jul 13, 1940 Gender: Female Account #: [id number] Procedure:                Upper GI endoscopy Indications:              Heartburn, Follow-up of gastro-esophageal reflux                            disease, Failure to respond to medical treatment,                            Follow-up of esophagitis, Regurgitation, History of                            elevated ALT Medicines:                Monitored Anesthesia Care Procedure:                Pre-Anesthesia Assessment:                           - Prior to the procedure, a History and Physical                            was performed, and patient medications and                            allergies were reviewed. The patient's tolerance of                            previous anesthesia was also reviewed. The risks                            and benefits of the procedure and the sedation                            options and risks were discussed with the patient.                            All questions were answered, and informed consent                            was obtained. Prior Anticoagulants: The patient has                            taken no anticoagulant or antiplatelet agents. ASA                            Grade Assessment: III - A patient with severe                            systemic disease. After reviewing the risks and  benefits, the patient was deemed in satisfactory                            condition to undergo the procedure.                           After obtaining informed consent, the endoscope was                            passed under direct vision. Throughout the                            procedure, the patient's blood pressure, pulse, and                            oxygen saturations were monitored  continuously. The                            Endoscope was introduced through the mouth, and                            advanced to the second part of duodenum. The upper                            GI endoscopy was accomplished without difficulty.                            The patient tolerated the procedure well. Scope In: Scope Out: Findings:                 The examined esophagus was normal. Biopsies were                            obtained from the proximal and distal esophagus                            with cold forceps for histology of suspected                            eosinophilic esophagitis.                           The gastric body, gastric antrum, cardia (on                            retroflexion) and gastric fundus (on retroflexion)                            were normal. Biopsies were taken with a cold                            forceps for Helicobacter pylori testing.                           The duodenal bulb and second portion of  the                            duodenum were normal. Biopsies for histology were                            taken with a cold forceps for evaluation of celiac                            disease. Prior EGD reported duodenal diverticulum                            which was not observed on today's exam. Complications:            No immediate complications. Estimated blood loss:                            Minimal. Estimated Blood Loss:     Estimated blood loss was minimal. Impression:               - Normal esophagus.                           - Normal gastric body, antrum, cardia and gastric                            fundus. Biopsied.                           - Normal duodenal bulb and second portion of the                            duodenum. Biopsied.                           - Biopsies were taken with a cold forceps for                            evaluation of eosinophilic esophagitis. Recommendation:           - Discharge patient to  home (ambulatory).                           - Await pathology results.                           - The findings and recommendations were discussed                            with the patient's family.                           - Return to GI clinic in 6 -8 weeks with Dr.                            Suzann or APP.                           -  Patient has a contact number available for                            emergencies. The signs and symptoms of potential                            delayed complications were discussed with the                            patient. Return to normal activities tomorrow.                            Written discharge instructions were provided to the                            patient. Inocente Hausen, MD 01/05/2024 10:01:36 AM This report has been signed electronically.

## 2024-01-08 ENCOUNTER — Telehealth: Payer: Self-pay | Admitting: Lactation Services

## 2024-01-08 NOTE — Telephone Encounter (Signed)
 No answer left voice mail

## 2024-01-09 ENCOUNTER — Ambulatory Visit: Payer: Self-pay | Admitting: Pediatrics

## 2024-01-09 LAB — SURGICAL PATHOLOGY

## 2024-01-15 ENCOUNTER — Other Ambulatory Visit (HOSPITAL_COMMUNITY): Payer: Self-pay | Admitting: Family Medicine

## 2024-01-15 ENCOUNTER — Ambulatory Visit (HOSPITAL_COMMUNITY)
Admission: RE | Admit: 2024-01-15 | Discharge: 2024-01-15 | Disposition: A | Discharge status: Home / Self Care | Source: Ambulatory Visit | Attending: Surgery | Admitting: Surgery

## 2024-01-15 DIAGNOSIS — I10 Essential (primary) hypertension: Secondary | ICD-10-CM

## 2024-02-19 ENCOUNTER — Telehealth: Payer: Self-pay | Admitting: Cardiovascular Disease

## 2024-02-19 NOTE — Telephone Encounter (Signed)
 Patient states she woke up this morning around 3:00 AM and didn't feel good, it felt heavy around my heart. BP at that time was 167/94.  Patient has not yet taken her amlodipine , BP at breakfast was 105/66 then a few minutes ago BP was 115/70.  Patient states she feels normal now. She will recheck her BP around noon and if BP is closer to 130/80 she will take her regular dose of amlodipine .  Patient reports she has been with her husband in the hosipital for the past 3-4 weeks and he recently passed away. She has not been able to sleep well since.  Patient would like to know if it is OK for her to take Sominex to help her sleep, or if Dr. Wonda has other recommendations.  Will forward to Dr. Wonda and Pharm D to review and advise.

## 2024-02-19 NOTE — Telephone Encounter (Signed)
 Pt c/o BP issue: STAT if pt c/o blurred vision, one-sided weakness or slurred speech.  STAT if BP is GREATER than 180/120 TODAY.  STAT if BP is LESS than 90/60 and SYMPTOMATIC TODAY  1. What is your BP concern?   Patient is concerned her BP has been fluctuating  2. Have you taken any BP medication today?  Not yet, will be taking in 30 minutes  3. What are your last 5 BP readings?  167/94 - 3:00 am this morning (11/17) 147/?? - when got up 105/66 - this morning at breakfast   4. Are you having any other symptoms (ex. Dizziness, headache, blurred vision, passed out)?   Indigestion  Patient noted she has acid reflux.  Patient also noted her husband was in the hospital for 3-4 weeks before passing away.

## 2024-02-20 NOTE — Telephone Encounter (Signed)
 I would not recommend taking sominex. It has an antihistamine in it that can cause drowsiness and increase risk of falls.

## 2024-02-21 NOTE — Telephone Encounter (Signed)
 Spoke with pt and explained Melissa's recommendations of not taking Sominex due to side effects. Pt verbalized understanding. Pt stated that her PCP had prescribed her Xanax today and she started taking it this morning. The rx bottle says to take 1 tablet BID PRN. Pt took 1/2 tablet 9:30 AM, 1/2 tablet 2:30 PM. BP was 178/95 at 4:25 PM. BP around 178/90s this AM around 3 AM when she woke up. Highest reading SBP 181 recently. Pt states she has been paranoid about her BP lately so she checks every 3-4 hours. Explained that sometimes frequent BP checks can cause elevated readings. Pt verbalized understanding. Advised pt to keep any eye on BP overnight and to take her second dose of her Xanax as prescribed (1 tablet) around the time she goes to bed tonight. Pt told to call PCP office tomorrow if she feels she is concerned about her Xanax working for her, but also advised pt that it can take a few days to about a week for a new medication to start working. Explained that I will send a message to Dr. Wonda to see if he wants to make any BP medication adjustments in the mean time and we will be in touch with any recommendations. Pt verbalized understanding of plan and had no further questions at this time.

## 2024-02-23 NOTE — Telephone Encounter (Signed)
 I would recommend that she increase amlodipine  to 10 mg daily and decrease BP checks to 3-4 times/week. Checking this often creates a lot of anxiety and further elevates the blood pressure. thanks

## 2024-03-05 NOTE — Progress Notes (Unsigned)
 [facility] Return Visit   Referring Provider Burney Darice CROME, MD [address] [place] [address] [place],  [place] [address]  Primary Care Provider Burney Darice CROME, MD  Patient Profile: [name] is a 83 y.o. female who returns to the [facility] for follow-up of the problem(s) noted below.  Problem List: GERD History of LA grade A esophagitis Constipation Anal fissure History of uncomplicated diverticulitis   History of Present Illness     Discussed the use of AI scribe software for clinical note transcription with the patient, who gave verbal consent to proceed.  History of Present Illness [name] is an 83 year old woman with a past medical history noteworthy for CAD, HLD, osteoarthritis and bilateral ovarian cysts who returns to the gastroenterology office for follow-up of GERD, constipation, anal fissure.  [name] is followed by Dr. Federico and I am seeing her in Dr. Lafonda absence  Current GI Meds  Pantoprazole  40 mg orally daily MiraLAX as needed   Interval History   GERD - At last visit 12/2023 endorsed pressure and discomfort in lower esophagus despite pantoprazole  40 mg orally daily - Endorsed regurgitation but no dysphagia  - EGD 01/2024 endoscopically and histologically normal; mild H. pylori negative gastritis  Constipation and anal fissure  GI Review of Symptoms Significant for {GIROS:[id number]}. Otherwise negative.  General Review of Systems  Review of systems is significant for the pertinent positives and negatives as listed per the HPI.  Full ROS is otherwise negative.  Past Medical History   Past Medical History:  Diagnosis Date   Allergy    Anemia    Arthritis    CAD (coronary artery disease)    Dr Rolan   Cancer [facility]    BCC nose   Cataract    both eyes- surgically  removed   Diverticulosis of colon    GERD (gastroesophageal reflux disease)    Herpes zoster    1997 L flank   Hyperlipidemia     intol. to statins in past   Hypertension    Osteopenia    Dr Leva, Gynecology   Osteopenia    Osteoporosis    Scoliosis    Stenosis (acquired) of bladder neck or vesicourethral orifice    lower back     Past Surgical History   Past Surgical History:  Procedure Laterality Date   CATARACT EXTRACTION  2010   OD  , Dr. Cleatus   CATARACT EXTRACTION W/ INTRAOCULAR LENS IMPLANT Left 02/2014   Vision not significantly improved   COLONOSCOPY  2007   [facility]   CORONARY ANGIOPLASTY WITH STENT PLACEMENT  06/19/2010   2 vessel; Dr Wonda   HYSTEROTOMY  2025   LUMBAR DISC SURGERY  11/03/2014   ROBOTIC ASSISTED TOTAL HYSTERECTOMY WITH BILATERAL SALPINGO OOPHERECTOMY Bilateral 09/13/2023   Procedure: HYSTERECTOMY, TOTAL, ROBOT-ASSISTED, LAPAROSCOPIC, WITH BILATERAL SALPINGO-OOPHORECTOMY;  Surgeon: Viktoria Comer SAUNDERS, MD;  Location: [facility];  Service: Gynecology;  Laterality: Bilateral;   UPPER GASTROINTESTINAL ENDOSCOPY     VARICOSE VEIN SURGERY  1971     Allergies and Medications   Allergies  Allergen Reactions   Lisinopril      Diffuse itching w/o rash or fever   Metoprolol     Shortness of breath   Hydrocodone  Nausea And Vomiting   Pravastatin Sodium     REACTION: muscle pain in arms 2009   Oxycodone Nausea And Vomiting   @MEDSTODAY @  Family His   Family History  Problem Relation Age of Onset  Osteoarthritis Mother    Skin cancer Mother        ? melanoma   Heart failure Mother    CVA Mother 39   Esophageal cancer Father 30   Heart attack Father 50       smoker   Cancer Maternal Grandmother        ? GI   CVA Paternal Grandmother    Diabetes Paternal Grandmother        prediabetic   Deep vein thrombosis Paternal Grandmother    Asthma Son        EIB   Heart attack Maternal Uncle 47   Pancreatic disease Paternal Uncle        unsure if failure or cancer   Stomach cancer Maternal Aunt    Colon cancer Neg Hx    Colon polyps Neg Hx    Rectal cancer Neg Hx     Breast cancer Neg Hx    Endometrial cancer Neg Hx    Ovarian cancer Neg Hx    Prostate cancer Neg Hx    GI Specific Family History: {gifamhx:50061}   Social History   Social History   Tobacco Use   Smoking status: Never   Smokeless tobacco: Never  Vaping Use   Vaping status: Never Used  Substance Use Topics   Alcohol use: Yes    Comment: extremely rarely   Drug use: No   Rayya reports that she has never smoked. She has never used smokeless tobacco. She reports current alcohol use. She reports that she does not use drugs.  Vital Signs and Physical Examination   There were no vitals filed for this visit. There is no height or weight on file to calculate BMI.    General: Well developed, well nourished, no acute distress Head: Normocephalic and atraumatic Eyes: Sclerae anicteric, EOMI Ears: Normal auditory acuity Mouth: No deformities or lesions noted Lungs: Clear throughout to auscultation Heart: Regular rate and rhythm; No murmurs, rubs or bruits Abdomen: Soft, non tender and non distended. No masses, hepatosplenomegaly or hernias noted. Normal Bowel sounds Rectal: Musculoskeletal: Symmetrical with no gross deformities  Pulses:  Normal pulses noted Extremities: No edema or deformities noted Neurological: Alert oriented x 4, grossly nonfocal Psychological:  Alert and cooperative. Normal mood and affect   Review of Data   The following data was reviewed at the time of this encounter:   Laboratory Studies      Latest Ref Rng & Units 09/01/2023    2:01 PM 11/07/2018    3:03 PM 10/22/2018    8:12 PM  CBC  WBC 4.0 - 10.5 K/uL 7.1  5.3  22.1   Hemoglobin 12.0 - 15.0 g/dL 87.3  87.1  86.3   Hematocrit 36.0 - 46.0 % 38.4  38.8  42.6   Platelets 150 - 400 K/uL 307  291.0  280     Lab Results  Component Value Date   LIPASE 29 10/22/2018      Latest Ref Rng & Units 12/11/2023    9:07 AM 09/01/2023    2:01 PM 03/21/2019    8:18 AM  CMP  Glucose 70 - 99 mg/dL 94   879    BUN 6 - 23 mg/dL 18  16    Creatinine 9.59 - 1.20 mg/dL 8.93  8.98    Sodium 864 - 145 mEq/L 138  138    Potassium 3.5 - 5.1 mEq/L 4.3  4.6    Chloride 96 - 112 mEq/L 107  105  CO2 19 - 32 mEq/L 24  27    Calcium  8.4 - 10.5 mg/dL 8.7  9.6    Total Protein 6.0 - 8.3 g/dL 7.1  7.2  6.9   Total Bilirubin 0.2 - 1.2 mg/dL 0.5  0.3  0.4   Alkaline Phos 39 - 117 U/L 36  44  70   AST 0 - 37 U/L 16  16  26    ALT 0 - 35 U/L 6  6  10       Imaging Studies  CTAP 09/03/2023 Increased size of bilateral complex adnexal cystic lesions, suspicious for cystic ovarian neoplasms. Recommend GYN oncology consultation, and consider further evaluation with pelvic MRI without and with contrast.   No evidence of metastatic disease.   Colonic diverticulosis, without radiographic evidence of diverticulitis.  CTAP 12/17/2018 Sigmoid diverticulosis, without evidence of diverticulitis.   Chronic bilateral ovarian cystic lesions. Annual follow-up ultrasound is suggested in a postmenopausal patient.  CTAP 11/13/2018 1. Mild colonic wall thickening extending from the distal descending colon to the rectum, with a moderate amount of sigmoid colon diverticulosis. There is some subtle mesocolon stranding along the sigmoid colon. The appearance could reflect distal colitis or minimal diverticulitis superimposed on diverticulosis. The fairly long segment of wall thickening tends to favor colitis. 2. Chronically stable but notable cystic lesions of both ovaries. These are roughly similar in size back to the MRI of 12/10/2013 and accordingly do not represent aggressive malignancy. If these lesions have never been characterized by pelvic sonography then pelvic sonography would be recommended for baseline dedicated characterization. 3. Impingement at all levels between T12 and S1 due to scoliosis, spondylosis, and degenerative disc disease. 4. Pleural plaque are nodular thickening along the  left hemidiaphragm measuring 2 mm in thickness. 5. Aortic Atherosclerosis (ICD10-I70.0). Coronary atherosclerosis with trace anterior pericardial effusion.   CTAP 10/22/2018 Changes of diverticulitis at the junction of the descending and sigmoid colons.  GI Procedures and Studies  EGD 01/05/2024 Endoscopically normal esophagus, stomach and duodenum Path: Normal esophagus and duodenal biopsies; H. pylori negative mild chronic inactive gastritis  EGD 01/24/2019 LA grade A esophagitis Duodenal diverticulum near papula Otherwise normal EGD  Colonoscopy 06/15/2017 Multiple sigmoid diverticula, otherwise normal  Clinical Impression  It is my clinical impression that Ms. Huge is a 83 y.o. female with;  ***  Plan  *** *** *** *** ***   Planned Follow Up No follow-ups on file.  The patient or caregiver verbalized understanding of the material covered, with no barriers to understanding. All questions were answered. Patient or caregiver is agreeable with the plan outlined above.    It was a pleasure to see My.  If you have any questions or concerns regarding this evaluation, do not hesitate to contact me.  Inocente Hausen, MD [facility]

## 2024-03-06 ENCOUNTER — Ambulatory Visit: Admitting: Pediatrics

## 2024-03-06 ENCOUNTER — Encounter: Payer: Self-pay | Admitting: Pediatrics

## 2024-03-06 VITALS — BP 110/60 | HR 107 | Ht 64.0 in | Wt 130.0 lb

## 2024-03-06 DIAGNOSIS — K5900 Constipation, unspecified: Secondary | ICD-10-CM

## 2024-03-06 DIAGNOSIS — K219 Gastro-esophageal reflux disease without esophagitis: Secondary | ICD-10-CM

## 2024-03-06 DIAGNOSIS — K602 Anal fissure, unspecified: Secondary | ICD-10-CM

## 2024-03-06 NOTE — Patient Instructions (Signed)
 Thank you for entrusting me with your care and for choosing [facility], Dr. Inocente Hausen  _______________________________________________________  If your blood pressure at your visit was 140/90 or greater, please contact your primary care physician to follow up on this.  _______________________________________________________  If you are age 82 or older, your body mass index should be between 23-30. Your Body mass index is 22.31 kg/m. If this is out of the aforementioned range listed, please consider follow up with your Primary Care Provider.  If you are age 35 or younger, your body mass index should be between 19-25. Your Body mass index is 22.31 kg/m. If this is out of the aformentioned range listed, please consider follow up with your Primary Care Provider.   ________________________________________________________  The [facility] providers would like to encourage you to use MYCHART to communicate with providers for non-urgent requests or questions.  Due to long hold times on the telephone, sending your provider a message by [facility] may be a faster and more efficient way to get a response.  Please allow 48 business hours for a response.  Please remember that this is for non-urgent requests.  _______________________________________________________  [facility] is using a team-based approach to care.  Your team is made up of your doctor and two to three APPS. Our APPS (Nurse Practitioners and Physician Assistants) work with your physician to ensure care continuity for you. They are fully qualified to address your health concerns and develop a treatment plan. They communicate directly with your gastroenterologist to care for you. Seeing the Advanced Practice Practitioners on your physician's team can help you by facilitating care more promptly, often allowing for earlier appointments, access to diagnostic testing, procedures, and other specialty referrals.

## 2024-05-09 ENCOUNTER — Encounter: Payer: Self-pay | Admitting: Cardiovascular Disease

## 2024-05-09 ENCOUNTER — Ambulatory Visit: Admitting: Cardiovascular Disease

## 2024-05-09 VITALS — BP 106/66 | HR 92 | Ht 64.0 in | Wt 127.8 lb

## 2024-05-09 DIAGNOSIS — I10 Essential (primary) hypertension: Secondary | ICD-10-CM

## 2024-05-09 DIAGNOSIS — I2510 Atherosclerotic heart disease of native coronary artery without angina pectoris: Secondary | ICD-10-CM

## 2024-05-09 DIAGNOSIS — E782 Mixed hyperlipidemia: Secondary | ICD-10-CM

## 2024-05-09 MED ORDER — NITROGLYCERIN 0.4 MG SL SUBL: 0.4000 mg | SUBLINGUAL_TABLET | SUBLINGUAL | 1 refills | Status: AC | PRN

## 2024-05-09 MED ORDER — AMLODIPINE BESYLATE 5 MG PO TABS: 5.0000 mg | ORAL_TABLET | Freq: Every day | ORAL | 3 refills | Status: AC

## 2024-05-09 NOTE — Patient Instructions (Signed)
 Medication Instructions:  CHANGE Amlodipine  to 5 mg once daily Take an additional 5 mg tablet for systolic blood pressure (top number) greater than 150. *If you need a refill on your cardiac medications before your next appointment, please call your pharmacy*  Lab Work: None ordered today. If you have labs (blood work) drawn today and your tests are completely normal, you will receive your results only by: MyChart Message (if you have MyChart) OR A paper copy in the mail If you have any lab test that is abnormal or we need to change your treatment, we will call you to review the results.  Testing/Procedures: None ordered today.  Follow-Up: At [facility], you and your health needs are our priority.  As part of our continuing mission to provide you with exceptional heart care, our providers are all part of one team.  This team includes your primary Cardiologist (physician) and Advanced Practice Providers or APPs (Physician Assistants and Nurse Practitioners) who all work together to provide you with the care you need, when you need it.  Your next appointment:   1 year(s)  Provider:   Ozell Fell, MD

## 2024-05-09 NOTE — Progress Notes (Signed)
 " Cardiology Office Note:    Date:  05/09/2024   ID:  [name], DOB 02/21/1941, MRN [id number]  PCP:  Burney Darice CROME, MD   [facility] Providers Cardiologist:  Ozell Fell, MD     Referring MD: Burney Darice CROME, MD   Chief Complaint  Patient presents with   Hypertension    History of Present Illness:    [name] is a 84 y.o. female with a hx of coronary artery disease, presenting for follow-up evaluation.  The patient has undergone stenting of the LAD and right coronary arteries in 2012 using drug-eluting stents in each vessel.  She has done well since that time with no recurrent ischemic events.    The patient is here alone today. Her husband passed away in 11/21/25after a fall and neck fracture, followed by a long hospitalization.  Her blood pressure has been labile, but predominantly running low.  She has been skipping her evening dose of amlodipine  for low blood pressure readings.  She has been taking it when her systolic blood pressure is greater than 140 mmHg.  She denies chest pain, chest pressure, or shortness of breath.  She reports that the grieving process is a big part of her blood pressure lability and it has responded well to low-dose alprazolam as needed.  Current Medications: Active Medications[1]   Allergies:   Lisinopril , Metoprolol, Hydrocodone , Pravastatin sodium, and Oxycodone   ROS:   Please see the history of present illness.    All other systems reviewed and are negative.  EKGs/Labs/Other Studies Reviewed:    The following studies were reviewed today: Cardiac Studies & Procedures   ______________________________________________________________________________________________   STRESS TESTS  MYOCARDIAL PERFUSION IMAGING 04/08/2020  Interpretation Summary  The left ventricular ejection fraction is normal (55-65%).  Nuclear stress EF: 65%.  Blood pressure demonstrated a normal response to exercise.  There was no ST  segment deviation noted during stress.  The study is normal.  This is a low risk study.  Normal resting and stress perfusion. No ischemia or infarction EF 64%            ______________________________________________________________________________________________      EKG:   EKG Interpretation Date/Time:  Thursday May 09 2024 08:13:21 EST Ventricular Rate:  92 PR Interval:  142 QRS Duration:  82 QT Interval:  360 QTC Calculation: 445 R Axis:   44  Text Interpretation: Normal sinus rhythm Normal ECG When compared with ECG of 08-May-2023 10:11, No significant change was found Confirmed by Fell Ozell [id number]) on 05/09/2024 8:23:29 AM    Recent Labs: 09/01/2023: Hemoglobin 12.6; Platelet Count 307 12/11/2023: ALT 6; BUN 18; Creatinine, Ser 1.06; Magnesium 2.3; Potassium 4.3; Sodium 138  Recent Lipid Panel    Component Value Date/Time   CHOL 150 03/21/2019 0818   TRIG 103 03/21/2019 0818   TRIG 75 02/08/2006 1223   HDL 64 03/21/2019 0818   CHOLHDL 2.3 03/21/2019 0818   CHOLHDL 3 08/10/2017 0928   VLDL 20.2 08/10/2017 0928   LDLCALC 67 03/21/2019 0818   LDLDIRECT 158.8 05/24/2010 1013     Risk Assessment/Calculations:                Physical Exam:    VS:  BP 106/66 (BP Location: Right Arm)   Pulse 92   Ht 5' 4 (1.626 m)   Wt 127 lb 12.8 oz (58 kg)   SpO2 96%   BMI 21.94 kg/m     Wt Readings from  Last 3 Encounters:  05/09/24 127 lb 12.8 oz (58 kg)  03/06/24 130 lb (59 kg)  01/05/24 130 lb (59 kg)     GEN:  Well nourished, well developed in no acute distress HEENT: Normal NECK: No JVD; No carotid bruits LYMPHATICS: No lymphadenopathy CARDIAC: RRR, no murmurs, rubs, gallops RESPIRATORY:  Clear to auscultation without rales, wheezing or rhonchi  ABDOMEN: Soft, non-tender, non-distended MUSCULOSKELETAL:  No edema; No deformity  SKIN: Warm and dry NEUROLOGIC:  Alert and oriented x 3 PSYCHIATRIC:  Normal affect   Assessment & Plan Coronary  artery disease involving native heart without angina pectoris, unspecified vessel or lesion type No exertional angina noted.  Treated with aspirin, amlodipine , and rosuvastatin .  Clinically stable.  Plan follow-up in 1 year.  EKG reviewed with normal findings as outlined above. Essential hypertension Patient treated with amlodipine .  Advised to take 5 mg daily and only take a second dose when systolic blood pressures greater than 150 mmHg.  Her blood pressure is well-controlled enough that I advised her to monitor only 2 to 3 days/week. Mixed hyperlipidemia Treated with rosuvastatin .  Goal LDL cholesterol is less than 70 mg/dL.  Lipids are followed by her PCP.      Medication Adjustments/Labs and Tests Ordered: Current medicines are reviewed at length with the patient today.  Concerns regarding medicines are outlined above.  Orders Placed This Encounter  Procedures   EKG 12-Lead   Meds ordered this encounter  Medications   amLODipine  (NORVASC ) 5 MG tablet    Sig: Take 1 tablet (5 mg total) by mouth daily. Take an additional 5 mg tablet for systolic blood pressure (top number) greater than 150.    Dispense:  90 tablet    Refill:  3   nitroGLYCERIN  (NITROSTAT ) 0.4 MG SL tablet    Sig: Place 1 tablet (0.4 mg total) under the tongue every 5 (five) minutes as needed for chest pain.    Dispense:  30 tablet    Refill:  1    Patient Instructions  Medication Instructions:  CHANGE Amlodipine  to 5 mg once daily Take an additional 5 mg tablet for systolic blood pressure (top number) greater than 150. *If you need a refill on your cardiac medications before your next appointment, please call your pharmacy*  Lab Work: None ordered today. If you have labs (blood work) drawn today and your tests are completely normal, you will receive your results only by: MyChart Message (if you have MyChart) OR A paper copy in the mail If you have any lab test that is abnormal or we need to change your  treatment, we will call you to review the results.  Testing/Procedures: None ordered today.  Follow-Up: At [facility], you and your health needs are our priority.  As part of our continuing mission to provide you with exceptional heart care, our providers are all part of one team.  This team includes your primary Cardiologist (physician) and Advanced Practice Providers or APPs (Physician Assistants and Nurse Practitioners) who all work together to provide you with the care you need, when you need it.  Your next appointment:   1 year(s)  Provider:   Ozell Fell, MD     Signed, Ozell Fell, MD  05/09/2024 1:24 PM    [facility]     [1]  Current Meds  Medication Sig   acetaminophen  (TYLENOL ) 500 MG tablet Take 1,000 mg by mouth every 6 (six) hours as needed for mild pain (pain score 1-3)  or moderate pain (pain score 4-6).   ALPRAZolam (XANAX) 0.25 MG tablet Take 0.25 mg by mouth 2 (two) times daily as needed. (Patient taking differently: Take 0.25 mg by mouth 2 (two) times daily as needed. Takes 1/2 tablet twice a day)   AMBULATORY NON FORMULARY MEDICATION Medication Name: Diltiazem 2%/Lidocaine  2%   Using your index finger apply a small amount of medication inside the anal opening and to the external anal area twice daily x 6 weeks.   aspirin 81 MG tablet Take 81 mg by mouth daily.   BLACK CURRANT SEED OIL PO Take 535 mg by mouth daily.   Calcium  Citrate-Vitamin D  (CITRUS CALCIUM  1500 + D PO) Take 600 mg by mouth daily.   citalopram (CELEXA) 10 MG tablet Take 10 mg by mouth daily.   clonazePAM (KLONOPIN) 0.5 MG tablet Take 0.25 mg by mouth 2 (two) times daily as needed.   GELATIN PO Take 1,120 mg by mouth daily. 560 mg ea   lidocaine  4 % Place 1 patch onto the skin daily as needed (pain).   Multiple Vitamin (MULTIVITAMIN) capsule Take 1 capsule by mouth daily.  Centrum with vitamin D    ondansetron  (ZOFRAN ) 4 MG tablet Take 4 mg by mouth every 8 (eight)  hours as needed.   pantoprazole  (PROTONIX ) 40 MG tablet Take 40 mg by mouth daily.   Probiotic Product (PROBIOTIC BLEND PO) daily   PROLIA 60 MG/ML SOSY injection Inject 60 mg into the skin every 6 (six) months.   rosuvastatin  (CRESTOR ) 10 MG tablet TAKE 1 TABLET BY MOUTH EVERY DAY   SACCHAROMYCES BOULARDII PO Take 60 mg by mouth daily at 6 (six) AM.   spironolactone (ALDACTONE) 25 MG tablet Take 25 mg by mouth daily.   [DISCONTINUED] amLODipine  (NORVASC ) 5 MG tablet TAKE 1 TABLET (5 MG TOTAL) BY MOUTH DAILY. (Patient taking differently: Take 5 mg by mouth 2 (two) times daily.)   [DISCONTINUED] nitroGLYCERIN  (NITROSTAT ) 0.4 MG SL tablet Place 0.4 mg under the tongue every 5 (five) minutes as needed for chest pain.   "

## 2024-05-09 NOTE — Assessment & Plan Note (Addendum)
 Treated with rosuvastatin .  Goal LDL cholesterol is less than 70 mg/dL.  Lipids are followed by her PCP.

## 2024-05-09 NOTE — Assessment & Plan Note (Addendum)
 No exertional angina noted.  Treated with aspirin, amlodipine , and rosuvastatin .  Clinically stable.  Plan follow-up in 1 year.  EKG reviewed with normal findings as outlined above.
